# Patient Record
Sex: Male | Born: 1935 | Race: Black or African American | Hispanic: No | Smoking: Former smoker
Health system: Southern US, Community
[De-identification: ages and names within clinical notes are randomized; demographics above are authoritative.]

## PROBLEM LIST (undated history)

## (undated) DIAGNOSIS — N183 Chronic kidney disease, stage 3 unspecified: Secondary | ICD-10-CM

## (undated) DIAGNOSIS — I10 Essential (primary) hypertension: Secondary | ICD-10-CM

## (undated) DIAGNOSIS — E785 Hyperlipidemia, unspecified: Secondary | ICD-10-CM

## (undated) DIAGNOSIS — I340 Nonrheumatic mitral (valve) insufficiency: Secondary | ICD-10-CM

## (undated) DIAGNOSIS — F0390 Unspecified dementia without behavioral disturbance: Secondary | ICD-10-CM

## (undated) DIAGNOSIS — K219 Gastro-esophageal reflux disease without esophagitis: Secondary | ICD-10-CM

## (undated) DIAGNOSIS — D649 Anemia, unspecified: Secondary | ICD-10-CM

## (undated) DIAGNOSIS — E119 Type 2 diabetes mellitus without complications: Secondary | ICD-10-CM

## (undated) HISTORY — DX: Essential (primary) hypertension: I10

## (undated) HISTORY — PX: ENDARTERECTOMY: SHX5162

## (undated) HISTORY — DX: Gastro-esophageal reflux disease without esophagitis: K21.9

## (undated) HISTORY — PX: KNEE SURGERY: SHX244

## (undated) HISTORY — DX: Hyperlipidemia, unspecified: E78.5

## (undated) HISTORY — PX: PARTIAL COLECTOMY: SHX5273

## (undated) HISTORY — DX: Unspecified dementia, unspecified severity, without behavioral disturbance, psychotic disturbance, mood disturbance, and anxiety: F03.90

## (undated) HISTORY — DX: Chronic kidney disease, stage 3 (moderate): N18.3

## (undated) HISTORY — DX: Type 2 diabetes mellitus without complications: E11.9

## (undated) HISTORY — PX: SHOULDER SURGERY: SHX246

## (undated) HISTORY — DX: Chronic kidney disease, stage 3 unspecified: N18.30

## (undated) HISTORY — DX: Anemia, unspecified: D64.9

## (undated) HISTORY — DX: Nonrheumatic mitral (valve) insufficiency: I34.0

---

## 2012-05-27 DIAGNOSIS — C189 Malignant neoplasm of colon, unspecified: Secondary | ICD-10-CM | POA: Diagnosis not present

## 2012-07-26 DIAGNOSIS — E119 Type 2 diabetes mellitus without complications: Secondary | ICD-10-CM | POA: Diagnosis not present

## 2012-07-26 DIAGNOSIS — N189 Chronic kidney disease, unspecified: Secondary | ICD-10-CM | POA: Diagnosis not present

## 2012-07-26 DIAGNOSIS — I10 Essential (primary) hypertension: Secondary | ICD-10-CM | POA: Diagnosis not present

## 2012-07-26 DIAGNOSIS — I6350 Cerebral infarction due to unspecified occlusion or stenosis of unspecified cerebral artery: Secondary | ICD-10-CM | POA: Diagnosis not present

## 2012-09-11 DIAGNOSIS — N189 Chronic kidney disease, unspecified: Secondary | ICD-10-CM | POA: Diagnosis not present

## 2012-09-11 DIAGNOSIS — I10 Essential (primary) hypertension: Secondary | ICD-10-CM | POA: Diagnosis not present

## 2012-09-11 DIAGNOSIS — E119 Type 2 diabetes mellitus without complications: Secondary | ICD-10-CM | POA: Diagnosis not present

## 2012-09-11 DIAGNOSIS — E785 Hyperlipidemia, unspecified: Secondary | ICD-10-CM | POA: Diagnosis not present

## 2012-09-14 DIAGNOSIS — R4182 Altered mental status, unspecified: Secondary | ICD-10-CM | POA: Diagnosis not present

## 2012-09-14 DIAGNOSIS — R0989 Other specified symptoms and signs involving the circulatory and respiratory systems: Secondary | ICD-10-CM | POA: Diagnosis not present

## 2012-09-14 DIAGNOSIS — R7989 Other specified abnormal findings of blood chemistry: Secondary | ICD-10-CM | POA: Diagnosis not present

## 2012-09-15 DIAGNOSIS — I509 Heart failure, unspecified: Secondary | ICD-10-CM | POA: Diagnosis present

## 2012-09-15 DIAGNOSIS — Z794 Long term (current) use of insulin: Secondary | ICD-10-CM | POA: Diagnosis not present

## 2012-09-15 DIAGNOSIS — I517 Cardiomegaly: Secondary | ICD-10-CM | POA: Diagnosis not present

## 2012-09-15 DIAGNOSIS — I10 Essential (primary) hypertension: Secondary | ICD-10-CM | POA: Diagnosis not present

## 2012-09-15 DIAGNOSIS — Z87891 Personal history of nicotine dependence: Secondary | ICD-10-CM | POA: Diagnosis not present

## 2012-09-15 DIAGNOSIS — G9341 Metabolic encephalopathy: Secondary | ICD-10-CM | POA: Diagnosis not present

## 2012-09-15 DIAGNOSIS — Z85038 Personal history of other malignant neoplasm of large intestine: Secondary | ICD-10-CM | POA: Diagnosis not present

## 2012-09-15 DIAGNOSIS — I471 Supraventricular tachycardia: Secondary | ICD-10-CM | POA: Diagnosis not present

## 2012-09-15 DIAGNOSIS — I2510 Atherosclerotic heart disease of native coronary artery without angina pectoris: Secondary | ICD-10-CM | POA: Diagnosis present

## 2012-09-15 DIAGNOSIS — R4701 Aphasia: Secondary | ICD-10-CM | POA: Diagnosis not present

## 2012-09-15 DIAGNOSIS — I248 Other forms of acute ischemic heart disease: Secondary | ICD-10-CM | POA: Diagnosis not present

## 2012-09-15 DIAGNOSIS — I428 Other cardiomyopathies: Secondary | ICD-10-CM | POA: Diagnosis not present

## 2012-09-15 DIAGNOSIS — Z923 Personal history of irradiation: Secondary | ICD-10-CM | POA: Diagnosis not present

## 2012-09-15 DIAGNOSIS — R4182 Altered mental status, unspecified: Secondary | ICD-10-CM | POA: Diagnosis not present

## 2012-09-15 DIAGNOSIS — Z8546 Personal history of malignant neoplasm of prostate: Secondary | ICD-10-CM | POA: Diagnosis not present

## 2012-09-15 DIAGNOSIS — E161 Other hypoglycemia: Secondary | ICD-10-CM | POA: Diagnosis not present

## 2012-09-15 DIAGNOSIS — E785 Hyperlipidemia, unspecified: Secondary | ICD-10-CM | POA: Diagnosis not present

## 2012-09-15 DIAGNOSIS — F0280 Dementia in other diseases classified elsewhere without behavioral disturbance: Secondary | ICD-10-CM | POA: Diagnosis present

## 2012-09-15 DIAGNOSIS — R7309 Other abnormal glucose: Secondary | ICD-10-CM | POA: Diagnosis not present

## 2012-09-15 DIAGNOSIS — I5032 Chronic diastolic (congestive) heart failure: Secondary | ICD-10-CM | POA: Diagnosis present

## 2012-09-15 DIAGNOSIS — I214 Non-ST elevation (NSTEMI) myocardial infarction: Secondary | ICD-10-CM | POA: Diagnosis not present

## 2012-09-15 DIAGNOSIS — E876 Hypokalemia: Secondary | ICD-10-CM | POA: Diagnosis not present

## 2012-09-15 DIAGNOSIS — R0989 Other specified symptoms and signs involving the circulatory and respiratory systems: Secondary | ICD-10-CM | POA: Diagnosis not present

## 2012-09-15 DIAGNOSIS — Z9049 Acquired absence of other specified parts of digestive tract: Secondary | ICD-10-CM | POA: Diagnosis not present

## 2012-09-15 DIAGNOSIS — F0390 Unspecified dementia without behavioral disturbance: Secondary | ICD-10-CM | POA: Diagnosis not present

## 2012-09-15 DIAGNOSIS — R079 Chest pain, unspecified: Secondary | ICD-10-CM | POA: Diagnosis not present

## 2012-09-15 DIAGNOSIS — Z7982 Long term (current) use of aspirin: Secondary | ICD-10-CM | POA: Diagnosis not present

## 2012-09-15 DIAGNOSIS — F29 Unspecified psychosis not due to a substance or known physiological condition: Secondary | ICD-10-CM | POA: Diagnosis not present

## 2012-09-15 DIAGNOSIS — G309 Alzheimer's disease, unspecified: Secondary | ICD-10-CM | POA: Diagnosis not present

## 2012-09-19 DIAGNOSIS — F0390 Unspecified dementia without behavioral disturbance: Secondary | ICD-10-CM | POA: Diagnosis not present

## 2012-09-19 DIAGNOSIS — E119 Type 2 diabetes mellitus without complications: Secondary | ICD-10-CM | POA: Diagnosis not present

## 2012-09-19 DIAGNOSIS — R4182 Altered mental status, unspecified: Secondary | ICD-10-CM | POA: Diagnosis not present

## 2012-09-20 DIAGNOSIS — E119 Type 2 diabetes mellitus without complications: Secondary | ICD-10-CM | POA: Diagnosis not present

## 2012-09-20 DIAGNOSIS — F0390 Unspecified dementia without behavioral disturbance: Secondary | ICD-10-CM | POA: Diagnosis not present

## 2012-09-20 DIAGNOSIS — R4182 Altered mental status, unspecified: Secondary | ICD-10-CM | POA: Diagnosis not present

## 2012-09-21 DIAGNOSIS — F0390 Unspecified dementia without behavioral disturbance: Secondary | ICD-10-CM | POA: Diagnosis not present

## 2012-09-21 DIAGNOSIS — R4182 Altered mental status, unspecified: Secondary | ICD-10-CM | POA: Diagnosis not present

## 2012-09-21 DIAGNOSIS — E119 Type 2 diabetes mellitus without complications: Secondary | ICD-10-CM | POA: Diagnosis not present

## 2012-09-23 DIAGNOSIS — R4182 Altered mental status, unspecified: Secondary | ICD-10-CM | POA: Diagnosis not present

## 2012-09-23 DIAGNOSIS — F0390 Unspecified dementia without behavioral disturbance: Secondary | ICD-10-CM | POA: Diagnosis not present

## 2012-09-23 DIAGNOSIS — E119 Type 2 diabetes mellitus without complications: Secondary | ICD-10-CM | POA: Diagnosis not present

## 2012-09-24 DIAGNOSIS — R4182 Altered mental status, unspecified: Secondary | ICD-10-CM | POA: Diagnosis not present

## 2012-09-24 DIAGNOSIS — E119 Type 2 diabetes mellitus without complications: Secondary | ICD-10-CM | POA: Diagnosis not present

## 2012-09-24 DIAGNOSIS — F0390 Unspecified dementia without behavioral disturbance: Secondary | ICD-10-CM | POA: Diagnosis not present

## 2012-09-26 DIAGNOSIS — E876 Hypokalemia: Secondary | ICD-10-CM | POA: Diagnosis not present

## 2012-09-26 DIAGNOSIS — R4182 Altered mental status, unspecified: Secondary | ICD-10-CM | POA: Diagnosis not present

## 2012-09-26 DIAGNOSIS — R413 Other amnesia: Secondary | ICD-10-CM | POA: Diagnosis not present

## 2012-09-26 DIAGNOSIS — F0390 Unspecified dementia without behavioral disturbance: Secondary | ICD-10-CM | POA: Diagnosis not present

## 2012-09-26 DIAGNOSIS — E559 Vitamin D deficiency, unspecified: Secondary | ICD-10-CM | POA: Diagnosis not present

## 2012-09-26 DIAGNOSIS — E119 Type 2 diabetes mellitus without complications: Secondary | ICD-10-CM | POA: Diagnosis not present

## 2012-09-27 DIAGNOSIS — E119 Type 2 diabetes mellitus without complications: Secondary | ICD-10-CM | POA: Diagnosis not present

## 2012-09-27 DIAGNOSIS — F0390 Unspecified dementia without behavioral disturbance: Secondary | ICD-10-CM | POA: Diagnosis not present

## 2012-09-27 DIAGNOSIS — R4182 Altered mental status, unspecified: Secondary | ICD-10-CM | POA: Diagnosis not present

## 2012-09-30 DIAGNOSIS — R4182 Altered mental status, unspecified: Secondary | ICD-10-CM | POA: Diagnosis not present

## 2012-09-30 DIAGNOSIS — F0390 Unspecified dementia without behavioral disturbance: Secondary | ICD-10-CM | POA: Diagnosis not present

## 2012-09-30 DIAGNOSIS — E119 Type 2 diabetes mellitus without complications: Secondary | ICD-10-CM | POA: Diagnosis not present

## 2012-10-01 DIAGNOSIS — R413 Other amnesia: Secondary | ICD-10-CM | POA: Diagnosis not present

## 2012-10-01 DIAGNOSIS — E876 Hypokalemia: Secondary | ICD-10-CM | POA: Diagnosis not present

## 2012-10-01 DIAGNOSIS — E559 Vitamin D deficiency, unspecified: Secondary | ICD-10-CM | POA: Diagnosis not present

## 2012-10-01 DIAGNOSIS — E119 Type 2 diabetes mellitus without complications: Secondary | ICD-10-CM | POA: Diagnosis not present

## 2012-10-02 DIAGNOSIS — F0390 Unspecified dementia without behavioral disturbance: Secondary | ICD-10-CM | POA: Diagnosis not present

## 2012-10-02 DIAGNOSIS — E119 Type 2 diabetes mellitus without complications: Secondary | ICD-10-CM | POA: Diagnosis not present

## 2012-10-02 DIAGNOSIS — R4182 Altered mental status, unspecified: Secondary | ICD-10-CM | POA: Diagnosis not present

## 2012-10-03 DIAGNOSIS — R4182 Altered mental status, unspecified: Secondary | ICD-10-CM | POA: Diagnosis not present

## 2012-10-03 DIAGNOSIS — F0390 Unspecified dementia without behavioral disturbance: Secondary | ICD-10-CM | POA: Diagnosis not present

## 2012-10-03 DIAGNOSIS — E119 Type 2 diabetes mellitus without complications: Secondary | ICD-10-CM | POA: Diagnosis not present

## 2012-10-05 DIAGNOSIS — Z85038 Personal history of other malignant neoplasm of large intestine: Secondary | ICD-10-CM | POA: Diagnosis not present

## 2012-10-05 DIAGNOSIS — Z87891 Personal history of nicotine dependence: Secondary | ICD-10-CM | POA: Diagnosis not present

## 2012-10-05 DIAGNOSIS — I214 Non-ST elevation (NSTEMI) myocardial infarction: Secondary | ICD-10-CM | POA: Diagnosis not present

## 2012-10-05 DIAGNOSIS — I129 Hypertensive chronic kidney disease with stage 1 through stage 4 chronic kidney disease, or unspecified chronic kidney disease: Secondary | ICD-10-CM | POA: Diagnosis present

## 2012-10-05 DIAGNOSIS — E1165 Type 2 diabetes mellitus with hyperglycemia: Secondary | ICD-10-CM | POA: Diagnosis present

## 2012-10-05 DIAGNOSIS — Z8546 Personal history of malignant neoplasm of prostate: Secondary | ICD-10-CM | POA: Diagnosis not present

## 2012-10-05 DIAGNOSIS — D638 Anemia in other chronic diseases classified elsewhere: Secondary | ICD-10-CM | POA: Diagnosis not present

## 2012-10-05 DIAGNOSIS — R112 Nausea with vomiting, unspecified: Secondary | ICD-10-CM | POA: Diagnosis not present

## 2012-10-05 DIAGNOSIS — Z8249 Family history of ischemic heart disease and other diseases of the circulatory system: Secondary | ICD-10-CM | POA: Diagnosis not present

## 2012-10-05 DIAGNOSIS — E785 Hyperlipidemia, unspecified: Secondary | ICD-10-CM | POA: Diagnosis not present

## 2012-10-05 DIAGNOSIS — Z9049 Acquired absence of other specified parts of digestive tract: Secondary | ICD-10-CM | POA: Diagnosis not present

## 2012-10-05 DIAGNOSIS — Z8614 Personal history of Methicillin resistant Staphylococcus aureus infection: Secondary | ICD-10-CM | POA: Diagnosis not present

## 2012-10-05 DIAGNOSIS — I4891 Unspecified atrial fibrillation: Secondary | ICD-10-CM | POA: Diagnosis not present

## 2012-10-05 DIAGNOSIS — I428 Other cardiomyopathies: Secondary | ICD-10-CM | POA: Diagnosis not present

## 2012-10-05 DIAGNOSIS — E876 Hypokalemia: Secondary | ICD-10-CM | POA: Diagnosis present

## 2012-10-05 DIAGNOSIS — I498 Other specified cardiac arrhythmias: Secondary | ICD-10-CM | POA: Diagnosis not present

## 2012-10-05 DIAGNOSIS — I200 Unstable angina: Secondary | ICD-10-CM | POA: Diagnosis not present

## 2012-10-05 DIAGNOSIS — I495 Sick sinus syndrome: Secondary | ICD-10-CM | POA: Diagnosis not present

## 2012-10-05 DIAGNOSIS — I248 Other forms of acute ischemic heart disease: Secondary | ICD-10-CM | POA: Diagnosis not present

## 2012-10-05 DIAGNOSIS — I509 Heart failure, unspecified: Secondary | ICD-10-CM | POA: Diagnosis not present

## 2012-10-05 DIAGNOSIS — N179 Acute kidney failure, unspecified: Secondary | ICD-10-CM | POA: Diagnosis not present

## 2012-10-05 DIAGNOSIS — N189 Chronic kidney disease, unspecified: Secondary | ICD-10-CM | POA: Diagnosis present

## 2012-10-05 DIAGNOSIS — I219 Acute myocardial infarction, unspecified: Secondary | ICD-10-CM | POA: Diagnosis not present

## 2012-10-05 DIAGNOSIS — I2489 Other forms of acute ischemic heart disease: Secondary | ICD-10-CM | POA: Diagnosis not present

## 2012-10-05 DIAGNOSIS — R262 Difficulty in walking, not elsewhere classified: Secondary | ICD-10-CM | POA: Diagnosis not present

## 2012-10-05 DIAGNOSIS — D72829 Elevated white blood cell count, unspecified: Secondary | ICD-10-CM | POA: Diagnosis present

## 2012-10-05 DIAGNOSIS — R42 Dizziness and giddiness: Secondary | ICD-10-CM | POA: Diagnosis not present

## 2012-10-05 DIAGNOSIS — R4182 Altered mental status, unspecified: Secondary | ICD-10-CM | POA: Diagnosis not present

## 2012-10-05 DIAGNOSIS — E860 Dehydration: Secondary | ICD-10-CM | POA: Diagnosis not present

## 2012-10-11 DIAGNOSIS — M6281 Muscle weakness (generalized): Secondary | ICD-10-CM | POA: Diagnosis not present

## 2012-10-11 DIAGNOSIS — R262 Difficulty in walking, not elsewhere classified: Secondary | ICD-10-CM | POA: Diagnosis not present

## 2012-10-11 DIAGNOSIS — E119 Type 2 diabetes mellitus without complications: Secondary | ICD-10-CM | POA: Diagnosis not present

## 2012-10-11 DIAGNOSIS — I2489 Other forms of acute ischemic heart disease: Secondary | ICD-10-CM | POA: Diagnosis not present

## 2012-10-11 DIAGNOSIS — R413 Other amnesia: Secondary | ICD-10-CM | POA: Diagnosis not present

## 2012-10-11 DIAGNOSIS — I4891 Unspecified atrial fibrillation: Secondary | ICD-10-CM | POA: Diagnosis not present

## 2012-10-11 DIAGNOSIS — E785 Hyperlipidemia, unspecified: Secondary | ICD-10-CM | POA: Diagnosis not present

## 2012-10-11 DIAGNOSIS — I214 Non-ST elevation (NSTEMI) myocardial infarction: Secondary | ICD-10-CM | POA: Diagnosis not present

## 2012-10-11 DIAGNOSIS — I248 Other forms of acute ischemic heart disease: Secondary | ICD-10-CM | POA: Diagnosis not present

## 2012-10-11 DIAGNOSIS — I10 Essential (primary) hypertension: Secondary | ICD-10-CM | POA: Diagnosis not present

## 2012-10-11 DIAGNOSIS — I2510 Atherosclerotic heart disease of native coronary artery without angina pectoris: Secondary | ICD-10-CM | POA: Diagnosis not present

## 2012-10-11 DIAGNOSIS — R112 Nausea with vomiting, unspecified: Secondary | ICD-10-CM | POA: Diagnosis not present

## 2012-10-11 DIAGNOSIS — R4182 Altered mental status, unspecified: Secondary | ICD-10-CM | POA: Diagnosis not present

## 2012-10-11 DIAGNOSIS — I428 Other cardiomyopathies: Secondary | ICD-10-CM | POA: Diagnosis not present

## 2012-10-11 DIAGNOSIS — E162 Hypoglycemia, unspecified: Secondary | ICD-10-CM | POA: Diagnosis not present

## 2012-10-15 DIAGNOSIS — I4891 Unspecified atrial fibrillation: Secondary | ICD-10-CM | POA: Diagnosis not present

## 2012-10-15 DIAGNOSIS — I10 Essential (primary) hypertension: Secondary | ICD-10-CM | POA: Diagnosis not present

## 2012-10-15 DIAGNOSIS — I428 Other cardiomyopathies: Secondary | ICD-10-CM | POA: Diagnosis not present

## 2012-10-15 DIAGNOSIS — E119 Type 2 diabetes mellitus without complications: Secondary | ICD-10-CM | POA: Diagnosis not present

## 2012-10-22 DIAGNOSIS — I2510 Atherosclerotic heart disease of native coronary artery without angina pectoris: Secondary | ICD-10-CM | POA: Diagnosis not present

## 2012-10-22 DIAGNOSIS — I10 Essential (primary) hypertension: Secondary | ICD-10-CM | POA: Diagnosis not present

## 2012-10-22 DIAGNOSIS — I4891 Unspecified atrial fibrillation: Secondary | ICD-10-CM | POA: Diagnosis not present

## 2012-10-24 DIAGNOSIS — E162 Hypoglycemia, unspecified: Secondary | ICD-10-CM | POA: Diagnosis not present

## 2012-10-24 DIAGNOSIS — I4891 Unspecified atrial fibrillation: Secondary | ICD-10-CM | POA: Diagnosis not present

## 2012-10-31 DIAGNOSIS — I10 Essential (primary) hypertension: Secondary | ICD-10-CM | POA: Diagnosis not present

## 2012-10-31 DIAGNOSIS — R413 Other amnesia: Secondary | ICD-10-CM | POA: Diagnosis not present

## 2012-11-07 DIAGNOSIS — D649 Anemia, unspecified: Secondary | ICD-10-CM | POA: Diagnosis not present

## 2012-11-07 DIAGNOSIS — E119 Type 2 diabetes mellitus without complications: Secondary | ICD-10-CM | POA: Diagnosis not present

## 2012-11-07 DIAGNOSIS — I129 Hypertensive chronic kidney disease with stage 1 through stage 4 chronic kidney disease, or unspecified chronic kidney disease: Secondary | ICD-10-CM | POA: Diagnosis not present

## 2012-11-07 DIAGNOSIS — I5022 Chronic systolic (congestive) heart failure: Secondary | ICD-10-CM | POA: Diagnosis not present

## 2012-11-07 DIAGNOSIS — Z23 Encounter for immunization: Secondary | ICD-10-CM | POA: Diagnosis not present

## 2012-11-07 DIAGNOSIS — E109 Type 1 diabetes mellitus without complications: Secondary | ICD-10-CM | POA: Diagnosis not present

## 2012-11-07 DIAGNOSIS — I10 Essential (primary) hypertension: Secondary | ICD-10-CM | POA: Diagnosis not present

## 2012-11-07 DIAGNOSIS — N189 Chronic kidney disease, unspecified: Secondary | ICD-10-CM | POA: Diagnosis not present

## 2012-11-07 DIAGNOSIS — I5020 Unspecified systolic (congestive) heart failure: Secondary | ICD-10-CM | POA: Diagnosis not present

## 2012-11-07 DIAGNOSIS — F0280 Dementia in other diseases classified elsewhere without behavioral disturbance: Secondary | ICD-10-CM | POA: Diagnosis not present

## 2012-11-07 DIAGNOSIS — I4891 Unspecified atrial fibrillation: Secondary | ICD-10-CM | POA: Diagnosis not present

## 2012-11-07 DIAGNOSIS — Z79899 Other long term (current) drug therapy: Secondary | ICD-10-CM | POA: Diagnosis not present

## 2012-11-07 DIAGNOSIS — Z0000 Encounter for general adult medical examination without abnormal findings: Secondary | ICD-10-CM | POA: Diagnosis not present

## 2012-11-22 DIAGNOSIS — Z1389 Encounter for screening for other disorder: Secondary | ICD-10-CM | POA: Diagnosis not present

## 2012-11-22 DIAGNOSIS — Z136 Encounter for screening for cardiovascular disorders: Secondary | ICD-10-CM | POA: Diagnosis not present

## 2012-11-22 DIAGNOSIS — I10 Essential (primary) hypertension: Secondary | ICD-10-CM | POA: Diagnosis not present

## 2012-11-22 DIAGNOSIS — E119 Type 2 diabetes mellitus without complications: Secondary | ICD-10-CM | POA: Diagnosis not present

## 2012-11-22 DIAGNOSIS — E785 Hyperlipidemia, unspecified: Secondary | ICD-10-CM | POA: Diagnosis not present

## 2012-12-20 ENCOUNTER — Emergency Department: Payer: Self-pay | Admitting: Emergency Medicine

## 2012-12-20 DIAGNOSIS — R040 Epistaxis: Secondary | ICD-10-CM | POA: Diagnosis not present

## 2012-12-20 DIAGNOSIS — I509 Heart failure, unspecified: Secondary | ICD-10-CM | POA: Diagnosis not present

## 2012-12-20 DIAGNOSIS — Z7982 Long term (current) use of aspirin: Secondary | ICD-10-CM | POA: Diagnosis not present

## 2012-12-20 DIAGNOSIS — I519 Heart disease, unspecified: Secondary | ICD-10-CM | POA: Diagnosis not present

## 2012-12-20 DIAGNOSIS — I517 Cardiomegaly: Secondary | ICD-10-CM | POA: Diagnosis not present

## 2012-12-20 DIAGNOSIS — I5020 Unspecified systolic (congestive) heart failure: Secondary | ICD-10-CM | POA: Diagnosis not present

## 2012-12-20 DIAGNOSIS — I10 Essential (primary) hypertension: Secondary | ICD-10-CM | POA: Diagnosis not present

## 2012-12-20 DIAGNOSIS — E119 Type 2 diabetes mellitus without complications: Secondary | ICD-10-CM | POA: Diagnosis not present

## 2013-01-08 DIAGNOSIS — I10 Essential (primary) hypertension: Secondary | ICD-10-CM | POA: Diagnosis not present

## 2013-01-08 DIAGNOSIS — E119 Type 2 diabetes mellitus without complications: Secondary | ICD-10-CM | POA: Diagnosis not present

## 2013-01-08 DIAGNOSIS — I4891 Unspecified atrial fibrillation: Secondary | ICD-10-CM | POA: Diagnosis not present

## 2013-01-08 DIAGNOSIS — R0609 Other forms of dyspnea: Secondary | ICD-10-CM | POA: Diagnosis not present

## 2013-01-09 ENCOUNTER — Inpatient Hospital Stay: Payer: Self-pay | Admitting: Internal Medicine

## 2013-01-09 DIAGNOSIS — R7881 Bacteremia: Secondary | ICD-10-CM | POA: Diagnosis not present

## 2013-01-09 DIAGNOSIS — D72829 Elevated white blood cell count, unspecified: Secondary | ICD-10-CM | POA: Diagnosis not present

## 2013-01-09 DIAGNOSIS — R197 Diarrhea, unspecified: Secondary | ICD-10-CM | POA: Diagnosis not present

## 2013-01-09 DIAGNOSIS — N183 Chronic kidney disease, stage 3 unspecified: Secondary | ICD-10-CM | POA: Diagnosis not present

## 2013-01-09 DIAGNOSIS — R748 Abnormal levels of other serum enzymes: Secondary | ICD-10-CM | POA: Diagnosis not present

## 2013-01-09 DIAGNOSIS — E876 Hypokalemia: Secondary | ICD-10-CM | POA: Diagnosis not present

## 2013-01-09 DIAGNOSIS — I4891 Unspecified atrial fibrillation: Secondary | ICD-10-CM | POA: Diagnosis not present

## 2013-01-09 DIAGNOSIS — Z9049 Acquired absence of other specified parts of digestive tract: Secondary | ICD-10-CM | POA: Diagnosis not present

## 2013-01-09 DIAGNOSIS — E119 Type 2 diabetes mellitus without complications: Secondary | ICD-10-CM | POA: Diagnosis present

## 2013-01-09 DIAGNOSIS — I472 Ventricular tachycardia: Secondary | ICD-10-CM | POA: Diagnosis not present

## 2013-01-09 DIAGNOSIS — I10 Essential (primary) hypertension: Secondary | ICD-10-CM | POA: Diagnosis not present

## 2013-01-09 DIAGNOSIS — R933 Abnormal findings on diagnostic imaging of other parts of digestive tract: Secondary | ICD-10-CM | POA: Diagnosis not present

## 2013-01-09 DIAGNOSIS — Z833 Family history of diabetes mellitus: Secondary | ICD-10-CM | POA: Diagnosis not present

## 2013-01-09 DIAGNOSIS — K769 Liver disease, unspecified: Secondary | ICD-10-CM | POA: Diagnosis not present

## 2013-01-09 DIAGNOSIS — R55 Syncope and collapse: Secondary | ICD-10-CM | POA: Diagnosis not present

## 2013-01-09 DIAGNOSIS — N400 Enlarged prostate without lower urinary tract symptoms: Secondary | ICD-10-CM | POA: Diagnosis present

## 2013-01-09 DIAGNOSIS — K763 Infarction of liver: Secondary | ICD-10-CM | POA: Diagnosis not present

## 2013-01-09 DIAGNOSIS — I129 Hypertensive chronic kidney disease with stage 1 through stage 4 chronic kidney disease, or unspecified chronic kidney disease: Secondary | ICD-10-CM | POA: Diagnosis present

## 2013-01-09 DIAGNOSIS — E785 Hyperlipidemia, unspecified: Secondary | ICD-10-CM | POA: Diagnosis not present

## 2013-01-09 DIAGNOSIS — K828 Other specified diseases of gallbladder: Secondary | ICD-10-CM | POA: Diagnosis not present

## 2013-01-09 DIAGNOSIS — Z7902 Long term (current) use of antithrombotics/antiplatelets: Secondary | ICD-10-CM | POA: Diagnosis not present

## 2013-01-09 DIAGNOSIS — IMO0002 Reserved for concepts with insufficient information to code with codable children: Secondary | ICD-10-CM | POA: Diagnosis present

## 2013-01-09 DIAGNOSIS — R809 Proteinuria, unspecified: Secondary | ICD-10-CM | POA: Diagnosis not present

## 2013-01-09 DIAGNOSIS — Z794 Long term (current) use of insulin: Secondary | ICD-10-CM | POA: Diagnosis not present

## 2013-01-09 DIAGNOSIS — E875 Hyperkalemia: Secondary | ICD-10-CM | POA: Diagnosis not present

## 2013-01-09 DIAGNOSIS — N179 Acute kidney failure, unspecified: Secondary | ICD-10-CM | POA: Diagnosis not present

## 2013-01-09 DIAGNOSIS — R7989 Other specified abnormal findings of blood chemistry: Secondary | ICD-10-CM | POA: Diagnosis not present

## 2013-01-09 DIAGNOSIS — N19 Unspecified kidney failure: Secondary | ICD-10-CM | POA: Diagnosis not present

## 2013-01-09 DIAGNOSIS — Z7982 Long term (current) use of aspirin: Secondary | ICD-10-CM | POA: Diagnosis not present

## 2013-01-09 DIAGNOSIS — R066 Hiccough: Secondary | ICD-10-CM | POA: Diagnosis not present

## 2013-01-09 LAB — CBC
HCT: 39 % — ABNORMAL LOW (ref 40.0–52.0)
HGB: 13.2 g/dL (ref 13.0–18.0)
MCV: 89 fL (ref 80–100)
Platelet: 206 10*3/uL (ref 150–440)

## 2013-01-09 LAB — CK TOTAL AND CKMB (NOT AT ARMC)
CK, Total: 231 U/L (ref 35–232)
CK-MB: 1 ng/mL (ref 0.5–3.6)
CK-MB: 1.5 ng/mL (ref 0.5–3.6)

## 2013-01-09 LAB — URINALYSIS, COMPLETE
Bilirubin,UR: NEGATIVE
Glucose,UR: 50 mg/dL (ref 0–75)
Leukocyte Esterase: NEGATIVE
Protein: >=500
RBC,UR: 2 /HPF (ref 0–5)
Specific Gravity: 1.014 (ref 1.003–1.030)

## 2013-01-09 LAB — DRUG SCREEN, URINE
Barbiturates, Ur Screen: NEGATIVE (ref ?–200)
Phencyclidine (PCP) Ur S: NEGATIVE (ref ?–25)
Tricyclic, Ur Screen: NEGATIVE (ref ?–1000)

## 2013-01-09 LAB — COMPREHENSIVE METABOLIC PANEL
Albumin: 2.9 g/dL — ABNORMAL LOW (ref 3.4–5.0)
BUN: 20 mg/dL — ABNORMAL HIGH (ref 7–18)
Calcium, Total: 8.7 mg/dL (ref 8.5–10.1)
Creatinine: 1.96 mg/dL — ABNORMAL HIGH (ref 0.60–1.30)
Glucose: 199 mg/dL — ABNORMAL HIGH (ref 65–99)
Osmolality: 284 (ref 275–301)
SGPT (ALT): 1386 U/L — ABNORMAL HIGH (ref 12–78)
Sodium: 138 mmol/L (ref 136–145)
Total Protein: 6.9 g/dL (ref 6.4–8.2)

## 2013-01-09 LAB — BASIC METABOLIC PANEL
Anion Gap: 7 (ref 7–16)
BUN: 19 mg/dL — ABNORMAL HIGH (ref 7–18)
Chloride: 103 mmol/L (ref 98–107)
Co2: 28 mmol/L (ref 21–32)
Creatinine: 2.04 mg/dL — ABNORMAL HIGH (ref 0.60–1.30)
EGFR (African American): 36 — ABNORMAL LOW
EGFR (Non-African Amer.): 31 — ABNORMAL LOW
Glucose: 182 mg/dL — ABNORMAL HIGH (ref 65–99)

## 2013-01-09 LAB — IRON AND TIBC
Iron Bind.Cap.(Total): 245 ug/dL — ABNORMAL LOW (ref 250–450)
Iron: 29 ug/dL — ABNORMAL LOW (ref 65–175)

## 2013-01-09 LAB — PROTIME-INR: INR: 1.4

## 2013-01-09 LAB — ACETAMINOPHEN LEVEL: Acetaminophen: <2 ug/mL

## 2013-01-09 LAB — MAGNESIUM: Magnesium: 1.8 mg/dL

## 2013-01-09 LAB — TROPONIN I: Troponin-I: 0.21 ng/mL — ABNORMAL HIGH

## 2013-01-09 LAB — APTT: Activated PTT: 39.7 secs — ABNORMAL HIGH (ref 23.6–35.9)

## 2013-01-10 LAB — HEPATIC FUNCTION PANEL A (ARMC)
Albumin: 2.5 g/dL — ABNORMAL LOW (ref 3.4–5.0)
Bilirubin, Direct: 2.5 mg/dL — ABNORMAL HIGH (ref 0.00–0.20)
Bilirubin,Total: 3.6 mg/dL — ABNORMAL HIGH (ref 0.2–1.0)
SGOT(AST): 971 U/L — ABNORMAL HIGH (ref 15–37)
SGPT (ALT): 809 U/L — ABNORMAL HIGH (ref 12–78)
Total Protein: 6.2 g/dL — ABNORMAL LOW (ref 6.4–8.2)

## 2013-01-10 LAB — CBC WITH DIFFERENTIAL/PLATELET
Basophil %: 0.1 %
HGB: 12.9 g/dL — ABNORMAL LOW (ref 13.0–18.0)
Lymphocyte %: 2.2 %
MCH: 30.3 pg (ref 26.0–34.0)
MCHC: 34 g/dL (ref 32.0–36.0)
MCV: 89 fL (ref 80–100)
Monocyte %: 5.4 %
Neutrophil %: 92.3 %
Platelet: 199 10*3/uL (ref 150–440)
RBC: 4.25 10*6/uL — ABNORMAL LOW (ref 4.40–5.90)
RDW: 14.3 % (ref 11.5–14.5)

## 2013-01-10 LAB — BASIC METABOLIC PANEL
BUN: 18 mg/dL (ref 7–18)
Calcium, Total: 8.2 mg/dL — ABNORMAL LOW (ref 8.5–10.1)
Creatinine: 1.94 mg/dL — ABNORMAL HIGH (ref 0.60–1.30)
EGFR (African American): 38 — ABNORMAL LOW
EGFR (Non-African Amer.): 33 — ABNORMAL LOW
Osmolality: 279 (ref 275–301)
Sodium: 138 mmol/L (ref 136–145)

## 2013-01-10 LAB — TROPONIN I: Troponin-I: 0.4 ng/mL — ABNORMAL HIGH

## 2013-01-10 LAB — LIPID PANEL
HDL Cholesterol: 34 mg/dL — ABNORMAL LOW (ref 40–60)
Triglycerides: 61 mg/dL (ref 0–200)
VLDL Cholesterol, Calc: 12 mg/dL (ref 5–40)

## 2013-01-10 LAB — CK TOTAL AND CKMB (NOT AT ARMC)
CK, Total: 283 U/L — ABNORMAL HIGH (ref 35–232)
CK-MB: 1.3 ng/mL (ref 0.5–3.6)

## 2013-01-10 LAB — MAGNESIUM: Magnesium: 2.2 mg/dL

## 2013-01-11 LAB — CBC WITH DIFFERENTIAL/PLATELET
Basophil %: 0.1 %
Eosinophil #: 0 10*3/uL (ref 0.0–0.7)
Eosinophil %: 0.3 %
HCT: 35 % — ABNORMAL LOW (ref 40.0–52.0)
Lymphocyte #: 0.3 10*3/uL — ABNORMAL LOW (ref 1.0–3.6)
Lymphocyte %: 3.4 %
MCH: 30.7 pg (ref 26.0–34.0)
Neutrophil #: 8.2 10*3/uL — ABNORMAL HIGH (ref 1.4–6.5)
Neutrophil %: 89 %
RBC: 3.92 10*6/uL — ABNORMAL LOW (ref 4.40–5.90)
RDW: 14.8 % — ABNORMAL HIGH (ref 11.5–14.5)

## 2013-01-11 LAB — COMPREHENSIVE METABOLIC PANEL
Albumin: 2.2 g/dL — ABNORMAL LOW (ref 3.4–5.0)
Anion Gap: 9 (ref 7–16)
Calcium, Total: 8.1 mg/dL — ABNORMAL LOW (ref 8.5–10.1)
Creatinine: 2.24 mg/dL — ABNORMAL HIGH (ref 0.60–1.30)
Glucose: 130 mg/dL — ABNORMAL HIGH (ref 65–99)
Osmolality: 279 (ref 275–301)
Potassium: 2.9 mmol/L — ABNORMAL LOW (ref 3.5–5.1)
Sodium: 138 mmol/L (ref 136–145)
Total Protein: 6 g/dL — ABNORMAL LOW (ref 6.4–8.2)

## 2013-01-11 LAB — PROTEIN / CREATININE RATIO, URINE
Creatinine, Urine: 62.3 mg/dL (ref 30.0–125.0)
Protein, Random Urine: 176 mg/dL — ABNORMAL HIGH (ref 0–12)
Protein/Creat. Ratio: 2825 mg/gCREAT — ABNORMAL HIGH (ref 0–200)

## 2013-01-11 LAB — PHOSPHORUS: Phosphorus: 2.2 mg/dL — ABNORMAL LOW (ref 2.5–4.9)

## 2013-01-11 LAB — PROTIME-INR: Prothrombin Time: 15.4 secs — ABNORMAL HIGH (ref 11.5–14.7)

## 2013-01-11 LAB — URINE CULTURE

## 2013-01-11 LAB — POTASSIUM: Potassium: 3.3 mmol/L — ABNORMAL LOW (ref 3.5–5.1)

## 2013-01-11 LAB — CK: CK, Total: 245 U/L — ABNORMAL HIGH (ref 35–232)

## 2013-01-11 LAB — MAGNESIUM: Magnesium: 2.3 mg/dL

## 2013-01-12 LAB — BASIC METABOLIC PANEL
Anion Gap: 11 (ref 7–16)
Co2: 19 mmol/L — ABNORMAL LOW (ref 21–32)
Glucose: 191 mg/dL — ABNORMAL HIGH (ref 65–99)
Sodium: 134 mmol/L — ABNORMAL LOW (ref 136–145)

## 2013-01-13 LAB — COMPREHENSIVE METABOLIC PANEL
Albumin: 2.2 g/dL — ABNORMAL LOW (ref 3.4–5.0)
Alkaline Phosphatase: 446 U/L — ABNORMAL HIGH (ref 50–136)
Anion Gap: 7 (ref 7–16)
Calcium, Total: 8.5 mg/dL (ref 8.5–10.1)
Chloride: 106 mmol/L (ref 98–107)
EGFR (Non-African Amer.): 33 — ABNORMAL LOW
Glucose: 200 mg/dL — ABNORMAL HIGH (ref 65–99)
Potassium: 4 mmol/L (ref 3.5–5.1)
SGOT(AST): 138 U/L — ABNORMAL HIGH (ref 15–37)
SGPT (ALT): 232 U/L — ABNORMAL HIGH (ref 12–78)
Sodium: 136 mmol/L (ref 136–145)

## 2013-01-13 LAB — BASIC METABOLIC PANEL
Anion Gap: 10 (ref 7–16)
BUN: 12 mg/dL (ref 7–18)
Co2: 20 mmol/L — ABNORMAL LOW (ref 21–32)
Creatinine: 1.74 mg/dL — ABNORMAL HIGH (ref 0.60–1.30)
EGFR (African American): 43 — ABNORMAL LOW
EGFR (Non-African Amer.): 38 — ABNORMAL LOW
Osmolality: 273 (ref 275–301)
Sodium: 134 mmol/L — ABNORMAL LOW (ref 136–145)

## 2013-01-13 LAB — MAGNESIUM: Magnesium: 2.2 mg/dL

## 2013-01-14 LAB — CULTURE, BLOOD (SINGLE)

## 2013-01-27 DIAGNOSIS — D631 Anemia in chronic kidney disease: Secondary | ICD-10-CM | POA: Diagnosis not present

## 2013-01-27 DIAGNOSIS — N183 Chronic kidney disease, stage 3 unspecified: Secondary | ICD-10-CM | POA: Diagnosis not present

## 2013-01-27 DIAGNOSIS — I10 Essential (primary) hypertension: Secondary | ICD-10-CM | POA: Diagnosis not present

## 2013-01-27 DIAGNOSIS — I4891 Unspecified atrial fibrillation: Secondary | ICD-10-CM | POA: Diagnosis not present

## 2013-01-27 DIAGNOSIS — E119 Type 2 diabetes mellitus without complications: Secondary | ICD-10-CM | POA: Diagnosis not present

## 2013-01-27 DIAGNOSIS — N2581 Secondary hyperparathyroidism of renal origin: Secondary | ICD-10-CM | POA: Diagnosis not present

## 2013-01-27 DIAGNOSIS — E782 Mixed hyperlipidemia: Secondary | ICD-10-CM | POA: Diagnosis not present

## 2013-01-27 DIAGNOSIS — N179 Acute kidney failure, unspecified: Secondary | ICD-10-CM | POA: Diagnosis not present

## 2013-01-28 DIAGNOSIS — I4891 Unspecified atrial fibrillation: Secondary | ICD-10-CM | POA: Diagnosis not present

## 2013-01-28 DIAGNOSIS — Z23 Encounter for immunization: Secondary | ICD-10-CM | POA: Diagnosis not present

## 2013-01-28 DIAGNOSIS — I10 Essential (primary) hypertension: Secondary | ICD-10-CM | POA: Diagnosis not present

## 2013-01-28 DIAGNOSIS — E119 Type 2 diabetes mellitus without complications: Secondary | ICD-10-CM | POA: Diagnosis not present

## 2013-02-13 DIAGNOSIS — E1149 Type 2 diabetes mellitus with other diabetic neurological complication: Secondary | ICD-10-CM | POA: Diagnosis not present

## 2013-02-13 DIAGNOSIS — E1142 Type 2 diabetes mellitus with diabetic polyneuropathy: Secondary | ICD-10-CM | POA: Diagnosis not present

## 2013-02-13 DIAGNOSIS — I672 Cerebral atherosclerosis: Secondary | ICD-10-CM | POA: Diagnosis not present

## 2013-02-13 DIAGNOSIS — F0150 Vascular dementia without behavioral disturbance: Secondary | ICD-10-CM | POA: Diagnosis not present

## 2013-02-20 DIAGNOSIS — N179 Acute kidney failure, unspecified: Secondary | ICD-10-CM | POA: Diagnosis not present

## 2013-02-20 DIAGNOSIS — I4891 Unspecified atrial fibrillation: Secondary | ICD-10-CM | POA: Diagnosis not present

## 2013-02-20 DIAGNOSIS — E876 Hypokalemia: Secondary | ICD-10-CM | POA: Diagnosis not present

## 2013-02-20 DIAGNOSIS — I10 Essential (primary) hypertension: Secondary | ICD-10-CM | POA: Diagnosis not present

## 2013-02-20 DIAGNOSIS — R809 Proteinuria, unspecified: Secondary | ICD-10-CM | POA: Diagnosis not present

## 2013-02-28 DIAGNOSIS — M898X9 Other specified disorders of bone, unspecified site: Secondary | ICD-10-CM | POA: Diagnosis not present

## 2013-02-28 DIAGNOSIS — M2040 Other hammer toe(s) (acquired), unspecified foot: Secondary | ICD-10-CM | POA: Diagnosis not present

## 2013-02-28 DIAGNOSIS — B351 Tinea unguium: Secondary | ICD-10-CM | POA: Diagnosis not present

## 2013-02-28 DIAGNOSIS — M79609 Pain in unspecified limb: Secondary | ICD-10-CM | POA: Diagnosis not present

## 2013-02-28 DIAGNOSIS — E119 Type 2 diabetes mellitus without complications: Secondary | ICD-10-CM | POA: Diagnosis not present

## 2013-03-13 DIAGNOSIS — E1329 Other specified diabetes mellitus with other diabetic kidney complication: Secondary | ICD-10-CM | POA: Diagnosis not present

## 2013-03-13 DIAGNOSIS — E1129 Type 2 diabetes mellitus with other diabetic kidney complication: Secondary | ICD-10-CM | POA: Diagnosis not present

## 2013-03-13 DIAGNOSIS — Z23 Encounter for immunization: Secondary | ICD-10-CM | POA: Diagnosis not present

## 2013-03-13 DIAGNOSIS — N058 Unspecified nephritic syndrome with other morphologic changes: Secondary | ICD-10-CM | POA: Diagnosis not present

## 2013-03-13 DIAGNOSIS — Z1331 Encounter for screening for depression: Secondary | ICD-10-CM | POA: Diagnosis not present

## 2013-03-13 DIAGNOSIS — I700 Atherosclerosis of aorta: Secondary | ICD-10-CM | POA: Diagnosis not present

## 2013-03-25 DIAGNOSIS — E876 Hypokalemia: Secondary | ICD-10-CM | POA: Diagnosis not present

## 2013-03-25 DIAGNOSIS — N179 Acute kidney failure, unspecified: Secondary | ICD-10-CM | POA: Diagnosis not present

## 2013-03-25 DIAGNOSIS — I10 Essential (primary) hypertension: Secondary | ICD-10-CM | POA: Diagnosis not present

## 2013-03-28 DIAGNOSIS — E119 Type 2 diabetes mellitus without complications: Secondary | ICD-10-CM | POA: Diagnosis not present

## 2013-05-14 DIAGNOSIS — E782 Mixed hyperlipidemia: Secondary | ICD-10-CM | POA: Diagnosis not present

## 2013-05-14 DIAGNOSIS — E119 Type 2 diabetes mellitus without complications: Secondary | ICD-10-CM | POA: Diagnosis not present

## 2013-05-14 DIAGNOSIS — I10 Essential (primary) hypertension: Secondary | ICD-10-CM | POA: Diagnosis not present

## 2013-05-19 DIAGNOSIS — I10 Essential (primary) hypertension: Secondary | ICD-10-CM | POA: Diagnosis not present

## 2013-05-19 DIAGNOSIS — E785 Hyperlipidemia, unspecified: Secondary | ICD-10-CM | POA: Diagnosis not present

## 2013-05-19 DIAGNOSIS — E1129 Type 2 diabetes mellitus with other diabetic kidney complication: Secondary | ICD-10-CM | POA: Diagnosis not present

## 2013-05-22 DIAGNOSIS — E785 Hyperlipidemia, unspecified: Secondary | ICD-10-CM | POA: Diagnosis not present

## 2013-05-22 DIAGNOSIS — E1129 Type 2 diabetes mellitus with other diabetic kidney complication: Secondary | ICD-10-CM | POA: Diagnosis not present

## 2013-05-22 DIAGNOSIS — N058 Unspecified nephritic syndrome with other morphologic changes: Secondary | ICD-10-CM | POA: Diagnosis not present

## 2013-05-22 DIAGNOSIS — I10 Essential (primary) hypertension: Secondary | ICD-10-CM | POA: Diagnosis not present

## 2013-06-27 DIAGNOSIS — N183 Chronic kidney disease, stage 3 unspecified: Secondary | ICD-10-CM | POA: Diagnosis not present

## 2013-06-27 DIAGNOSIS — E119 Type 2 diabetes mellitus without complications: Secondary | ICD-10-CM | POA: Diagnosis not present

## 2013-06-27 DIAGNOSIS — N2581 Secondary hyperparathyroidism of renal origin: Secondary | ICD-10-CM | POA: Diagnosis not present

## 2013-06-27 DIAGNOSIS — D631 Anemia in chronic kidney disease: Secondary | ICD-10-CM | POA: Diagnosis not present

## 2013-06-27 DIAGNOSIS — N179 Acute kidney failure, unspecified: Secondary | ICD-10-CM | POA: Diagnosis not present

## 2013-06-27 DIAGNOSIS — I10 Essential (primary) hypertension: Secondary | ICD-10-CM | POA: Diagnosis not present

## 2013-06-27 DIAGNOSIS — R809 Proteinuria, unspecified: Secondary | ICD-10-CM | POA: Diagnosis not present

## 2013-07-28 DIAGNOSIS — L851 Acquired keratosis [keratoderma] palmaris et plantaris: Secondary | ICD-10-CM | POA: Diagnosis not present

## 2013-07-28 DIAGNOSIS — E781 Pure hyperglyceridemia: Secondary | ICD-10-CM | POA: Diagnosis not present

## 2013-07-28 DIAGNOSIS — I4891 Unspecified atrial fibrillation: Secondary | ICD-10-CM | POA: Diagnosis not present

## 2013-07-28 DIAGNOSIS — E119 Type 2 diabetes mellitus without complications: Secondary | ICD-10-CM | POA: Diagnosis not present

## 2013-07-28 DIAGNOSIS — I10 Essential (primary) hypertension: Secondary | ICD-10-CM | POA: Diagnosis not present

## 2013-07-28 DIAGNOSIS — B351 Tinea unguium: Secondary | ICD-10-CM | POA: Diagnosis not present

## 2013-08-06 DIAGNOSIS — I4891 Unspecified atrial fibrillation: Secondary | ICD-10-CM | POA: Diagnosis not present

## 2013-08-28 DIAGNOSIS — E1129 Type 2 diabetes mellitus with other diabetic kidney complication: Secondary | ICD-10-CM | POA: Diagnosis not present

## 2013-08-28 DIAGNOSIS — N058 Unspecified nephritic syndrome with other morphologic changes: Secondary | ICD-10-CM | POA: Diagnosis not present

## 2013-08-28 DIAGNOSIS — E785 Hyperlipidemia, unspecified: Secondary | ICD-10-CM | POA: Diagnosis not present

## 2013-08-28 DIAGNOSIS — I10 Essential (primary) hypertension: Secondary | ICD-10-CM | POA: Diagnosis not present

## 2013-09-19 ENCOUNTER — Ambulatory Visit: Payer: Self-pay | Admitting: Family Medicine

## 2013-09-19 DIAGNOSIS — R911 Solitary pulmonary nodule: Secondary | ICD-10-CM | POA: Diagnosis not present

## 2013-09-19 DIAGNOSIS — J438 Other emphysema: Secondary | ICD-10-CM | POA: Diagnosis not present

## 2013-09-19 DIAGNOSIS — J479 Bronchiectasis, uncomplicated: Secondary | ICD-10-CM | POA: Diagnosis not present

## 2013-09-19 DIAGNOSIS — J342 Deviated nasal septum: Secondary | ICD-10-CM | POA: Diagnosis not present

## 2013-09-19 DIAGNOSIS — H6500 Acute serous otitis media, unspecified ear: Secondary | ICD-10-CM | POA: Diagnosis not present

## 2013-09-29 DIAGNOSIS — I4891 Unspecified atrial fibrillation: Secondary | ICD-10-CM | POA: Diagnosis not present

## 2013-09-29 DIAGNOSIS — I10 Essential (primary) hypertension: Secondary | ICD-10-CM | POA: Diagnosis not present

## 2013-09-29 DIAGNOSIS — N058 Unspecified nephritic syndrome with other morphologic changes: Secondary | ICD-10-CM | POA: Diagnosis not present

## 2013-09-29 DIAGNOSIS — E1129 Type 2 diabetes mellitus with other diabetic kidney complication: Secondary | ICD-10-CM | POA: Diagnosis not present

## 2013-10-24 DIAGNOSIS — H903 Sensorineural hearing loss, bilateral: Secondary | ICD-10-CM | POA: Diagnosis not present

## 2013-10-24 DIAGNOSIS — H6500 Acute serous otitis media, unspecified ear: Secondary | ICD-10-CM | POA: Diagnosis not present

## 2013-10-24 DIAGNOSIS — J342 Deviated nasal septum: Secondary | ICD-10-CM | POA: Diagnosis not present

## 2013-12-02 DIAGNOSIS — I5022 Chronic systolic (congestive) heart failure: Secondary | ICD-10-CM | POA: Diagnosis not present

## 2013-12-02 DIAGNOSIS — E119 Type 2 diabetes mellitus without complications: Secondary | ICD-10-CM | POA: Diagnosis not present

## 2013-12-02 DIAGNOSIS — I10 Essential (primary) hypertension: Secondary | ICD-10-CM | POA: Diagnosis not present

## 2013-12-02 DIAGNOSIS — I4891 Unspecified atrial fibrillation: Secondary | ICD-10-CM | POA: Diagnosis not present

## 2013-12-26 DIAGNOSIS — M79609 Pain in unspecified limb: Secondary | ICD-10-CM | POA: Diagnosis not present

## 2013-12-26 DIAGNOSIS — B351 Tinea unguium: Secondary | ICD-10-CM | POA: Diagnosis not present

## 2013-12-26 DIAGNOSIS — E119 Type 2 diabetes mellitus without complications: Secondary | ICD-10-CM | POA: Diagnosis not present

## 2014-01-16 DIAGNOSIS — I10 Essential (primary) hypertension: Secondary | ICD-10-CM | POA: Diagnosis not present

## 2014-01-16 DIAGNOSIS — N2581 Secondary hyperparathyroidism of renal origin: Secondary | ICD-10-CM | POA: Diagnosis not present

## 2014-01-16 DIAGNOSIS — D631 Anemia in chronic kidney disease: Secondary | ICD-10-CM | POA: Diagnosis not present

## 2014-01-16 DIAGNOSIS — E1165 Type 2 diabetes mellitus with hyperglycemia: Secondary | ICD-10-CM | POA: Diagnosis not present

## 2014-01-16 DIAGNOSIS — R809 Proteinuria, unspecified: Secondary | ICD-10-CM | POA: Diagnosis not present

## 2014-01-16 DIAGNOSIS — N183 Chronic kidney disease, stage 3 unspecified: Secondary | ICD-10-CM | POA: Diagnosis not present

## 2014-01-16 DIAGNOSIS — E1129 Type 2 diabetes mellitus with other diabetic kidney complication: Secondary | ICD-10-CM | POA: Diagnosis not present

## 2014-02-06 DIAGNOSIS — E785 Hyperlipidemia, unspecified: Secondary | ICD-10-CM | POA: Diagnosis not present

## 2014-02-06 DIAGNOSIS — N183 Chronic kidney disease, stage 3 unspecified: Secondary | ICD-10-CM | POA: Diagnosis not present

## 2014-02-06 DIAGNOSIS — N058 Unspecified nephritic syndrome with other morphologic changes: Secondary | ICD-10-CM | POA: Diagnosis not present

## 2014-02-06 DIAGNOSIS — E1129 Type 2 diabetes mellitus with other diabetic kidney complication: Secondary | ICD-10-CM | POA: Diagnosis not present

## 2014-02-06 DIAGNOSIS — I4891 Unspecified atrial fibrillation: Secondary | ICD-10-CM | POA: Diagnosis not present

## 2014-02-06 DIAGNOSIS — Z23 Encounter for immunization: Secondary | ICD-10-CM | POA: Diagnosis not present

## 2014-02-06 DIAGNOSIS — I10 Essential (primary) hypertension: Secondary | ICD-10-CM | POA: Diagnosis not present

## 2014-02-06 LAB — LIPID PANEL
CHOLESTEROL: 129 mg/dL (ref 0–200)
HDL: 61 mg/dL (ref 35–70)
LDL CALC: 57 mg/dL
Triglycerides: 54 mg/dL (ref 40–160)

## 2014-05-20 DIAGNOSIS — B351 Tinea unguium: Secondary | ICD-10-CM | POA: Diagnosis not present

## 2014-05-20 DIAGNOSIS — M79675 Pain in left toe(s): Secondary | ICD-10-CM | POA: Diagnosis not present

## 2014-05-20 DIAGNOSIS — M79674 Pain in right toe(s): Secondary | ICD-10-CM | POA: Diagnosis not present

## 2014-06-12 DIAGNOSIS — I5022 Chronic systolic (congestive) heart failure: Secondary | ICD-10-CM | POA: Diagnosis not present

## 2014-06-12 DIAGNOSIS — E119 Type 2 diabetes mellitus without complications: Secondary | ICD-10-CM | POA: Diagnosis not present

## 2014-06-12 DIAGNOSIS — K219 Gastro-esophageal reflux disease without esophagitis: Secondary | ICD-10-CM | POA: Diagnosis not present

## 2014-06-12 DIAGNOSIS — I4891 Unspecified atrial fibrillation: Secondary | ICD-10-CM | POA: Diagnosis not present

## 2014-06-12 DIAGNOSIS — Z9889 Other specified postprocedural states: Secondary | ICD-10-CM | POA: Diagnosis not present

## 2014-06-12 DIAGNOSIS — E1121 Type 2 diabetes mellitus with diabetic nephropathy: Secondary | ICD-10-CM | POA: Diagnosis not present

## 2014-06-12 DIAGNOSIS — N183 Chronic kidney disease, stage 3 (moderate): Secondary | ICD-10-CM | POA: Diagnosis not present

## 2014-06-12 DIAGNOSIS — I10 Essential (primary) hypertension: Secondary | ICD-10-CM | POA: Diagnosis not present

## 2014-06-12 DIAGNOSIS — E785 Hyperlipidemia, unspecified: Secondary | ICD-10-CM | POA: Diagnosis not present

## 2014-08-05 DIAGNOSIS — R809 Proteinuria, unspecified: Secondary | ICD-10-CM | POA: Diagnosis not present

## 2014-08-05 DIAGNOSIS — E1122 Type 2 diabetes mellitus with diabetic chronic kidney disease: Secondary | ICD-10-CM | POA: Diagnosis not present

## 2014-08-05 DIAGNOSIS — I129 Hypertensive chronic kidney disease with stage 1 through stage 4 chronic kidney disease, or unspecified chronic kidney disease: Secondary | ICD-10-CM | POA: Diagnosis not present

## 2014-08-05 DIAGNOSIS — N183 Chronic kidney disease, stage 3 (moderate): Secondary | ICD-10-CM | POA: Diagnosis not present

## 2014-08-05 DIAGNOSIS — N2581 Secondary hyperparathyroidism of renal origin: Secondary | ICD-10-CM | POA: Diagnosis not present

## 2014-09-04 NOTE — Discharge Summary (Signed)
PATIENT NAME:  Justin Manning, Justin Manning MR#:  [id number] DATE OF BIRTH:  10-23-1935  CONSULTATIONS: Cardiology, Dr. Ubaldo Glassing and Dr. Saralyn Pilar; GI, Dr. Rayann Heman and Dr. Candace Cruise; and nephrology, Dr. Juleen China.   DISCHARGE DIAGNOSES: Severe hypokalemia, syncope, acute renal failure with chronic kidney disease, bacteremia, hypertension, diabetes.   CONDITION: Stable.   CODE STATUS: FULL CODE.   HOME MEDICATIONS:  1.  Aspirin 81 mg p.o. daily.  2.  Diltiazem CD 240 mg p.o. once a day.  3.  Crestor 40 mg p.o. at bedtime.  4.  Lantus 50 units subcu at bedtime.  5.  Vitamin B12 1000 mcg p.o. daily.  6.  Augmentin 875 mg/125 mg p.o. every 12 hours for 10 days.  7.  Reglan 10 mg p.o. 4 times a day p.r.n. for hiccups.  8.  Protonix 40 mg p.o. b.i.d.  9.  Hydralazine 50 mg p.o. 4 times a day.  10.  Potassium 40 mEq once a day.   DIET: Low sodium, low fat, low cholesterol diet.   ACTIVITY: As tolerated.   FOLLOW-UP CARE:  1.  Follow up with PCP within 1 to 2 weeks.  2.  Follow up with Dr. Juleen China within 1 week. The patient needs to follow up his potassium. I just give prescirption of potassium 40 mEq for 10 days, but the patient needs to follow up his potassium level with Dr. Juleen China or PCP and may need to adjust potassium dose. If hyperkalemia happens, the patient needs to stop potassium.  3.  Follow up with Dr. Ubaldo Glassing. The patient's Pradaxa was discontinued due to acute renal failure according to Dr. Bethanne Ginger suggestion. The patient may need a follow-up with Dr. Ubaldo Glassing for further recommendations.     REASON FOR ADMISSION: Passed out.   HOSPITAL COURSE: The patient is a 79 year old African American male with a history of Afib on Pradaxa, hypertension, hyperlipidemia, who presented to the ED with a syncopal episode for 2 or 3 minutes.   EEG: The patient has a short burst of wide complex of QRS, suspected V-TACH.   The patient was noted to have a creatinine 1.9, potassium 2 with significant liver function abnormality.  Potassium has been repleted.   For detailed history and physical examination, please refer to the admission note dictated by Dr. Bridgette Habermann.   On admission date, laboratory data showed glucose 199. BNP was 4961. BUN 20, creatinine 1.96, potassium 2.1, chloride 103, anion gap 11, magnesium 1.8, ALT 13.86, AST 2000, alk phos 434. CK 184, troponin 0.21.   CAT scan of head did not show any intracranial process.   ASSESSMENT AND PLAN:  1.  Syncope, which is possibly due to arrhythmia caused by severe hypokalemia. Dr. Ubaldo Glassing examined the patient and suggested the patient has no V-TACH but has a U wave due to hypokalemia. The patient then needed aggressive potassium supplement. Since the patient has acute renal failure on chronic kidney disease, Pradaxa needed to be discontinued.  2.  Severe hypokalemia. The patient continues to have hypokalemia. The patient has been treated with potassium supplement on a daily basis with both p.o. and IV potassium. Dr. Juleen China evaluated the patient and suggested potassium loss may be due to gastrointestinal loss or from kidney. He suggested to continue IV fluids and hold lisinopril. The patient's potassium is 4.0 today. The patient's weakness has much improved. He has no complaints except for hiccups which are possibly due to hypokalemia.  3.  Abnormal liver function tests with marked elevated liver transaminases, possibly due to  ischemic hepatopathy related to syncopal event. The patient's hepatitis work-up is norma, other serology workupis in normal range. The patient's liver function has been improving. The last liver function test showed SGOT decreased to 138, SGPT 232, alk phos 446.  4.  Bacteriemia with leukocytosis. The patient has one bottole of  positive  blood culture for Escherichia coli, sensitive to Rocephin. The patient was treated with Zosyn initially and then changed to Rocephin. The patient will get p.o. Augmentin for 10 days. The patient's white count decreased  from 18.4 to 9.2.  5.  Diabetes. The patient has been treated with sliding scale and Lantus.  6.  Hypertension. The patient has been treated with Cardizem since the lisinopril was discontinued. Hydralazine was added adjusted to 50 mg 4 times daily.  7.  Hiccups. The patient has been treated with Reglan p.r.n. and a proton pump inhibitor.  The patient has no symptoms. His vital signs are stable. Potassium is normal. The patient is clinically stable and will be discharged to home today. I discussed the patient's discharge plan with the patient, the patient's daughter, Dr. Juleen China, the case manager and the nurse.   TIME SPENT: About 45 minutes.   The patient's daughter has many questions. All questions were answered.    ____________________________ Demetrios Loll, MD qc:np D: 01/13/2013 14:49:00 ET T: 01/13/2013 15:16:59 ET JOB#: [id number]  cc: Demetrios Loll, MD, <Dictator> Demetrios Loll MD ELECTRONICALLY SIGNED 01/13/2013 16:15

## 2014-09-04 NOTE — Consult Note (Signed)
PATIENT NAME:  Justin Manning, Justin Manning MR#:  [id number] DATE OF BIRTH:  [birth year]  DATE OF CONSULTATION:  01/09/2013  REFERRING PHYSICIAN:   CONSULTING PHYSICIAN:  Theodore Demark, NP  REASON FOR CONSULTATION:  I appreciate the consult ordered by Dr. Bridgette Habermann for a very pleasant 78 year old African American man with a history of a-fib on Pradaxa, hypertension, hyperlipidemia, who was admitted for a syncopal episode for evaluation of elevated LFTs. The patient states he has never had any liver illnesses, states that he was feeling well until last night when he started feeling badly all over, denies any localized symptoms. He states he fell but cannot remember syncopal episode. I do note AST greater than 2000 and ALT in the 1300s, total bilirubin was 1.9. PT of 17, normal platelets. Ultrasound, acetaminophen level and urine drug screen and hepatitis serology, ANA, ASMA, ceruloplasmin, iron studies pending. I do note elevated troponin with a cardiac consult pending. He states no family history of liver disease, stop drinking alcohol many years ago. Denies illicits, IVDU, risky sexual behavior, tattoos, a history of dialysis, blood transfusion, incarceration, foreign travel, [employer], health care work, a history of jaundice or ascites. Does report that he ate at a seafood restaurant about two weeks ago, states he has been taking 2 tablets of acetaminophen twice daily for various pains. I do note the H and P, he was found to be having some wide-complex tachycardia in runs while on EMS trip. He denies all GI-related complaints.   PAST MEDICAL HISTORY:  Atrial fibrillation, diabetes, hypertension, enlarged prostate, a history of esophageal dilation, hypercholesteremia, colon resection due to polyp, left total knee replacement, right shoulder surgery.   FAMILY HISTORY:  Significant for diabetes. No known GI issues presently.   SOCIAL HISTORY:  No tobacco, quit alcohol as noted. No illicits. Recently moved  from [place] to be near his family, has been establishing primary care in [place].   ALLERGIES:  No known allergies.   HOME MEDICATIONS: 1  ASA 81 mg p.o. daily.  2.  Crestor 40 mg p.o. daily.  3.  Diltiazem 240 mg extended release daily.  4.  Lantus 15 units at bedtime.  5.  Lisinopril 40 mg p.o. daily.  6.  Pradaxa 150 mg p.o. b.i.d.  7.  Vitamin B12 1000 mg p.o. daily.  8.  Acetaminophen 2 tablets of unknown strength b.i.d.   REVIEW OF SYSTEMS:  Ten systems reviewed. Significant for abrasion to right forehead after fall with intermittent headaches and general malaise. Denies all further complaints.   MOST RECENT LABS:  BNP 4961, glucose 199, iron 29, BUN 20, creatinine 1.96, sodium 138, potassium 2.1. He did receive replacement for this. Chloride 103, GFR 38. Iron binding capacity 245, iron saturation 12, osmolality 284, magnesium 1.8, calcium 8.7, total protein 6.9, albumin 2.9, total bilirubin 1.9, ALP 434, AST greater than 2000, ALT 1386, CK 184. CPK-MB 1.0. Troponin 0.21, free thyroxine 1.37, TSH 0.41. WBC 14.8, hemoglobin 13.2, hematocrit 39, platelet count 26. Red cells are normocytic with normal RDW. PT 17, INR 1.4. Stool test was negative for C. diff. His acetaminophen level is back now and is less than 2.   CT head was without any acute intracranial process. CT of the abdomen and pelvis demonstrates some thickening of the distal esophagus. There was no issue in the liver. There is a ventral wall abdominal wall hernia and some diverticulosis without evidence of diverticulitis and no other GI issues. Chest x-ray was without evidence of acute cardiopulmonary  abnormalities.   PHYSICAL EXAMINATION:  VITAL SIGNS:  Most recent vital signs:  Temperature 98, pulse 73, respiratory rate 20, blood pressure 161/73, SAO2 95% on room air.  GENERAL:  A well developed elderly man lying in bed in no acute distress.  HEENT:  Normocephalic. Does have slight abrasion on the right forehead and  smaller on the nasal bridge. There is a slight ecchymotic area underneath the right eye. Sclerae anicteric and clear. There is no other redness, drainage, or inflammation noted.  NECK:  Supple. No JVD, thyromegaly, lymphadenopathy.  CARDIAC:  S1, S2, regular rate and rhythm. No MRG. No appreciable edema.  RESPIRATORY:  Respirations eupneic.  LUNGS:  CTAB.  ABDOMEN:  Somewhat protuberant abdomen. Bowel sounds x 4, very soft, nontender. No guarding, rigidity, peritoneal signs, hepatosplenomegaly, masses or other abnormalities noted.  EXTREMITIES:  MAEW. No clubbing or cyanosis. Sensation appears to be intact.  NEUROLOGIC:  Alert, oriented x 3. Cranial nerves II through XII intact. He does have some mild confusion but is able to converse appropriately. Speech clear. No facial droop.  PSYCHIATRIC:  Pleasant, mood stable, calm.   IMPRESSION AND PLAN:  Elevated LFTs. The differential includes toxins, ischemia and infections. We will await his labs, ultrasound results. Do recommend avoiding liver-harming substances and daily LFTs with PT-INR. We will follow with you. Additionally once his condition improves, it may be worthwhile to discuss possible EGD to evaluate his abnormal CT findings. We will defer at present due to his clinical condition.   Thank you very much for this consult. These services were provided by Stephens November, MSN, [facility], in collaboration with Dr. Arther Dames with whom I have discussed this patient.  ____________________________ Theodore Demark, NP chl:jm D: 01/09/2013 16:46:59 ET T: 01/09/2013 18:11:26 ET JOB#: [id number]  cc: Theodore Demark, NP, <Dictator> [place] SIGNED 01/10/2013 8:52

## 2014-09-04 NOTE — Consult Note (Signed)
Brief Consult Note: Diagnosis: Syncope.   Patient was seen by consultant.   Consult note dictated.   Comments: Appreciate consult for 79 y/o [place] American man with history of DM, alzheimer's, HL, BPH, atrial fibrillation with pradaxa therapy, colon resection due to polyp, TKR, shoulder pinning, for evaluation of elevated LFTs. Patient states he has never had any liver illness. States he was feeling well until last night, when he started feeling bad all over- denies any localized symptoms. States he fell but cannot remember syncopal episode.  Do note AST >2000, ALT 1300s, total bilirubin of 1.9, PT of 17, normal platelets.  [place], acetaminophen level, UDS,  and hepatitis serologies pending.  Troponin elevated- cardiac consult pending States no family hx liver disease, and stopped etoh years ago.  Denies illicits,IVDU, risky sex behaviors, tattoos, hx dialysis/blood transfusions, incarceration, foreign travel, [employer], health care work, history of any jaundice or ascites. Does report he ate at a seafood restaraunt about 2w ago. States he takes acetaminophen 2 tabs bid for various pains.  Denies all GI related complaints. Impression; Elevated LFTs: Diff includes toxins, ischemia, infection. Await labs/US results. Recommend avoiding liver harming substances, daily lfts/pt-inr. Will follow with you.  Electronic Signatures: Stephens November H (NP)  (Signed 28-Aug-14 16:36)  Authored: Brief Consult Note   Last Updated: 28-Aug-14 16:36 by Theodore Demark (NP)

## 2014-09-04 NOTE — Consult Note (Signed)
I have seen and examined Justin Manning and agree with Darrick Meigs [place]'s a/p.   is most likely ischemic hepatopathy related the syncopal event.  Suspect he became hypotensive in the setting of the syncope.   INR nml and no AMS. Will watch these closely.  will pursue other etiolgies.  Tylenol level was < 2 so NAC not indicated.  obtain liver u/s with dopplers, ama,asma, ana, ceruloplasmin, iron studies, hepatitis serologies. monitor dialy liver enzymes and INR. Watch for AMS.  We will contiue to follow.  Electronic Signatures: Arther Dames (MD)  (Signed on 28-Aug-14 17:08)  Authored  Last Updated: 28-Aug-14 17:08 by Arther Dames (MD)

## 2014-09-04 NOTE — Consult Note (Signed)
   Present Illness patient is a 79 year old male with history of reported chronic atrial fibrillation currently treated at [facility] who presented to [facility] after an apparent syncopal episode causing trauma to the right frontal portion of his head. electrocardiogram on arrival in the emergency department was normal sinus rhythm with U waves. Patient apparently had a wide-complex tachycardia in route. Records ar available. Patient was noted to be incontinent of stool. He also was noted tly elevated liver transaminases as well as profound hypokalemia with a serum potassiumof 2.1. His was 1.8. He had a mildly elevated at cardiac troponin level but denied chest pain. EKG revealed no Patient denies diuretic use or excessive diarrhea. He had been taking Tylenol frequently for back pain.e denied illicit drug use.   Physical Exam:  GEN well developed, well nourished   HEENT PERRL, hearing intact to voice   NECK supple   RESP normal resp effort   CARD Regular rate and rhythm  No murmur   ABD denies tenderness  normal BS  no Adominal Mass   LYMPH negative neck   SKIN normal to palpation   NEURO cranial nerves intact, motor/sensory function intact   PSYCH A+O to time, place, person   Review of Systems:  Subjective/Chief Complaint no chest pain, right forehead pain   General: Weakness   Skin: No Complaints   ENT: No Complaints   Eyes: No Complaints   Neck: No Complaints   Respiratory: No Complaints   Cardiovascular: No Complaints   Gastrointestinal: Diarrhea   Genitourinary: No Complaints   Vascular: No Complaints   Musculoskeletal: No Complaints   Neurologic: Fainting   Hematologic: No Complaints   Endocrine: No Complaints   Psychiatric: No Complaints   Review of Systems: All other systems were reviewed and found to be negative   Medications/Allergies Reviewed Medications/Allergies reviewed   EKG:  EKG NSR   Interpretation U waves  present    No Known Allergies:    Impression 79 year old male with history of apparent atrial fibrillation who was admitted after suffering a syncopal episode. In route he was noted to have a wide-complex tachycardia per EMS. Records are currently not available. Patient was noted to have profound hypokalemia with a serum potassium of 2.1. His liver transaminases, bilirubin as well as echo and phosphatase were markedly elevated. Patient also had renal insufficiency with a serum creatinine of 2.01. His magnesium was normal. He had mild serum troponin elevations. Etiology of his transaminitis or hypokalemia is unclear. Certainly excessive diarrhea causing GI potassium loss could be present. Volume depletion could also explain his elevated creatinine. His transaminitis may be secondary to relative hypotension during his event this consistent with shock liver. His syncope is likelserum magnesi   Plan 1. Replete potassium 2. Agree with nephrology evaluation to evaluate the etiology of his hypokalemia 3. Agree with GI evaluation to assess etiology of his transaminitis 4. Continue to rule out for myocardial infarction 5. Would not proceed with functional study or invasive cardiac evaluation and  serum potassium was repleted 6. Further recommendations based on results of beforementioned workup   Electronic Signatures: Teodoro Spray (MD)  (Signed 29-Aug-14 14:37)  Authored: General Aspect/Present Illness, History and Physical Exam, Review of System, EKG , Allergies, Impression/Plan   Last Updated: 29-Aug-14 14:37 by Teodoro Spray (MD)

## 2014-09-04 NOTE — H&P (Signed)
PATIENT NAME:  Justin Manning, Justin Manning MR#:  [id number] DATE OF BIRTH:  [birth year]  DATE OF ADMISSION:  01/09/2013  PRIMARY CARE PHYSICIAN:  At [facility]  REFERRING PHYSICIAN:  Dr. Jasmine December   CHIEF COMPLAINT:  Passed out.  HISTORY OF PRESENT ILLNESS:  The patient is a pleasant 79 year old [place] American male with history of afib on Pradaxa, hypertension, hyperlipidemia, who is recently retired here from [place] and moved to be close to his family. The patient has been establishing care at [facility] and was seen by them yesterday. Of note, the patient also had a recent echocardiogram in August at [facility]. He comes in after experiencing syncope. He felt unwell yesterday, but had nonspecific symptoms. He just told his family that he did not feel good, but did not have any other symptoms or complaints. This morning while walking to the bathroom, he passed out, syncopized. He possibly passed out for 2 or 3 minutes. EMS was called, who told the staff in the ER that ran short bursts of wide complex QRSs, possibly V. tach, but they did not have any rhythm strips. These lasted for a few seconds. There were 2 runs of this. He sustained some abrasions to the face, and he came into the hospital where he was found with a creatinine of 1.9, potassium of 2 and significant liver abnormalities. The case was discussed with Dr. Ubaldo Glassing previously before the troponin had come back.  The potassium is being repleted and hospitalist services were contacted for further evaluation and management.   PAST MEDICAL HISTORY: 1.  Afib, but now appears to be sinus rhythm. 2. History of diabetes.  3. Hypertension. 4.  Enlarged prostate.  5.  History of esophageal dilation.  6.  History of hypercholesterolemia.  7.  History of colon resection. 8.  History of knee surgery.  9.  Right shoulder surgery.   ALLERGIES:  No known drug allergies.   FAMILY HISTORY:  Diabetes.   SOCIAL HISTORY: No tobacco. Used to be a drinker, but quit  about 15 to 20 years ago. No other drug use. He is retired, recently moved from [place].   OUTPATIENT MEDICATIONS: Aspirin 81 mg daily, Crestor 40 mg daily, diltiazem extended-release 240 mg daily one tab, Lantus 15 units once a day, lisinopril 40 mg daily, Pradaxa 150 mg one cap 2 times a day, vitamin B 12 1000 mcg daily.  REVIEW OF SYSTEMS:  CONSTITUTIONAL: No fever, but had some chills this morning. He feels like he possibly lost some weight in the last few months.  EYES:  No blurry vision or double vision.  ENT: No tinnitus or hearing loss. No postnasal drip.  RESPIRATORY: No cough, wheezing, shortness of breath, hemoptysis, COPD or painful respirations.  CARDIOVASCULAR: Denies chest pains. Has some positional dizziness. No orthopnea or swelling in the legs. Has history of afib. No history of MI or CHF per patient. No palpitations.  GASTROINTESTINAL: Denies nausea, vomiting, diarrhea, abdominal pain, hematemesis, bloody stools, dark stools or ulcers.  GENITOURINARY: Denies dysuria, hematuria or frequency. Has enlarged prostate.  ENDOCRINE: Denies polyuria, nocturia. HEMATOLOGIC AND LYMPHATIC:  Denies anemia or easy bruising.  SKIN: Denies any new rashes.  MUSCULOSKELETAL:  Has arthritis in the right knee. NEUROLOGIC: Denies focal weakness, numbness, stroke or seizures, but he was found with urine and stool incontinence when he passed out.  PSYCHIATRIC:  Denies anxiety or insomnia.   PHYSICAL EXAMINATION: VITAL SIGNS: Temperature on arrival 99.4, pulse rate 78, respiratory rate 18. Blood pressure on arrival 160/70,  O2 sat 94% on room air.  GENERAL: The patient is a well-developed [place] American male lying in bed, no obvious distress.  HEENT:  The patient has a slight abrasion on the right forehead and a smaller one on nasal bridge, but otherwise appears to be normocephalic, atraumatic. Pupils are equal and reactive. Anicteric sclerae. Extraocular muscles intact. Moist mucous membranes.   NECK:  Supple. No thyroid tenderness. No cervical lymphadenopathy. No JVD.  CARDIOVASCULAR:  S1, S2. Regular rate and rhythm. No murmurs, rubs or gallops.  LUNGS:  Clear to auscultation without wheezing, rhonchi or rales.  ABDOMEN:  Soft, nontender, nondistended, but hyperactive bowel sounds in all quadrants.  EXTREMITIES:  No significant lower extremity edema.  NEUROLOGIC: Cranial nerves appear to be intact, II to XII. Strength is 5 out of 5 all extremities.  PSYCHIATRIC:  Awake, alert, oriented x 2. He is confused about the year.  LABORATORY DATA:  Glucose was 199. BNP is 4961, BUN 20, creatinine 1.96, sodium 138. Potassium is 2.1, chloride 103, GFR of 38, anion gap 11, magnesium 1.8, total albumin is 2.9, bilirubin 1.9, alk phos 434. AST greater than 2000. ALT is 1386. CK total 184. Troponin 0.21. CK-MB is 1. TSH is 0.41. WBC is 14.8. Hemoglobin is 13.2. Platelets are 206. INR is 1.4. PT is 17.  EKG shows normal sinus rhythm, nonspecific T-wave changes. There are some ST depressions on V6, and this very extended and the QTc is severely prolonged at 633 milliseconds corrected.   CT of the head without contrast shows no acute intracranial process.   ASSESSMENT AND PLAN: We have a 79 year old African American male who recently moved from [place] with history of atrial fibrillation on Pradaxa, currently appears to be sinus rhythm, history of possible early dementia, hypertension, hyperlipidemia, feeling unwell for the past day or so who had a syncopal episode with loss of consciousness, as well as urinary and fecal incontinence who has severe electrolyte derangements, renal failure, hepatic failure and severe prolonged QTc of about 633 milliseconds. He will be admitted to the hospital. The syncope possibly could be multifactorial, as well as possibly cardiogenic with short bursts of ventricular tachycardia, as well as renal failure and hyperkalemia as causal agents as well. QTc is prolonged, which  could have caused the patient to pass out as well. This is being corrected. I have ordered stats oral and discussed the case with cardiologist, as well as the Emergency Room physician. We will monitor him on a remote telemetry bed for any further arrhythmias, but he is a high-risk candidate for developing further syncope and cardiogenic problems. His magnesium is not low. I will go ahead and recheck another potassium this afternoon. He has had a recent echocardiogram in [facility] and I will go ahead and get those results. Would try to correct the potassium today, if possible. Other possible infections as possible underlying infection versus seizure activity. He has a white blood cell count and a low-grade fever. We will go ahead and get blood cultures, urine cultures and follow with x-ray of the chest. He has no upper respiratory infection, urinary tract infection or gastrointestinal symptoms. Would obtain an EEG to look for seizure activity but, per family, there was no shaking activity except that there was a urinary and fecal incontinence.  He also has renal failure, unclear if this is acute or chronic. We would hold ACE inhibitor, start him on some gentle fluids and consider nephrology consult if this does not improve. He has severe elevation of  liver enzymes, which appears to be more of mixed function with elevated alkaline phosphatase and bilirubin, but his AST, ALT is severely elevated. The patient did say that he was taking about a gram of Tylenol for the last couple of months, and also he is on a statin. I do not know if this is acute shock liver or this is as a result of medications. Underlying infection is not included. Would go ahead and get a CAT scan of abdomen and pelvis for better evaluation and I will go ahead and obtain a GI consult. We would hold the Tylenol, of course, and also hold the statin at this point and follow another hepatic panel in the morning.  He does have positive troponin. He denies  having any chest pains. I would go ahead and start the patient on aspirin. He is not on a beta blocker and with the severe QTc prolongation, I would be cautious with starting one at this point, but I would monitor the troponins and follow him clinically. Of note, he has no pains in the chest at this point. We would obtain recent labs and  echocardiogram from [facility], if possible. He is starting to establish care at [facility] cardiology.  He has a low TSH, and I will go ahead and check a free thyroxine.  I do not know if the acute renal failure is his baseline or this is all new, and I would follow the creatinine as well. Currently, he appears to be in stage III renal failure. He has elevated BNP of close to 5000, but he does not appear to be volume overloaded at all. We will see what the echocardiogram shows. He appears to have systemic inflammatory response syndrome criteria with leukocytosis and low-grade fevers. Would follow with the cultures.  Overall, the patient is critically ill with renal failure, severe electrolyte abnormalities, syncope and is at a high risk for having further cardiac events such as ventricular fibrillation. Would monitor him carefully and replete the potassium aggressively.  THE PATIENT IS FULL CODE.  TOTAL TIME SPENT:  65 minutes    ____________________________ Vivien Presto, MD sa:ce D: 01/09/2013 12:46:01 ET T: 01/09/2013 13:28:48 ET JOB#: [id number]  cc: Vivien Presto, MD, <Dictator> Vivien Presto MD ELECTRONICALLY SIGNED 02/04/2013 13:50

## 2014-09-04 NOTE — Consult Note (Signed)
Brief Consult Note: Diagnosis: syncope with reported wide complex tachycardia enroute. Has history of afib on pradaxa. Now in nsr. Severe hypokalemia with ekg showing U waves. QTc reading on ekg tracing is including the u wave in the qtc.   Patient was seen by consultant.   Recommend further assessment or treatment.   Orders entered.   Discussed with Attending MD.   Comments: 79 yo male with hisotry of afib by report currently followed in [place] after moving form [place] several months ago. Was on Pradaxa. Presetned after suffering a fall with syncope. WIde complex tachycardia reported by ems enroute. In er Markedly elevated liver transaminases and severe hypokalemia at 2.1 u waves noted on ekg. Arrythmia likley secondary to profound hypokalemia. Will aggressively replete K and keep magnesium greater than 2.0. Hepatology and nephrology conslut to determine etiology of transaminitis and acte renal insuffiency and hypokalemia. Elevated tropoinin of unclear etiology . No chest pain or ischemic changes on ekg. May also be due to arrtyhmia with increased demand vs hypokalemia. Ischemia may also be present but clinically not apparent. FUll note to follow..  Electronic Signatures: Teodoro Spray (MD)  (Signed 28-Aug-14 16:59)  Authored: Brief Consult Note   Last Updated: 28-Aug-14 16:59 by Teodoro Spray (MD)

## 2014-09-04 NOTE — Consult Note (Signed)
Chief Complaint:  Subjective/Chief Complaint Covering for Dr. Rayann Heman. No GI complaints. LFT continues to improve. Cr worsening though.   VITAL SIGNS/ANCILLARY NOTES: **Vital Signs.:   30-Aug-14 08:11  Vital Signs Type Routine  Temperature Temperature (F) 100.9  Celsius 38.2  Pulse Pulse 66  Respirations Respirations 18  Systolic BP Systolic BP 622  Diastolic BP (mmHg) Diastolic BP (mmHg) 66  Mean BP 104  Pulse Ox % Pulse Ox % 97  Pulse Ox Activity Level  At rest  Oxygen Delivery Room Air/ 21 %   Brief Assessment:  GEN no acute distress   Cardiac Regular   Respiratory clear BS   Gastrointestinal Normal   Lab Results: Hepatic:  30-Aug-14 04:20   Bilirubin, Total  3.6  Alkaline Phosphatase  244  SGPT (ALT)  409  SGOT (AST)  254  Total Protein, Serum  6.0  Albumin, Serum  2.2  Routine Chem:  30-Aug-14 04:20   Phosphorus, Serum  2.2 (Result(s) reported on 11 Jan 2013 at 05:56AM.)  Glucose, Serum  130  BUN 17  Creatinine (comp)  2.24  Sodium, Serum 138  Potassium, Serum  2.9  Chloride, Serum 107  CO2, Serum 22  Calcium (Total), Serum  8.1  Osmolality (calc) 279  eGFR (African American)  32  eGFR (Non-African American)  28 (eGFR values <76m/min/1.73 m2 may be an indication of chronic kidney disease (CKD). Calculated eGFR is useful in patients with stable renal function. The eGFR calculation will not be reliable in acutely ill patients when serum creatinine is changing rapidly. It is not useful in  patients on dialysis. The eGFR calculation may not be applicable to patients at the low and high extremes of body sizes, pregnant women, and vegetarians.)  Anion Gap 9  Cardiac:  30-Aug-14 04:20   CK, Total  245 (Result(s) reported on 11 Jan 2013 at 05:56AM.)  Routine Coag:  30-Aug-14 04:20   Prothrombin  15.4  INR 1.2 (INR reference interval applies to patients on anticoagulant therapy. A single INR therapeutic range for coumarins is not optimal for  all indications; however, the suggested range for most indications is 2.0 - 3.0. Exceptions to the INR Reference Range may include: Prosthetic heart valves, acute myocardial infarction, prevention of myocardial infarction, and combinations of aspirin and anticoagulant. The need for a higher or lower target INR must be assessed individually. Reference: The Pharmacology and Management of the Vitamin K  antagonists: the seventh ACCP Conference on Antithrombotic and Thrombolytic Therapy. CWLNLG.9211Sept:126 (3suppl): [id number] A HCT value >55% may artifactually increase the PT.  In one study,  the increase was an average of 25%. Reference:  "Effect on Routine and Special Coagulation Testing Values of Citrate Anticoagulant Adjustment in Patients with High HCT Values." American Journal of Clinical Pathology 2006;126:400-405.)  Routine Hem:  30-Aug-14 04:20   WBC (CBC) 9.2  RBC (CBC)  3.92  Hemoglobin (CBC)  12.1  Hematocrit (CBC)  35.0  Platelet Count (CBC) 176  MCV 89  MCH 30.7  MCHC 34.5  RDW  14.8  Neutrophil % 89.0  Lymphocyte % 3.4  Monocyte % 7.2  Eosinophil % 0.3  Basophil % 0.1  Neutrophil #  8.2  Lymphocyte #  0.3  Monocyte # 0.7  Eosinophil # 0.0  Basophil # 0.0 (Result(s) reported on 11 Jan 2013 at 05:34AM.)   Assessment/Plan:  Assessment/Plan:  Assessment LFT improving. Prob due to ischemia.   Plan Expect LFT to continue to improve. WIll sign off. Call [facility] if LFT  worsens again. Otherwise, patient can f/u with Dr. Rayann Heman after discharge. Thanks.   Electronic Signatures: Verdie Shire (MD)  (Signed 30-Aug-14 10:15)  Authored: Chief Complaint, VITAL SIGNS/ANCILLARY NOTES, Brief Assessment, Lab Results, Assessment/Plan   Last Updated: 30-Aug-14 10:15 by Verdie Shire (MD)

## 2014-10-13 DIAGNOSIS — E785 Hyperlipidemia, unspecified: Secondary | ICD-10-CM | POA: Diagnosis not present

## 2014-10-13 DIAGNOSIS — I10 Essential (primary) hypertension: Secondary | ICD-10-CM | POA: Diagnosis not present

## 2014-10-13 DIAGNOSIS — I519 Heart disease, unspecified: Secondary | ICD-10-CM | POA: Diagnosis not present

## 2014-10-13 DIAGNOSIS — I700 Atherosclerosis of aorta: Secondary | ICD-10-CM | POA: Diagnosis not present

## 2014-10-13 DIAGNOSIS — N183 Chronic kidney disease, stage 3 (moderate): Secondary | ICD-10-CM | POA: Diagnosis not present

## 2014-10-13 DIAGNOSIS — E1121 Type 2 diabetes mellitus with diabetic nephropathy: Secondary | ICD-10-CM | POA: Diagnosis not present

## 2014-10-13 LAB — HEMOGLOBIN A1C: HEMOGLOBIN A1C: 9.6 % — AB (ref 4.0–6.0)

## 2014-11-09 ENCOUNTER — Encounter: Payer: Self-pay | Admitting: Family Medicine

## 2014-11-09 DIAGNOSIS — Z85038 Personal history of other malignant neoplasm of large intestine: Secondary | ICD-10-CM | POA: Insufficient documentation

## 2014-11-09 DIAGNOSIS — I4891 Unspecified atrial fibrillation: Secondary | ICD-10-CM | POA: Insufficient documentation

## 2014-11-09 DIAGNOSIS — Z9889 Other specified postprocedural states: Secondary | ICD-10-CM | POA: Insufficient documentation

## 2014-11-09 DIAGNOSIS — E1169 Type 2 diabetes mellitus with other specified complication: Secondary | ICD-10-CM | POA: Insufficient documentation

## 2014-11-09 DIAGNOSIS — R911 Solitary pulmonary nodule: Secondary | ICD-10-CM | POA: Insufficient documentation

## 2014-11-09 DIAGNOSIS — E785 Hyperlipidemia, unspecified: Secondary | ICD-10-CM

## 2014-11-09 DIAGNOSIS — D638 Anemia in other chronic diseases classified elsewhere: Secondary | ICD-10-CM | POA: Insufficient documentation

## 2014-11-09 DIAGNOSIS — Z8546 Personal history of malignant neoplasm of prostate: Secondary | ICD-10-CM | POA: Insufficient documentation

## 2014-11-09 DIAGNOSIS — N183 Chronic kidney disease, stage 3 unspecified: Secondary | ICD-10-CM | POA: Insufficient documentation

## 2014-11-09 DIAGNOSIS — I071 Rheumatic tricuspid insufficiency: Secondary | ICD-10-CM | POA: Insufficient documentation

## 2014-11-09 DIAGNOSIS — H9192 Unspecified hearing loss, left ear: Secondary | ICD-10-CM | POA: Insufficient documentation

## 2014-11-09 DIAGNOSIS — I779 Disorder of arteries and arterioles, unspecified: Secondary | ICD-10-CM | POA: Insufficient documentation

## 2014-11-09 DIAGNOSIS — I739 Peripheral vascular disease, unspecified: Secondary | ICD-10-CM

## 2014-11-09 DIAGNOSIS — F09 Unspecified mental disorder due to known physiological condition: Secondary | ICD-10-CM | POA: Insufficient documentation

## 2014-11-09 DIAGNOSIS — H6990 Unspecified Eustachian tube disorder, unspecified ear: Secondary | ICD-10-CM | POA: Insufficient documentation

## 2014-11-09 DIAGNOSIS — I5030 Unspecified diastolic (congestive) heart failure: Secondary | ICD-10-CM | POA: Insufficient documentation

## 2014-11-09 DIAGNOSIS — K219 Gastro-esophageal reflux disease without esophagitis: Secondary | ICD-10-CM | POA: Insufficient documentation

## 2014-11-09 DIAGNOSIS — E1122 Type 2 diabetes mellitus with diabetic chronic kidney disease: Secondary | ICD-10-CM | POA: Insufficient documentation

## 2014-11-09 DIAGNOSIS — I340 Nonrheumatic mitral (valve) insufficiency: Secondary | ICD-10-CM | POA: Insufficient documentation

## 2014-11-09 DIAGNOSIS — IMO0002 Reserved for concepts with insufficient information to code with codable children: Secondary | ICD-10-CM | POA: Insufficient documentation

## 2014-11-09 DIAGNOSIS — I10 Essential (primary) hypertension: Secondary | ICD-10-CM | POA: Insufficient documentation

## 2014-11-09 DIAGNOSIS — Z8619 Personal history of other infectious and parasitic diseases: Secondary | ICD-10-CM | POA: Insufficient documentation

## 2014-11-09 DIAGNOSIS — E1165 Type 2 diabetes mellitus with hyperglycemia: Secondary | ICD-10-CM

## 2014-11-09 DIAGNOSIS — E1129 Type 2 diabetes mellitus with other diabetic kidney complication: Secondary | ICD-10-CM | POA: Insufficient documentation

## 2014-11-09 DIAGNOSIS — I517 Cardiomegaly: Secondary | ICD-10-CM | POA: Insufficient documentation

## 2014-11-09 DIAGNOSIS — H6980 Other specified disorders of Eustachian tube, unspecified ear: Secondary | ICD-10-CM | POA: Insufficient documentation

## 2014-11-09 DIAGNOSIS — I700 Atherosclerosis of aorta: Secondary | ICD-10-CM | POA: Insufficient documentation

## 2014-11-12 ENCOUNTER — Ambulatory Visit (INDEPENDENT_AMBULATORY_CARE_PROVIDER_SITE_OTHER): Payer: Medicare Other | Admitting: Family Medicine

## 2014-11-12 ENCOUNTER — Encounter: Payer: Self-pay | Admitting: Family Medicine

## 2014-11-12 ENCOUNTER — Encounter (INDEPENDENT_AMBULATORY_CARE_PROVIDER_SITE_OTHER): Payer: Self-pay

## 2014-11-12 VITALS — BP 124/72 | HR 87 | Temp 98.5°F | Resp 16 | Ht 65.0 in | Wt 204.6 lb

## 2014-11-12 DIAGNOSIS — IMO0002 Reserved for concepts with insufficient information to code with codable children: Secondary | ICD-10-CM

## 2014-11-12 DIAGNOSIS — E1129 Type 2 diabetes mellitus with other diabetic kidney complication: Secondary | ICD-10-CM

## 2014-11-12 DIAGNOSIS — E1165 Type 2 diabetes mellitus with hyperglycemia: Secondary | ICD-10-CM

## 2014-11-12 NOTE — Progress Notes (Signed)
Name: Justin Manning   MRN: [id number]    DOB: Jun 21, 1935   Date:11/12/2014       Progress Note  Subjective  Chief Complaint  Chief Complaint  Patient presents with  . Follow-up    1 month  . Diabetes    Patient is currently in the donut hole. Patient states that he cannot afford his medication.     HPI  DMII: he did not start Novolog because he is in the donut hole and can't afford to pay for medications, however the patient stopped eating bread and taking Ensure and glucose has been at goal around 120, He denies polyphagia, polydipsia or polyuria. Last hgbA1C 9.6 one month ago.  Patient Active Problem List   Diagnosis Date Noted  . Anemia in chronic illness 11/09/2014  . Abdominal aortic atherosclerosis 11/09/2014  . A-fib 11/09/2014  . Atrial hypertrophy 11/09/2014  . Carotid arterial disease 11/09/2014  . Chronic kidney disease (CKD), stage III (moderate) 11/09/2014  . Diabetes mellitus with renal manifestations, uncontrolled 11/09/2014  . Diastolic dysfunction with heart failure 11/09/2014  . Dysfunction of eustachian tube 11/09/2014  . Dyslipidemia 11/09/2014  . Essential (primary) hypertension 11/09/2014  . Gastro-esophageal reflux disease without esophagitis 11/09/2014  . Hearing loss of left ear 11/09/2014  . H/O carotid endarterectomy 11/09/2014  . H/O malignant neoplasm of colon 11/09/2014  . H/O infectious disease 11/09/2014  . H/O malignant neoplasm of prostate 11/09/2014  . Lung nodule, solitary 11/09/2014  . Diastolic dysfunction, left ventricle 11/09/2014  . Mild cognitive disorder 11/09/2014  . MI (mitral incompetence) 11/09/2014  . TI (tricuspid incompetence) 11/09/2014    Past Surgical History  Procedure Laterality Date  . Endarterectomy Left     Carotid  . Shoulder surgery Right   . Knee surgery Right   . Partial colectomy      with Anastomosis    Family History  Problem Relation Age of Onset  . Alcohol abuse Brother   . Diabetes Daughter      History   Social History  . Marital Status: Single    Spouse Name: N/A  . Number of Children: 3  . Years of Education: 12th    Occupational History  . Not on file.   Social History Main Topics  . Smoking status: Former Smoker    Quit date: 05/15/1988  . Smokeless tobacco: Never Used  . Alcohol Use: No  . Drug Use: No  . Sexual Activity: Not Currently   Other Topics Concern  . Not on file   Social History Narrative     Current outpatient prescriptions:  .  Cyanocobalamin (VITAMIN B 12) 100 MCG LOZG, Take by mouth., Disp: , Rfl:  .  dabigatran (PRADAXA) 75 MG CAPS capsule, Take 2 tablets by mouth 2 (two) times daily., Disp: , Rfl:  .  diltiazem (DILACOR XR) 240 MG 24 hr capsule, Take 1 capsule by mouth daily., Disp: , Rfl:  .  glucose blood (FREESTYLE LITE) test strip, FREESTYLE LITE TEST (In Vitro Strip)  1 (one) Strip Strip check up to three times daily for 30 days  Quantity: 100;  Refills: 2   Ordered :12-Jun-2014  Steele Sizer MD;  Buddy Duty 12-Jun-2014 Active Comments: DM  on insulin, Disp: , Rfl:  .  hydrALAZINE (APRESOLINE) 50 MG tablet, Take 1 tablet by mouth 4 (four) times daily., Disp: , Rfl:  .  hydrochlorothiazide (HYDRODIURIL) 25 MG tablet, Take 1 tablet by mouth daily., Disp: , Rfl:  .  Insulin Glargine (LANTUS  SOLOSTAR) 100 UNIT/ML Solostar Pen, Inject 14 Units into the skin daily., Disp: , Rfl:  .  lisinopril (PRINIVIL,ZESTRIL) 20 MG tablet, Take 1 tablet by mouth 2 (two) times daily., Disp: , Rfl:  .  pantoprazole (PROTONIX) 40 MG tablet, Take 1 tablet by mouth 2 (two) times daily., Disp: , Rfl:  .  rosuvastatin (CRESTOR) 40 MG tablet, Take 1 tablet by mouth daily., Disp: , Rfl:   Allergies  Allergen Reactions  . Aspirin     tachycardia     ROS  Constitutional: Negative for fever or weight change.  Respiratory: Negative for cough and shortness of breath.   Cardiovascular: Negative for chest pain or palpitations.  Gastrointestinal: Negative for  abdominal pain, no bowel changes.  Musculoskeletal: Positive for gait problem - uses a cane but negative for  joint swelling.  Skin: Negative for rash.  Neurological: Negative for dizziness or headache.  No other specific complaints in a complete review of systems (except as listed in HPI above).   Objective  Filed Vitals:   11/12/14 1532  BP: 124/72  Pulse: 87  Temp: 98.5 F (36.9 C)  TempSrc: Oral  Resp: 16  Height: 5\' 5"  (1.651 m)  Weight: 204 lb 9.6 oz (92.806 kg)  SpO2: 97%    Body mass index is 34.05 kg/(m^2).  Physical Exam  Constitutional: Patient appears well-developed and well-nourished. No distress.  Eyes:  No scleral icterus.  Neck: Normal range of motion. Neck supple. Cardiovascular:Irregular rate and rhythm,  murmur heard 2/6 musical . No BLE edema. Pulmonary/Chest: Effort normal and breath sounds normal. No respiratory distress. Abdominal: Soft.  There is no tenderness. Psychiatric: Patient has a normal mood and affect. behavior is normal. Judgment and thought content normal. Muscular Skeletal: uses cane, walks slowly   Recent Results (from the past 2160 hour(s))  Hemoglobin A1c     Status: Abnormal   Collection Time: 10/13/14 12:00 AM  Result Value Ref Range   Hgb A1c MFr Bld 9.6 (A) 4.0 - 6.0 %    Dia PHQ2/9: Depression screen PHQ 2/9 11/12/2014  Decreased Interest 0  Down, Depressed, Hopeless 0  PHQ - 2 Score 0     Fall Risk: Fall Risk  11/12/2014  Falls in the past year? No   Assessment & Plan  1. Diabetes mellitus with renal manifestations, uncontrolled Seems like glucose is back to within normal limits, he has a follow up with podiatrist, advised yearly eye exam, urine micro is up to date. Recheck level in 3 months.

## 2014-11-23 ENCOUNTER — Telehealth: Payer: Self-pay | Admitting: Family Medicine

## 2014-11-23 MED ORDER — INSULIN GLARGINE 300 UNIT/ML ~~LOC~~ SOPN: 14.0000 [IU] | PEN_INJECTOR | Freq: Every day | SUBCUTANEOUS | Status: DC

## 2014-11-23 NOTE — Telephone Encounter (Signed)
Pt is checking status on the samples for insulin. He was told that because his insulin was expensive that you would get him samples

## 2014-11-23 NOTE — Telephone Encounter (Signed)
We don't have samples of Lantus, is this the other patient you wanted to switch to Toujeo due to the $15 monthly cost with the savings card?

## 2014-11-23 NOTE — Telephone Encounter (Signed)
Done, patient can get sample and prescription/voucher

## 2014-11-26 ENCOUNTER — Encounter: Payer: Self-pay | Admitting: Family Medicine

## 2014-11-26 NOTE — Telephone Encounter (Signed)
Spoke with Daughter and informed her prescription, vouchers, and samples are ready for pick up.

## 2014-11-27 ENCOUNTER — Other Ambulatory Visit: Payer: Self-pay | Admitting: Family Medicine

## 2014-11-27 ENCOUNTER — Telehealth: Payer: Self-pay

## 2014-11-27 MED ORDER — "NEEDLE (DISP) 20G X 1"" MISC": 1.0000 [IU] | Freq: Every day | Status: DC

## 2014-11-27 NOTE — Telephone Encounter (Signed)
Daughter called and stated no needles came with the free samples of Toujeo and need them call into their pharmacy. Thanks.

## 2014-12-02 DIAGNOSIS — M79674 Pain in right toe(s): Secondary | ICD-10-CM | POA: Diagnosis not present

## 2014-12-02 DIAGNOSIS — B351 Tinea unguium: Secondary | ICD-10-CM | POA: Diagnosis not present

## 2014-12-02 DIAGNOSIS — M79675 Pain in left toe(s): Secondary | ICD-10-CM | POA: Diagnosis not present

## 2014-12-05 ENCOUNTER — Other Ambulatory Visit: Payer: Self-pay | Admitting: Family Medicine

## 2014-12-30 ENCOUNTER — Other Ambulatory Visit: Payer: Self-pay | Admitting: Family Medicine

## 2014-12-30 NOTE — Telephone Encounter (Signed)
Patient requesting refill. 

## 2015-01-12 DIAGNOSIS — E1122 Type 2 diabetes mellitus with diabetic chronic kidney disease: Secondary | ICD-10-CM | POA: Diagnosis not present

## 2015-01-12 DIAGNOSIS — N183 Chronic kidney disease, stage 3 (moderate): Secondary | ICD-10-CM | POA: Diagnosis not present

## 2015-01-12 DIAGNOSIS — R809 Proteinuria, unspecified: Secondary | ICD-10-CM | POA: Diagnosis not present

## 2015-01-12 DIAGNOSIS — E876 Hypokalemia: Secondary | ICD-10-CM | POA: Diagnosis not present

## 2015-01-12 DIAGNOSIS — I10 Essential (primary) hypertension: Secondary | ICD-10-CM | POA: Diagnosis not present

## 2015-01-19 ENCOUNTER — Encounter: Payer: Medicare Other | Admitting: Family Medicine

## 2015-03-03 DIAGNOSIS — H2513 Age-related nuclear cataract, bilateral: Secondary | ICD-10-CM | POA: Diagnosis not present

## 2015-03-03 DIAGNOSIS — M79675 Pain in left toe(s): Secondary | ICD-10-CM | POA: Diagnosis not present

## 2015-03-03 DIAGNOSIS — B351 Tinea unguium: Secondary | ICD-10-CM | POA: Diagnosis not present

## 2015-03-03 DIAGNOSIS — M79674 Pain in right toe(s): Secondary | ICD-10-CM | POA: Diagnosis not present

## 2015-03-18 ENCOUNTER — Other Ambulatory Visit: Payer: Self-pay | Admitting: Family Medicine

## 2015-03-18 ENCOUNTER — Encounter: Payer: Self-pay | Admitting: Family Medicine

## 2015-03-18 ENCOUNTER — Ambulatory Visit (INDEPENDENT_AMBULATORY_CARE_PROVIDER_SITE_OTHER): Payer: Medicare Other | Admitting: Family Medicine

## 2015-03-18 VITALS — BP 134/68 | HR 82 | Temp 97.8°F | Resp 16 | Ht 65.0 in | Wt 191.2 lb

## 2015-03-18 DIAGNOSIS — I5032 Chronic diastolic (congestive) heart failure: Secondary | ICD-10-CM | POA: Diagnosis not present

## 2015-03-18 DIAGNOSIS — E1165 Type 2 diabetes mellitus with hyperglycemia: Secondary | ICD-10-CM

## 2015-03-18 DIAGNOSIS — Z23 Encounter for immunization: Secondary | ICD-10-CM | POA: Diagnosis not present

## 2015-03-18 DIAGNOSIS — I480 Paroxysmal atrial fibrillation: Secondary | ICD-10-CM | POA: Diagnosis not present

## 2015-03-18 DIAGNOSIS — E46 Unspecified protein-calorie malnutrition: Secondary | ICD-10-CM

## 2015-03-18 DIAGNOSIS — I10 Essential (primary) hypertension: Secondary | ICD-10-CM

## 2015-03-18 DIAGNOSIS — D638 Anemia in other chronic diseases classified elsewhere: Secondary | ICD-10-CM

## 2015-03-18 DIAGNOSIS — E1129 Type 2 diabetes mellitus with other diabetic kidney complication: Secondary | ICD-10-CM

## 2015-03-18 DIAGNOSIS — I700 Atherosclerosis of aorta: Secondary | ICD-10-CM

## 2015-03-18 DIAGNOSIS — E785 Hyperlipidemia, unspecified: Secondary | ICD-10-CM | POA: Diagnosis not present

## 2015-03-18 DIAGNOSIS — R911 Solitary pulmonary nodule: Secondary | ICD-10-CM

## 2015-03-18 DIAGNOSIS — Z0000 Encounter for general adult medical examination without abnormal findings: Secondary | ICD-10-CM

## 2015-03-18 DIAGNOSIS — IMO0002 Reserved for concepts with insufficient information to code with codable children: Secondary | ICD-10-CM

## 2015-03-18 DIAGNOSIS — Z794 Long term (current) use of insulin: Secondary | ICD-10-CM

## 2015-03-18 MED ORDER — GLUCOSE BLOOD VI STRP: ORAL_STRIP | Status: DC

## 2015-03-18 MED ORDER — GLUCOSE BLOOD VI STRP: 1.0000 | ORAL_STRIP | Freq: Three times a day (TID) | Status: DC

## 2015-03-18 NOTE — Telephone Encounter (Signed)
Sent to dr. Ancil Boozer

## 2015-03-18 NOTE — Progress Notes (Signed)
Name: Justin Manning   MRN: [id number]    DOB: 22-Jun-1935   Date:03/18/2015       Progress Note  Subjective  Chief Complaint  Chief Complaint  Patient presents with  . Annual Exam    HPI  Functional ability/safety issues: No Issues Hearing issues: Addressed  Activities of daily living: Discussed Home safety issues: No Issues  End Of Life Planning: Offered verbal information regarding advanced directives, healthcare power of attorney. Daughter takes care of him  Preventative care, Health maintenance, Preventative health measures discussed.  Preventative screenings discussed today: lab work, colonoscopy, PSA.  Men age 57 to 36 years if ever smoked recommended to get a one time AAA ultrasound screening exam. Not applicable  Low Dose CT Chest recommended if Age 72-80 years, 30 pack-year currently smoking OR have quit w/in 15years.   Lifestyle risk factor issued reviewed: Diet, exercise, weight management, advised patient smoking is not healthy, nutrition/diet.  Preventative health measures discussed (5-10 year plan).  Reviewed and recommended vaccinations: - Pneumovax  - Prevnar  - Annual Influenza - Zostavax - Tdap   Depression screening: Done Fall risk screening: Done Discuss ADLs/IADLs: Done  Current medical providers: See HPI  Other health risk factors identified this visit: No other issues Cognitive impairment issues: None identified  All above discussed with patient. Appropriate education, counseling and referral will be made based upon the above.   DMII: he states glucose has been better controlled, he states fsbs at home has been around 120's, highest recently has 125.  No polyphagia, polydipsia or polyuria - occasionally has nocturia.  He has been compliant with medication, but he forgot to renew his Medicare part D and is struggling financially, we gave him samples of Tudjeo today  Weight loss/protein malnutrition: lost 14lbs since last visit, 5 months ago.  He states he has been eating, he is not sure why he lost so much weight.   Dyslipidemia: taking Crestor 40 mg daily and denies side effects  HTN: taking bp medication, denies side effects of medications. No chest pain or palpitation  Afib: denies any symptoms, no palpitation.  Taking Pradaxa, denies bleeding  Lund nodule: seen on CT chest last year, incidental finding, during CT abdomen, he denies cough, no increase in SOB  Chronic CHF - Diastolic : uses 2 pills to sleep, no edema, denies PND, he has mild SOB with activity ,- walking a further distance, not when around the house.     Patient Active Problem List   Diagnosis Date Noted  . Protein malnutrition ([place]) 03/18/2015  . Anemia in chronic illness 11/09/2014  . Abdominal aortic atherosclerosis ([place]) 11/09/2014  . A-fib ([place]) 11/09/2014  . Atrial hypertrophy 11/09/2014  . Carotid arterial disease ([place]) 11/09/2014  . Chronic kidney disease (CKD), stage III (moderate) 11/09/2014  . Diabetes mellitus with renal manifestations, uncontrolled ([place]) 11/09/2014  . Diastolic dysfunction with heart failure ([place]) 11/09/2014  . Dysfunction of eustachian tube 11/09/2014  . Dyslipidemia 11/09/2014  . Essential (primary) hypertension 11/09/2014  . Gastro-esophageal reflux disease without esophagitis 11/09/2014  . Hearing loss of left ear 11/09/2014  . H/O carotid endarterectomy 11/09/2014  . H/O malignant neoplasm of colon 11/09/2014  . H/O infectious disease 11/09/2014  . H/O malignant neoplasm of prostate 11/09/2014  . Lung nodule, solitary 11/09/2014  . Diastolic dysfunction, left ventricle 11/09/2014  . Mild cognitive disorder 11/09/2014  . MI (mitral incompetence) 11/09/2014  . TI (tricuspid incompetence) 11/09/2014  . Dementia 11/07/2012    Past Surgical History  Procedure Laterality Date  . Endarterectomy Left     Carotid  . Shoulder surgery Right   . Knee surgery Right   . Partial colectomy      with Anastomosis     Family History  Problem Relation Age of Onset  . Alcohol abuse Brother   . Diabetes Daughter     Social History   Social History  . Marital Status: Single    Spouse Name: N/A  . Number of Children: 3  . Years of Education: 12th    Occupational History  . Not on file.   Social History Main Topics  . Smoking status: Former Smoker    Quit date: 05/15/1988  . Smokeless tobacco: Never Used  . Alcohol Use: No  . Drug Use: No  . Sexual Activity: Not Currently   Other Topics Concern  . Not on file   Social History Narrative     Current outpatient prescriptions:  .  CRESTOR 40 MG tablet, TAKE 1 TABLET BY MOUTH DAILY, Disp: 30 tablet, Rfl: 5 .  Cyanocobalamin (VITAMIN B 12) 100 MCG LOZG, Take by mouth., Disp: , Rfl:  .  dabigatran (PRADAXA) 75 MG CAPS capsule, Take 2 tablets by mouth 2 (two) times daily., Disp: , Rfl:  .  diltiazem (CARTIA XT) 240 MG 24 hr capsule, TK 1 C PO QD, Disp: , Rfl:  .  diltiazem (DILACOR XR) 240 MG 24 hr capsule, Take 1 capsule by mouth daily., Disp: , Rfl:  .  glucose blood (FREESTYLE LITE) test strip, CHECK BLOOD SUGAR THREE TIMES DAILY AS DIRECTED, Disp: 100 each, Rfl: 2 .  hydrALAZINE (APRESOLINE) 50 MG tablet, Take 1 tablet by mouth 4 (four) times daily., Disp: , Rfl:  .  hydrochlorothiazide (HYDRODIURIL) 25 MG tablet, Take 1 tablet by mouth daily., Disp: , Rfl:  .  Insulin Glargine (TOUJEO SOLOSTAR) 300 UNIT/ML SOPN, Inject 14 Units into the skin daily., Disp: 5 pen, Rfl: 2 .  Insulin Pen Needle (B-D ULTRAFINE III SHORT PEN) 31G X 8 MM MISC, U UTD, Disp: , Rfl:  .  lisinopril (PRINIVIL,ZESTRIL) 20 MG tablet, Take 1 tablet by mouth 2 (two) times daily., Disp: , Rfl:  .  NEEDLE, DISP, 20 G (BD DISP NEEDLES) 20G X 1" MISC, 1 Units by Does not apply route daily., Disp: 50 each, Rfl: 5 .  pantoprazole (PROTONIX) 40 MG tablet, Take 1 tablet by mouth 2 (two) times daily., Disp: , Rfl:   Allergies  Allergen Reactions  . Aspirin     tachycardia      ROS  Constitutional: Negative for fever, positive for  weight change - lost 14 lbs. Marland Kitchen  Respiratory: Negative for cough , mild stable shortness of breath.   Cardiovascular: Negative for chest pain or palpitations.  Gastrointestinal: Negative for abdominal pain, no bowel changes.  Musculoskeletal: Positive for gait problem - uses a cane or joint swelling.  Skin: Negative for rash.  Neurological: Negative for dizziness or headache.  No other specific complaints in a complete review of systems (except as listed in HPI above).  Objective  Filed Vitals:   03/18/15 1207  BP: 134/68  Pulse: 82  Temp: 97.8 F (36.6 C)  TempSrc: Oral  Resp: 16  Height: 5\' 5"  (1.651 m)  Weight: 191 lb 3.2 oz (86.728 kg)  SpO2: 98%    Body mass index is 31.82 kg/(m^2).  Physical Exam   Constitutional: Patient appears well-developed, still obese but lost 14 lbs since last visit. No distress.  HENT: Head: Normocephalic and atraumatic. Ears: B TMs ok, no erythema or effusion; Nose: Nose normal. Mouth/Throat: Oropharynx is clear and moist. No oropharyngeal exudate.  Eyes: Conjunctivae and EOM are normal. Pupils are equal, round, and reactive to light. No scleral icterus.  Neck: Normal range of motion. Neck supple. No JVD present. No thyromegaly present.  Cardiovascular: Normal rate, irregular rhythm and systolic ejection murmur heard mostly on 2nd left intercostal space. No BLE edema. Pulmonary/Chest: Effort normal and breath sounds normal. No respiratory distress. Abdominal: Soft. Bowel sounds are normal, no distension. There is no tenderness. no masses , scar from previous surgery MALE GENITALIA: Normal descended testes bilaterally, no masses palpated, no hernias, no lesions, no discharge RECTAL: Prostate normal size and consistency, no rectal masses or hemorrhoids Musculoskeletal: Normal range of motion, no joint effusions. No gross deformities Neurological: he is alert and oriented to person,  place, and time. No cranial nerve deficit. Coordination, balance, strength, speech and gait are normal.  Skin: Skin is warm and dry. No rash noted. No erythema.  Psychiatric: Patient has a normal mood and affect. behavior is normal. Judgment and thought content normal.  PHQ2/9: Depression screen [facility] 2/9 03/18/2015 11/12/2014  Decreased Interest 0 0  Down, Depressed, Hopeless 0 0  PHQ - 2 Score 0 0    Fall Risk: Fall Risk  03/18/2015 11/12/2014  Falls in the past year? No No    Functional Status Survey: Is the patient deaf or have difficulty hearing?: No Does the patient have difficulty seeing, even when wearing glasses/contacts?: Yes (glasses) Does the patient have difficulty concentrating, remembering, or making decisions?: No Does the patient have difficulty walking or climbing stairs?: Yes (uses cane) Does the patient have difficulty dressing or bathing?: No Does the patient have difficulty doing errands alone such as visiting a doctor's office or shopping?: Yes (doesn't drive)   Assessment & Plan  1. Medicare annual wellness visit, subsequent  Discussed importance of 150 minutes of physical activity weekly, eat two servings of fish weekly, eat one serving of tree nuts ( cashews, pistachios, pecans, almonds.Marland Kitchen) every other day, eat 6 servings of fruit/vegetables daily and drink plenty of water and avoid sweet beverages.  Discussed current USPTF guidelines during CPE  2. 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 Needs flu shot  - Flu vaccine HIGH DOSE PF (Fluzone High dose)  4. Protein malnutrition ([place])  - CBC with Differential/Platelet  5. Dyslipidemia  - Lipid panel  6. Anemia in chronic illness  - CBC with Differential/Platelet  7. Chronic diastolic heart failure (HCC)  - CBC with Differential/Platelet  8. Abdominal aortic atherosclerosis (HCC)  On pradaxa,  statin therapy  9. Paroxysmal atrial fibrillation (HCC)  Rate controlled  10. Essential (primary) hypertension  - Comprehensive metabolic panel - TSH  11. Need for pneumococcal vaccination  - Pneumococcal conjugate vaccine 13-valent IM

## 2015-03-26 ENCOUNTER — Ambulatory Visit: Payer: Medicare Other

## 2015-03-26 DIAGNOSIS — I5022 Chronic systolic (congestive) heart failure: Secondary | ICD-10-CM | POA: Diagnosis not present

## 2015-03-26 DIAGNOSIS — E46 Unspecified protein-calorie malnutrition: Secondary | ICD-10-CM | POA: Diagnosis not present

## 2015-03-26 DIAGNOSIS — E1129 Type 2 diabetes mellitus with other diabetic kidney complication: Secondary | ICD-10-CM | POA: Diagnosis not present

## 2015-03-26 DIAGNOSIS — I10 Essential (primary) hypertension: Secondary | ICD-10-CM | POA: Diagnosis not present

## 2015-03-26 DIAGNOSIS — E785 Hyperlipidemia, unspecified: Secondary | ICD-10-CM | POA: Diagnosis not present

## 2015-03-26 DIAGNOSIS — Z794 Long term (current) use of insulin: Secondary | ICD-10-CM | POA: Diagnosis not present

## 2015-03-26 DIAGNOSIS — I4891 Unspecified atrial fibrillation: Secondary | ICD-10-CM | POA: Diagnosis not present

## 2015-03-26 DIAGNOSIS — I517 Cardiomegaly: Secondary | ICD-10-CM | POA: Diagnosis not present

## 2015-03-26 DIAGNOSIS — I480 Paroxysmal atrial fibrillation: Secondary | ICD-10-CM | POA: Diagnosis not present

## 2015-03-26 DIAGNOSIS — I700 Atherosclerosis of aorta: Secondary | ICD-10-CM | POA: Diagnosis not present

## 2015-03-26 DIAGNOSIS — D638 Anemia in other chronic diseases classified elsewhere: Secondary | ICD-10-CM | POA: Diagnosis not present

## 2015-03-26 DIAGNOSIS — I5032 Chronic diastolic (congestive) heart failure: Secondary | ICD-10-CM | POA: Diagnosis not present

## 2015-03-26 DIAGNOSIS — E1165 Type 2 diabetes mellitus with hyperglycemia: Secondary | ICD-10-CM | POA: Diagnosis not present

## 2015-03-26 DIAGNOSIS — I519 Heart disease, unspecified: Secondary | ICD-10-CM | POA: Diagnosis not present

## 2015-03-27 LAB — CBC WITH DIFFERENTIAL/PLATELET
BASOS: 1 %
Basophils Absolute: 0 10*3/uL (ref 0.0–0.2)
EOS (ABSOLUTE): 0.2 10*3/uL (ref 0.0–0.4)
EOS: 2 %
Hematocrit: 41.4 % (ref 37.5–51.0)
Hemoglobin: 13.7 g/dL (ref 12.6–17.7)
IMMATURE GRANULOCYTES: 0 %
Immature Grans (Abs): 0 10*3/uL (ref 0.0–0.1)
LYMPHS: 21 %
Lymphocytes Absolute: 1.8 10*3/uL (ref 0.7–3.1)
MCH: 29.7 pg (ref 26.6–33.0)
MCHC: 33.1 g/dL (ref 31.5–35.7)
MCV: 90 fL (ref 79–97)
Monocytes Absolute: 0.7 10*3/uL (ref 0.1–0.9)
Monocytes: 8 %
NEUTROS ABS: 6.1 10*3/uL (ref 1.4–7.0)
NEUTROS PCT: 68 %
PLATELETS: 251 10*3/uL (ref 150–379)
RBC: 4.61 x10E6/uL (ref 4.14–5.80)
RDW: 15 % (ref 12.3–15.4)
WBC: 8.8 10*3/uL (ref 3.4–10.8)

## 2015-03-27 LAB — LIPID PANEL
CHOL/HDL RATIO: 2.8 ratio (ref 0.0–5.0)
CHOLESTEROL TOTAL: 115 mg/dL (ref 100–199)
HDL: 41 mg/dL (ref >39)
LDL CALC: 49 mg/dL (ref 0–99)
Triglycerides: 126 mg/dL (ref 0–149)
VLDL Cholesterol Cal: 25 mg/dL (ref 5–40)

## 2015-03-27 LAB — COMPREHENSIVE METABOLIC PANEL
ALT: 9 IU/L (ref 0–44)
AST: 15 IU/L (ref 0–40)
Albumin/Globulin Ratio: 1.3 (ref 1.1–2.5)
Albumin: 3.7 g/dL (ref 3.5–4.8)
Alkaline Phosphatase: 73 IU/L (ref 39–117)
BUN/Creatinine Ratio: 11 (ref 10–22)
BUN: 25 mg/dL (ref 8–27)
Bilirubin Total: 0.4 mg/dL (ref 0.0–1.2)
CALCIUM: 8.8 mg/dL (ref 8.6–10.2)
CO2: 29 mmol/L (ref 18–29)
CREATININE: 2.24 mg/dL — AB (ref 0.76–1.27)
Chloride: 97 mmol/L (ref 97–106)
GFR, EST AFRICAN AMERICAN: 32 mL/min/{1.73_m2} — AB (ref >59)
GFR, EST NON AFRICAN AMERICAN: 27 mL/min/{1.73_m2} — AB (ref >59)
Globulin, Total: 2.8 g/dL (ref 1.5–4.5)
Glucose: 200 mg/dL — ABNORMAL HIGH (ref 65–99)
Potassium: 3 mmol/L — ABNORMAL LOW (ref 3.5–5.2)
Sodium: 140 mmol/L (ref 136–144)
TOTAL PROTEIN: 6.5 g/dL (ref 6.0–8.5)

## 2015-03-27 LAB — TSH: TSH: 1.18 u[IU]/mL (ref 0.450–4.500)

## 2015-03-27 LAB — HEMOGLOBIN A1C
Est. average glucose Bld gHb Est-mCnc: 252 mg/dL
Hgb A1c MFr Bld: 10.4 % — ABNORMAL HIGH (ref 4.8–5.6)

## 2015-04-09 ENCOUNTER — Ambulatory Visit
Admission: RE | Admit: 2015-04-09 | Discharge: 2015-04-09 | Disposition: A | Discharge status: Home / Self Care | Payer: Medicare Other | Source: Ambulatory Visit | Attending: Family Medicine | Admitting: Family Medicine

## 2015-04-09 DIAGNOSIS — J479 Bronchiectasis, uncomplicated: Secondary | ICD-10-CM | POA: Insufficient documentation

## 2015-04-09 DIAGNOSIS — J439 Emphysema, unspecified: Secondary | ICD-10-CM | POA: Diagnosis not present

## 2015-04-09 DIAGNOSIS — R911 Solitary pulmonary nodule: Secondary | ICD-10-CM | POA: Diagnosis present

## 2015-04-09 DIAGNOSIS — K449 Diaphragmatic hernia without obstruction or gangrene: Secondary | ICD-10-CM | POA: Diagnosis not present

## 2015-04-09 DIAGNOSIS — R918 Other nonspecific abnormal finding of lung field: Secondary | ICD-10-CM | POA: Insufficient documentation

## 2015-04-09 DIAGNOSIS — I2510 Atherosclerotic heart disease of native coronary artery without angina pectoris: Secondary | ICD-10-CM | POA: Insufficient documentation

## 2015-04-11 DIAGNOSIS — J439 Emphysema, unspecified: Secondary | ICD-10-CM | POA: Insufficient documentation

## 2015-04-11 DIAGNOSIS — K449 Diaphragmatic hernia without obstruction or gangrene: Secondary | ICD-10-CM | POA: Insufficient documentation

## 2015-04-13 ENCOUNTER — Other Ambulatory Visit: Payer: Self-pay | Admitting: Family Medicine

## 2015-05-04 ENCOUNTER — Other Ambulatory Visit: Payer: Self-pay | Admitting: Family Medicine

## 2015-05-05 NOTE — Telephone Encounter (Signed)
Patient requesting refill. 

## 2015-05-06 NOTE — Telephone Encounter (Signed)
Left voice message to sch appointment

## 2015-05-18 ENCOUNTER — Ambulatory Visit (INDEPENDENT_AMBULATORY_CARE_PROVIDER_SITE_OTHER): Payer: Medicare Other | Admitting: Family Medicine

## 2015-05-18 ENCOUNTER — Encounter: Payer: Self-pay | Admitting: Family Medicine

## 2015-05-18 VITALS — BP 132/64 | HR 90 | Temp 97.6°F | Resp 16 | Wt 191.8 lb

## 2015-05-18 DIAGNOSIS — I5032 Chronic diastolic (congestive) heart failure: Secondary | ICD-10-CM | POA: Diagnosis not present

## 2015-05-18 DIAGNOSIS — E1129 Type 2 diabetes mellitus with other diabetic kidney complication: Secondary | ICD-10-CM

## 2015-05-18 DIAGNOSIS — E1165 Type 2 diabetes mellitus with hyperglycemia: Secondary | ICD-10-CM

## 2015-05-18 DIAGNOSIS — I700 Atherosclerosis of aorta: Secondary | ICD-10-CM | POA: Diagnosis not present

## 2015-05-18 DIAGNOSIS — I480 Paroxysmal atrial fibrillation: Secondary | ICD-10-CM | POA: Diagnosis not present

## 2015-05-18 DIAGNOSIS — Z794 Long term (current) use of insulin: Secondary | ICD-10-CM

## 2015-05-18 DIAGNOSIS — I10 Essential (primary) hypertension: Secondary | ICD-10-CM | POA: Diagnosis not present

## 2015-05-18 DIAGNOSIS — E785 Hyperlipidemia, unspecified: Secondary | ICD-10-CM

## 2015-05-18 DIAGNOSIS — E876 Hypokalemia: Secondary | ICD-10-CM | POA: Diagnosis not present

## 2015-05-18 DIAGNOSIS — IMO0002 Reserved for concepts with insufficient information to code with codable children: Secondary | ICD-10-CM

## 2015-05-18 DIAGNOSIS — K219 Gastro-esophageal reflux disease without esophagitis: Secondary | ICD-10-CM | POA: Diagnosis not present

## 2015-05-18 MED ORDER — LISINOPRIL 20 MG PO TABS: 20.0000 mg | ORAL_TABLET | Freq: Two times a day (BID) | ORAL | Status: DC

## 2015-05-18 MED ORDER — ROSUVASTATIN CALCIUM 40 MG PO TABS: 40.0000 mg | ORAL_TABLET | Freq: Every day | ORAL | Status: DC

## 2015-05-18 MED ORDER — DILTIAZEM HCL ER 240 MG PO CP24: 240.0000 mg | ORAL_CAPSULE | Freq: Every day | ORAL | Status: DC

## 2015-05-18 MED ORDER — PANTOPRAZOLE SODIUM 40 MG PO TBEC: 40.0000 mg | DELAYED_RELEASE_TABLET | Freq: Every day | ORAL | Status: DC

## 2015-05-18 MED ORDER — HYDROCHLOROTHIAZIDE 25 MG PO TABS: 25.0000 mg | ORAL_TABLET | Freq: Every day | ORAL | Status: DC

## 2015-05-18 MED ORDER — INSULIN ASPART 100 UNIT/ML FLEXPEN: 4.0000 [IU] | PEN_INJECTOR | Freq: Three times a day (TID) | SUBCUTANEOUS | Status: DC

## 2015-05-18 MED ORDER — INSULIN GLARGINE 100 UNIT/ML SOLOSTAR PEN: 20.0000 [IU] | PEN_INJECTOR | Freq: Every day | SUBCUTANEOUS | Status: DC

## 2015-05-18 NOTE — Progress Notes (Signed)
Name: Justin Manning   MRN: [id number]    DOB: 1935/11/01   Date:05/18/2015       Progress Note  Subjective  Chief Complaint  Chief Complaint  Patient presents with  . Medication Refill    HPI  DMII: he states glucose has been better controlled, he states fsbs at home has been around 116's-119. However daughter is worried that his meter is not right since hgbA1C in our office has been high. No polyphagia, polydipsia or polyuria - occasionally has nocturia. He has been compliant with medication. I gave him a prescription for pre-meal novolog but he never started, last hgbA1C 10.4%.   Dyslipidemia: taking Crestor 40 mg daily and denies side effects, no myalgia, he was having leg cramps but doing better now since he increase potassium in his diet  HTN: taking bp medication, denies side effects of medications. No chest pain or palpitation  Afib: denies any symptoms, no palpitation. Taking Pradaxa, denies bleeding  Lund nodule: seen on CT chest last year, incidental finding, during CT abdomen, he denies cough, no increase in SOB, smaller in size when rechecked in 03/2015. He has quit smoking over 25 years ago and we will not recheck it any longer  Chronic CHF - Diastolic : uses 2 pills to sleep, no edema, denies PND, he denies SOB with activity at this time. He sees Dr. Ubaldo Glassing every 6 months.    Atherosclerosis abdominal, carotid: he is on stating therapy, and bp is at goal. Needs to get diabetes under control  Patient Active Problem List   Diagnosis Date Noted  . Hiatal hernia 04/11/2015  . Emphysema lung ([place]) 04/11/2015  . Protein malnutrition ([place]) 03/18/2015  . Anemia in chronic illness 11/09/2014  . Abdominal aortic atherosclerosis ([place]) 11/09/2014  . A-fib ([place]) 11/09/2014  . Atrial hypertrophy 11/09/2014  . Carotid arterial disease ([place]) 11/09/2014  . Chronic kidney disease (CKD), stage III (moderate) 11/09/2014  . Diabetes mellitus with renal manifestations, uncontrolled  ([place]) 11/09/2014  . Diastolic dysfunction with heart failure ([place]) 11/09/2014  . Dysfunction of eustachian tube 11/09/2014  . Dyslipidemia 11/09/2014  . Essential (primary) hypertension 11/09/2014  . Gastro-esophageal reflux disease without esophagitis 11/09/2014  . Hearing loss of left ear 11/09/2014  . H/O carotid endarterectomy 11/09/2014  . H/O malignant neoplasm of colon 11/09/2014  . H/O infectious disease 11/09/2014  . H/O malignant neoplasm of prostate 11/09/2014  . Lung nodule, solitary 11/09/2014  . Mild cognitive disorder 11/09/2014  . TI (tricuspid incompetence) 11/09/2014    Past Surgical History  Procedure Laterality Date  . Endarterectomy Left     Carotid  . Shoulder surgery Right   . Knee surgery Right   . Partial colectomy      with Anastomosis    Family History  Problem Relation Age of Onset  . Alcohol abuse Brother   . Diabetes Daughter     Social History   Social History  . Marital Status: Single    Spouse Name: N/A  . Number of Children: 3  . Years of Education: 12th    Occupational History  . Not on file.   Social History Main Topics  . Smoking status: Former Smoker    Quit date: 05/15/1988  . Smokeless tobacco: Never Used  . Alcohol Use: No  . Drug Use: No  . Sexual Activity: Not Currently   Other Topics Concern  . Not on file   Social History Narrative     Current outpatient prescriptions:  .  Cyanocobalamin (VITAMIN B 12) 100 MCG LOZG, Take by mouth., Disp: , Rfl:  .  dabigatran (PRADAXA) 75 MG CAPS capsule, Take 2 tablets by mouth 2 (two) times daily., Disp: , Rfl:  .  diltiazem (DILACOR XR) 240 MG 24 hr capsule, Take 1 capsule (240 mg total) by mouth daily., Disp: 90 capsule, Rfl: 1 .  glucose blood (FREESTYLE LITE) test strip, 1 each by Other route 3 (three) times daily. Use as instructed, Disp: 300 each, Rfl: 2 .  hydrALAZINE (APRESOLINE) 50 MG tablet, Take 1 tablet by mouth 4 (four) times daily., Disp: , Rfl:  .   hydrochlorothiazide (HYDRODIURIL) 25 MG tablet, Take 1 tablet (25 mg total) by mouth daily., Disp: 90 tablet, Rfl: 1 .  Insulin Pen Needle (B-D ULTRAFINE III SHORT PEN) 31G X 8 MM MISC, U UTD, Disp: , Rfl:  .  lisinopril (PRINIVIL,ZESTRIL) 20 MG tablet, Take 1 tablet (20 mg total) by mouth 2 (two) times daily., Disp: 180 tablet, Rfl: 1 .  NEEDLE, DISP, 20 G (BD DISP NEEDLES) 20G X 1" MISC, 1 Units by Does not apply route daily., Disp: 50 each, Rfl: 5 .  pantoprazole (PROTONIX) 40 MG tablet, Take 1 tablet (40 mg total) by mouth daily., Disp: 90 tablet, Rfl: 1 .  rosuvastatin (CRESTOR) 40 MG tablet, Take 1 tablet (40 mg total) by mouth daily., Disp: 30 tablet, Rfl: 5 .  Insulin Glargine (LANTUS) 100 UNIT/ML Solostar Pen, Inject 20 Units into the skin daily., Disp: 15 mL, Rfl: 2  Allergies  Allergen Reactions  . Aspirin     tachycardia     ROS  Constitutional: Negative for fever or weight change.  Respiratory: Negative for cough and shortness of breath.   Cardiovascular: Negative for chest pain or palpitations.  Gastrointestinal: Negative for abdominal pain, no bowel changes.  Musculoskeletal: Positive for gait problem - uses a walker  No  joint swelling.  Skin: Negative for rash.  Neurological: Negative for dizziness or headache.  No other specific complaints in a complete review of systems (except as listed in HPI above).  Objective  Filed Vitals:   05/18/15 1100  BP: 132/64  Pulse: 90  Temp: 97.6 F (36.4 C)  TempSrc: Oral  Resp: 16  Weight: 191 lb 12.8 oz (87 kg)  SpO2: 98%    Body mass index is 31.92 kg/(m^2).  Physical Exam  Constitutional: Patient appears well-developed and well-nourished. Obese No distress.  HEENT: head atraumatic, normocephalic, pupils equal and reactive to lightneck supple, throat within normal limits Cardiovascular: irregular rate and  Rhythm. He has a systolic heart murmur No BLE edema. Pulmonary/Chest: Effort normal and breath sounds normal.  No respiratory distress. Abdominal: Soft.  There is no tenderness. Psychiatric: Patient has a normal mood and affect. behavior is normal. Judgment and thought content normal.  Recent Results (from the past 2160 hour(s))  Lipid panel     Status: None   Collection Time: 03/26/15  8:50 AM  Result Value Ref Range   Cholesterol, Total 115 100 - 199 mg/dL   Triglycerides 126 0 - 149 mg/dL   HDL 41 >39 mg/dL    Comment: According to ATP-III Guidelines, HDL-C >59 mg/dL is considered a negative risk factor for CHD.    VLDL Cholesterol Cal 25 5 - 40 mg/dL   LDL Calculated 49 0 - 99 mg/dL   Chol/HDL Ratio 2.8 0.0 - 5.0 ratio units    Comment:  T. Chol/HDL Ratio                                             Men  Women                               1/2 Avg.Risk  3.4    3.3                                   Avg.Risk  5.0    4.4                                2X Avg.Risk  9.6    7.1                                3X Avg.Risk 23.4   11.0   Comprehensive metabolic panel     Status: Abnormal   Collection Time: 03/26/15  8:50 AM  Result Value Ref Range   Glucose 200 (H) 65 - 99 mg/dL   BUN 25 8 - 27 mg/dL   Creatinine, Ser 2.24 (H) 0.76 - 1.27 mg/dL   GFR calc non Af Amer 27 (L) >59 mL/min/1.73   GFR calc Af Amer 32 (L) >59 mL/min/1.73   BUN/Creatinine Ratio 11 10 - 22   Sodium 140 136 - 144 mmol/L   Potassium 3.0 (L) 3.5 - 5.2 mmol/L   Chloride 97 97 - 106 mmol/L   CO2 29 18 - 29 mmol/L   Calcium 8.8 8.6 - 10.2 mg/dL   Total Protein 6.5 6.0 - 8.5 g/dL   Albumin 3.7 3.5 - 4.8 g/dL   Globulin, Total 2.8 1.5 - 4.5 g/dL   Albumin/Globulin Ratio 1.3 1.1 - 2.5   Bilirubin Total 0.4 0.0 - 1.2 mg/dL   Alkaline Phosphatase 73 39 - 117 IU/L   AST 15 0 - 40 IU/L   ALT 9 0 - 44 IU/L  CBC with Differential/Platelet     Status: None   Collection Time: 03/26/15  8:50 AM  Result Value Ref Range   WBC 8.8 3.4 - 10.8 x10E3/uL   RBC 4.61 4.14 - 5.80 x10E6/uL   Hemoglobin  13.7 12.6 - 17.7 g/dL   Hematocrit 41.4 37.5 - 51.0 %   MCV 90 79 - 97 fL   MCH 29.7 26.6 - 33.0 pg   MCHC 33.1 31.5 - 35.7 g/dL   RDW 15.0 12.3 - 15.4 %   Platelets 251 150 - 379 x10E3/uL   Neutrophils 68 %   Lymphs 21 %   Monocytes 8 %   Eos 2 %   Basos 1 %   Neutrophils Absolute 6.1 1.4 - 7.0 x10E3/uL   Lymphocytes Absolute 1.8 0.7 - 3.1 x10E3/uL   Monocytes Absolute 0.7 0.1 - 0.9 x10E3/uL   EOS (ABSOLUTE) 0.2 0.0 - 0.4 x10E3/uL   Basophils Absolute 0.0 0.0 - 0.2 x10E3/uL   Immature Granulocytes 0 %   Immature Grans (Abs) 0.0 0.0 - 0.1 x10E3/uL  Hemoglobin A1c     Status: Abnormal   Collection Time: 03/26/15  8:50 AM  Result Value Ref Range  Hgb A1c MFr Bld 10.4 (H) 4.8 - 5.6 %    Comment:          Pre-diabetes: 5.7 - 6.4          Diabetes: >6.4          Glycemic control for adults with diabetes: <7.0    Est. average glucose Bld gHb Est-mCnc 252 mg/dL  TSH     Status: None   Collection Time: 03/26/15  8:50 AM  Result Value Ref Range   TSH 1.180 0.450 - 4.500 uIU/mL     PHQ2/9: Depression screen [facility] 2/9 05/18/2015 03/18/2015 11/12/2014  Decreased Interest 0 0 0  Down, Depressed, Hopeless 0 0 0  PHQ - 2 Score 0 0 0     Fall Risk: Fall Risk  05/18/2015 03/18/2015 11/12/2014  Falls in the past year? No No No     Functional Status Survey: Is the patient deaf or have difficulty hearing?: No Does the patient have difficulty seeing, even when wearing glasses/contacts?: No Does the patient have difficulty concentrating, remembering, or making decisions?: No Does the patient have difficulty walking or climbing stairs?: No Does the patient have difficulty dressing or bathing?: No Does the patient have difficulty doing errands alone such as visiting a doctor's office or shopping?: No    Assessment & Plan    1. 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  - Insulin Glargine (LANTUS) 100 UNIT/ML Solostar Pen; Inject 20 Units into the skin daily.  Dispense: 15 mL; Refill: 2 - insulin aspart (NOVOLOG FLEXPEN) 100 UNIT/ML FlexPen; Inject 4-6 Units into the skin 3 (three) times daily with meals.  Dispense: 15 mL; Refill: 2  2. Dyslipidemia  - rosuvastatin (CRESTOR) 40 MG tablet; Take 1 tablet (40 mg total) by mouth daily.  Dispense: 30 tablet; Refill: 5  3. Essential (primary) hypertension  At goal  - lisinopril (PRINIVIL,ZESTRIL) 20 MG tablet; Take 1 tablet (20 mg total) by mouth 2 (two) times daily.  Dispense: 180 tablet; Refill: 1 - hydrochlorothiazide (HYDRODIURIL) 25 MG tablet; Take 1 tablet (25 mg total) by mouth daily.  Dispense: 90 tablet; Refill: 1 - diltiazem (DILACOR XR) 240 MG 24 hr capsule; Take 1 capsule (240 mg total) by mouth daily.  Dispense: 90 capsule; Refill: 1  4. Paroxysmal atrial fibrillation (HCC)  - diltiazem (DILACOR XR) 240 MG 24 hr capsule; Take 1 capsule (240 mg total) by mouth daily.  Dispense: 90 capsule; Refill: 1  5. Abdominal aortic atherosclerosis (HCC)  Continue statin therapy and bp under control   6. Chronic diastolic heart failure (HCC)  - lisinopril (PRINIVIL,ZESTRIL) 20 MG tablet; Take 1 tablet (20 mg total) by mouth 2 (two) times daily.  Dispense: 180 tablet; Refill: 1 - diltiazem (DILACOR XR) 240 MG 24 hr capsule; Take 1 capsule (240 mg total) by mouth daily.  Dispense: 90 capsule; Refill: 1  7. Gastro-esophageal reflux disease without esophagitis  Weaning self off, down to one pill daily and will try to alternate with zantac - pantoprazole (PROTONIX) 40 MG tablet; Take 1 tablet (40 mg total) by mouth daily.  Dispense: 90 tablet; Refill: 1  8. Hypokalemia  Discussed importance of potassium rich diet

## 2015-06-23 ENCOUNTER — Ambulatory Visit: Payer: Medicare Other | Admitting: Family Medicine

## 2015-06-24 ENCOUNTER — Other Ambulatory Visit: Payer: Self-pay | Admitting: Family Medicine

## 2015-06-25 ENCOUNTER — Ambulatory Visit (INDEPENDENT_AMBULATORY_CARE_PROVIDER_SITE_OTHER): Payer: Medicare Other | Admitting: Family Medicine

## 2015-06-25 ENCOUNTER — Encounter: Payer: Self-pay | Admitting: Family Medicine

## 2015-06-25 VITALS — BP 122/70 | HR 87 | Temp 97.8°F | Resp 16 | Wt 193.6 lb

## 2015-06-25 DIAGNOSIS — E1129 Type 2 diabetes mellitus with other diabetic kidney complication: Secondary | ICD-10-CM

## 2015-06-25 DIAGNOSIS — Z794 Long term (current) use of insulin: Secondary | ICD-10-CM | POA: Diagnosis not present

## 2015-06-25 DIAGNOSIS — IMO0002 Reserved for concepts with insufficient information to code with codable children: Secondary | ICD-10-CM

## 2015-06-25 DIAGNOSIS — J069 Acute upper respiratory infection, unspecified: Secondary | ICD-10-CM | POA: Diagnosis not present

## 2015-06-25 DIAGNOSIS — E1165 Type 2 diabetes mellitus with hyperglycemia: Secondary | ICD-10-CM | POA: Diagnosis not present

## 2015-06-25 MED ORDER — UMECLIDINIUM BROMIDE 62.5 MCG/INH IN AEPB: 1.0000 | INHALATION_SPRAY | Freq: Every day | RESPIRATORY_TRACT | Status: DC

## 2015-06-25 NOTE — Progress Notes (Signed)
Name: Justin Manning   MRN: [id number]    DOB: [birth year]   Date:06/25/2015       Progress Note  Subjective  Chief Complaint  Chief Complaint  Patient presents with  . URI    patient presents with chest congestion. patient has taken some otc sugar-free cough syrup and lozengers.  . Medication Refill    patient stated that he had to pick up some printed rx's.    HPI  DMII with renal manifestation: he was seen in November and hgbA1C was very high at 10.4 %, he has been taking a long activint insulin ( samples of Toujeo ) before bed, but out of  only using Novolog for months secondary to cost. He denies blurred vision, no polyphagia, polydipsia or polyuria. He sees nephrologist. He had a mild drop on GFR on his last labs. On ACE  URI: He states symptoms started about one week ago. Scratchy throat, dry cough, rhinorrhea and nasal congestion. No fever, no chills, no change of appetite.. He has been taking Robitussin sugar free, but cough lozanges that are not sugar free.    Patient Active Problem List   Diagnosis Date Noted  . Hiatal hernia 04/11/2015  . Emphysema lung ([place]) 04/11/2015  . Protein malnutrition ([place]) 03/18/2015  . Anemia in chronic illness 11/09/2014  . Abdominal aortic atherosclerosis ([place]) 11/09/2014  . A-fib ([place]) 11/09/2014  . Atrial hypertrophy 11/09/2014  . Carotid arterial disease ([place]) 11/09/2014  . Chronic kidney disease (CKD), stage III (moderate) 11/09/2014  . Diabetes mellitus with renal manifestations, uncontrolled ([place]) 11/09/2014  . Diastolic dysfunction with heart failure ([place]) 11/09/2014  . Dysfunction of eustachian tube 11/09/2014  . Dyslipidemia 11/09/2014  . Essential (primary) hypertension 11/09/2014  . Gastro-esophageal reflux disease without esophagitis 11/09/2014  . Hearing loss of left ear 11/09/2014  . H/O carotid endarterectomy 11/09/2014  . H/O malignant neoplasm of colon 11/09/2014  . H/O infectious disease 11/09/2014  . H/O malignant  neoplasm of prostate 11/09/2014  . Lung nodule, solitary 11/09/2014  . Mild cognitive disorder 11/09/2014  . TI (tricuspid incompetence) 11/09/2014    Past Surgical History  Procedure Laterality Date  . Endarterectomy Left     Carotid  . Shoulder surgery Right   . Knee surgery Right   . Partial colectomy      with Anastomosis    Family History  Problem Relation Age of Onset  . Alcohol abuse Brother   . Diabetes Daughter     Social History   Social History  . Marital Status: Single    Spouse Name: N/A  . Number of Children: 3  . Years of Education: 12th    Occupational History  . Not on file.   Social History Main Topics  . Smoking status: Former Smoker    Quit date: 05/15/1988  . Smokeless tobacco: Never Used  . Alcohol Use: No  . Drug Use: No  . Sexual Activity: Not Currently   Other Topics Concern  . Not on file   Social History Narrative     Current outpatient prescriptions:  .  Cyanocobalamin (VITAMIN B 12) 100 MCG LOZG, Take by mouth., Disp: , Rfl:  .  dabigatran (PRADAXA) 75 MG CAPS capsule, Take 2 tablets by mouth 2 (two) times daily., Disp: , Rfl:  .  diltiazem (DILACOR XR) 240 MG 24 hr capsule, Take 1 capsule (240 mg total) by mouth daily., Disp: 90 capsule, Rfl: 1 .  glucose blood (FREESTYLE LITE) test strip, 1 each by  Other route 3 (three) times daily. Use as instructed, Disp: 300 each, Rfl: 2 .  hydrALAZINE (APRESOLINE) 50 MG tablet, Take 1 tablet by mouth 4 (four) times daily., Disp: , Rfl:  .  hydrochlorothiazide (HYDRODIURIL) 25 MG tablet, Take 1 tablet (25 mg total) by mouth daily., Disp: 90 tablet, Rfl: 1 .  insulin aspart (NOVOLOG FLEXPEN) 100 UNIT/ML FlexPen, Inject 4-6 Units into the skin 3 (three) times daily with meals., Disp: 15 mL, Rfl: 2 .  Insulin Glargine (LANTUS) 100 UNIT/ML Solostar Pen, Inject 20 Units into the skin daily., Disp: 15 mL, Rfl: 2 .  Insulin Pen Needle (B-D ULTRAFINE III SHORT PEN) 31G X 8 MM MISC, U UTD, Disp: , Rfl:   .  lisinopril (PRINIVIL,ZESTRIL) 20 MG tablet, Take 1 tablet (20 mg total) by mouth 2 (two) times daily., Disp: 180 tablet, Rfl: 1 .  NEEDLE, DISP, 20 G (BD DISP NEEDLES) 20G X 1" MISC, 1 Units by Does not apply route daily., Disp: 50 each, Rfl: 5 .  pantoprazole (PROTONIX) 40 MG tablet, TAKE 1 TABLET BY MOUTH TWICE DAILY, Disp: 60 tablet, Rfl: 5 .  rosuvastatin (CRESTOR) 40 MG tablet, Take 1 tablet (40 mg total) by mouth daily., Disp: 30 tablet, Rfl: 5  Allergies  Allergen Reactions  . Aspirin     tachycardia     ROS  Constitutional: Negative for fever or weight change.  Respiratory: Positive  for cough , no increase in  shortness of breath.   Cardiovascular: Negative for chest pain or palpitations.  Gastrointestinal: Negative for abdominal pain, no bowel changes.  Musculoskeletal: Positive for gait problem no  joint swelling.  Skin: Negative for rash.  Neurological: Negative for dizziness or headache.  No other specific complaints in a complete review of systems (except as listed in HPI above).  Objective  Filed Vitals:   06/25/15 1453  BP: 122/70  Pulse: 87  Temp: 97.8 F (36.6 C)  TempSrc: Oral  Resp: 16  Weight: 193 lb 9.6 oz (87.816 kg)  SpO2: 98%    Body mass index is 32.22 kg/(m^2).  Physical Exam  Constitutional: Patient appears well-developed and well-nourished. Obese  No distress.  HEENT: head atraumatic, normocephalic, pupils equal and reactive to light, ears normal bilaterally, neck supple, throat within normal limits, no post-nasal drainage Cardiovascular:irregular rate and  rhythm.  No murmur heard. No BLE edema. Pulmonary/Chest: Effort normal and breath sounds normal. No respiratory distress.  Abdominal: Soft.  There is no tenderness. Psychiatric: Patient has a normal mood and affect. behavior is normal. Judgment and thought content normal.  PHQ2/9: Depression screen [facility] 2/9 06/25/2015 05/18/2015 03/18/2015 11/12/2014  Decreased Interest 0 0 0 0  Down,  Depressed, Hopeless 0 0 0 0  PHQ - 2 Score 0 0 0 0    Fall Risk: Fall Risk  06/25/2015 05/18/2015 03/18/2015 11/12/2014  Falls in the past year? No No No No     Functional Status Survey: Is the patient deaf or have difficulty hearing?: No Does the patient have difficulty seeing, even when wearing glasses/contacts?: No Does the patient have difficulty concentrating, remembering, or making decisions?: No Does the patient have difficulty walking or climbing stairs?: No Does the patient have difficulty dressing or bathing?: No Does the patient have difficulty doing errands alone such as visiting a doctor's office or shopping?: No    Assessment & Plan  1. Uncontrolled type 2 diabetes mellitus with other diabetic kidney complication, with long-term current use of insulin ([place])  He needs  to resume insulin and diet, recheck labs on his follow up next monthj  2. Upper respiratory infection  Change to sugar free lozenges, can continue otc medication, we will try Incruse for cough symptoms and chest congestion  - Umeclidinium Bromide (INCRUSE ELLIPTA) 62.5 MCG/INH AEPB; Inhale 1 puff into the lungs daily.  Dispense: 30 each; Refill: 0

## 2015-06-25 NOTE — Telephone Encounter (Signed)
Patient requesting refill. 

## 2015-07-30 DIAGNOSIS — R809 Proteinuria, unspecified: Secondary | ICD-10-CM | POA: Diagnosis not present

## 2015-07-30 DIAGNOSIS — E876 Hypokalemia: Secondary | ICD-10-CM | POA: Diagnosis not present

## 2015-07-30 DIAGNOSIS — M79674 Pain in right toe(s): Secondary | ICD-10-CM | POA: Diagnosis not present

## 2015-07-30 DIAGNOSIS — M79675 Pain in left toe(s): Secondary | ICD-10-CM | POA: Diagnosis not present

## 2015-07-30 DIAGNOSIS — E1122 Type 2 diabetes mellitus with diabetic chronic kidney disease: Secondary | ICD-10-CM | POA: Diagnosis not present

## 2015-07-30 DIAGNOSIS — I129 Hypertensive chronic kidney disease with stage 1 through stage 4 chronic kidney disease, or unspecified chronic kidney disease: Secondary | ICD-10-CM | POA: Diagnosis not present

## 2015-07-30 DIAGNOSIS — N183 Chronic kidney disease, stage 3 (moderate): Secondary | ICD-10-CM | POA: Diagnosis not present

## 2015-07-30 DIAGNOSIS — B351 Tinea unguium: Secondary | ICD-10-CM | POA: Diagnosis not present

## 2015-08-02 ENCOUNTER — Telehealth: Payer: Self-pay

## 2015-08-02 NOTE — Telephone Encounter (Signed)
Patient left a message on the nurse line stating he needed a refill of his insulin. I called to find out which one he needed, but there was no answer. A message was left for her to give [place] a call when she got the chance.

## 2015-08-16 ENCOUNTER — Encounter: Payer: Self-pay | Admitting: Family Medicine

## 2015-08-16 ENCOUNTER — Ambulatory Visit (INDEPENDENT_AMBULATORY_CARE_PROVIDER_SITE_OTHER): Payer: Medicare Other | Admitting: Family Medicine

## 2015-08-16 VITALS — BP 118/72 | HR 95 | Temp 97.6°F | Resp 16 | Wt 195.2 lb

## 2015-08-16 DIAGNOSIS — N183 Chronic kidney disease, stage 3 unspecified: Secondary | ICD-10-CM

## 2015-08-16 DIAGNOSIS — E1122 Type 2 diabetes mellitus with diabetic chronic kidney disease: Secondary | ICD-10-CM

## 2015-08-16 DIAGNOSIS — E1165 Type 2 diabetes mellitus with hyperglycemia: Secondary | ICD-10-CM | POA: Diagnosis not present

## 2015-08-16 DIAGNOSIS — E785 Hyperlipidemia, unspecified: Secondary | ICD-10-CM | POA: Diagnosis not present

## 2015-08-16 DIAGNOSIS — I5032 Chronic diastolic (congestive) heart failure: Secondary | ICD-10-CM

## 2015-08-16 DIAGNOSIS — I10 Essential (primary) hypertension: Secondary | ICD-10-CM

## 2015-08-16 DIAGNOSIS — I700 Atherosclerosis of aorta: Secondary | ICD-10-CM | POA: Diagnosis not present

## 2015-08-16 DIAGNOSIS — IMO0002 Reserved for concepts with insufficient information to code with codable children: Secondary | ICD-10-CM

## 2015-08-16 DIAGNOSIS — Z794 Long term (current) use of insulin: Secondary | ICD-10-CM

## 2015-08-16 LAB — POCT GLYCOSYLATED HEMOGLOBIN (HGB A1C): Hemoglobin A1C: 12.2

## 2015-08-16 MED ORDER — METFORMIN HCL ER 750 MG PO TB24: 750.0000 mg | ORAL_TABLET | Freq: Every day | ORAL | Status: DC

## 2015-08-16 MED ORDER — INSULIN GLARGINE 100 UNIT/ML SOLOSTAR PEN: 20.0000 [IU] | PEN_INJECTOR | Freq: Every day | SUBCUTANEOUS | Status: DC

## 2015-08-16 MED ORDER — INSULIN ASPART 100 UNIT/ML FLEXPEN: 4.0000 [IU] | PEN_INJECTOR | Freq: Three times a day (TID) | SUBCUTANEOUS | Status: DC

## 2015-08-16 NOTE — Progress Notes (Signed)
Name: Justin Manning   MRN: [id number]    DOB: 11/13/1935   Date:08/16/2015       Progress Note  Subjective  Chief Complaint  Chief Complaint  Patient presents with  . Diabetes    highest 115 & lowest 76  . Hypertension  . Gastroesophageal Reflux  . Hypokalemia  . Dyslipidemia  . Atrial Fibrillation    paroxysmal  . Chronic Diastolic Heart Failure  . Abdominal Aortic Atherosclerosis  . Medication Refill    HPI  DMII: he brought his sugar log from home and is consistent at goal in the 115's range. His hgbA1C went up from 10.4 to 12.2 %. Daughter states he is still using Toujeo samples, never switched back to Lantus and is not using Novolog as prescribed. He is willing to add Metformin. He denies polydipsia, polyuria or polyphagia. No blurred vision. Daughter is wondering if he is not writing the values correctly at home. He has CKI and sees Dr. Abigail Butts he is on Ace.   Dyslipidemia: taking Crestor 40 mg daily and denies side effects, no myalgia, he was having leg cramps but doing better now since he increase potassium in his diet  HTN: taking bp medication, denies side effects of medications. No chest pain or palpitation. Seeing Dr. Abigail Butts  Afib: denies any symptoms, no palpitation. Taking Pradaxa, denies bleeding   Lund nodule: seen on CT chest last year, incidental finding, during CT abdomen, he denies cough, no increase in SOB, smaller in size when rechecked in 03/2015. He has quit smoking over 25 years ago and we will not recheck it any longer  Chronic CHF - Diastolic : uses 2 pills to sleep, no edema, denies PND, he denies SOB with activity at this time, no wheezing. He sees Dr. Ubaldo Glassing every 6 months.   Atherosclerosis abdominal, carotid: he is on stating therapy, and bp is at goal. Needs to get diabetes under control   Patient Active Problem List   Diagnosis Date Noted  . Hiatal hernia 04/11/2015  . Emphysema lung ([place]) 04/11/2015  . Protein malnutrition ([place])  03/18/2015  . Anemia in chronic illness 11/09/2014  . Abdominal aortic atherosclerosis ([place]) 11/09/2014  . A-fib ([place]) 11/09/2014  . Atrial hypertrophy 11/09/2014  . Carotid arterial disease ([place]) 11/09/2014  . Chronic kidney disease (CKD), stage III (moderate) 11/09/2014  . Diabetes mellitus with renal manifestations, uncontrolled ([place]) 11/09/2014  . Diastolic dysfunction with heart failure ([place]) 11/09/2014  . Dysfunction of eustachian tube 11/09/2014  . Dyslipidemia 11/09/2014  . Essential (primary) hypertension 11/09/2014  . Gastro-esophageal reflux disease without esophagitis 11/09/2014  . Hearing loss of left ear 11/09/2014  . H/O carotid endarterectomy 11/09/2014  . H/O malignant neoplasm of colon 11/09/2014  . H/O infectious disease 11/09/2014  . H/O malignant neoplasm of prostate 11/09/2014  . Lung nodule, solitary 11/09/2014  . Mild cognitive disorder 11/09/2014  . TI (tricuspid incompetence) 11/09/2014    Past Surgical History  Procedure Laterality Date  . Endarterectomy Left     Carotid  . Shoulder surgery Right   . Knee surgery Right   . Partial colectomy      with Anastomosis    Family History  Problem Relation Age of Onset  . Alcohol abuse Brother   . Diabetes Daughter     Social History   Social History  . Marital Status: Single    Spouse Name: N/A  . Number of Children: 3  . Years of Education: 12th    Occupational History  .  Not on file.   Social History Main Topics  . Smoking status: Former Smoker    Quit date: 05/15/1988  . Smokeless tobacco: Never Used  . Alcohol Use: No  . Drug Use: No  . Sexual Activity: Not Currently   Other Topics Concern  . Not on file   Social History Narrative     Current outpatient prescriptions:  .  Cyanocobalamin (VITAMIN B 12) 100 MCG LOZG, Take by mouth., Disp: , Rfl:  .  dabigatran (PRADAXA) 75 MG CAPS capsule, Take 2 tablets by mouth 2 (two) times daily., Disp: , Rfl:  .  diltiazem (DILACOR XR) 240  MG 24 hr capsule, Take 1 capsule (240 mg total) by mouth daily., Disp: 90 capsule, Rfl: 1 .  glucose blood (FREESTYLE LITE) test strip, 1 each by Other route 3 (three) times daily. Use as instructed, Disp: 300 each, Rfl: 2 .  hydrALAZINE (APRESOLINE) 50 MG tablet, Take 1 tablet by mouth 4 (four) times daily., Disp: , Rfl:  .  hydrochlorothiazide (HYDRODIURIL) 25 MG tablet, Take 1 tablet (25 mg total) by mouth daily., Disp: 90 tablet, Rfl: 1 .  insulin aspart (NOVOLOG FLEXPEN) 100 UNIT/ML FlexPen, Inject 4-6 Units into the skin 3 (three) times daily with meals., Disp: 15 mL, Rfl: 2 .  Insulin Glargine (LANTUS) 100 UNIT/ML Solostar Pen, Inject 20 Units into the skin daily., Disp: 15 mL, Rfl: 2 .  Insulin Pen Needle (B-D ULTRAFINE III SHORT PEN) 31G X 8 MM MISC, U UTD, Disp: , Rfl:  .  lisinopril (PRINIVIL,ZESTRIL) 20 MG tablet, Take 1 tablet (20 mg total) by mouth 2 (two) times daily., Disp: 180 tablet, Rfl: 1 .  NEEDLE, DISP, 20 G (BD DISP NEEDLES) 20G X 1" MISC, 1 Units by Does not apply route daily., Disp: 50 each, Rfl: 5 .  pantoprazole (PROTONIX) 40 MG tablet, TAKE 1 TABLET BY MOUTH TWICE DAILY, Disp: 60 tablet, Rfl: 5 .  rosuvastatin (CRESTOR) 40 MG tablet, Take 1 tablet (40 mg total) by mouth daily., Disp: 30 tablet, Rfl: 5  Allergies  Allergen Reactions  . Aspirin     tachycardia     ROS  Constitutional: Negative for fever or weight change.  Respiratory: Negative for cough and shortness of breath.   Cardiovascular: Negative for chest pain or palpitations.  Gastrointestinal: Negative for abdominal pain, no bowel changes.  Musculoskeletal: Negative for gait problem or joint swelling.  Skin: Negative for rash.  Neurological: Negative for dizziness or headache.  No other specific complaints in a complete review of systems (except as listed in HPI above).  Objective  Filed Vitals:   08/16/15 0927  BP: 118/72  Pulse: 95  Temp: 97.6 F (36.4 C)  TempSrc: Oral  Resp: 16  Weight:  195 lb 3.2 oz (88.542 kg)  SpO2: 97%    Body mass index is 32.48 kg/(m^2).  Physical Exam  Constitutional: Patient appears well-developed and well-nourished.No distress.  HEENT: head atraumatic, normocephalic, pupils equal and reactive to light, neck supple, throat within normal limits Cardiovascular: irregular heart rate, and systolic ejection murmur most audible on 2nd intercostal space . No BLE edema. Pulmonary/Chest: Effort normal and breath sounds normal. No respiratory distress. Abdominal: Soft.  There is no tenderness. Psychiatric: Patient has a normal mood and affect. behavior is normal. Judgment and thought content normal. Muscular Skeletal: crepitus with extension of both knees, uses cane to help with ambulation   Recent Results (from the past 2160 hour(s))  POCT glycosylated hemoglobin (Hb A1C)  Status: Abnormal   Collection Time: 08/16/15  9:38 AM  Result Value Ref Range   Hemoglobin A1C 12.2     PHQ2/9: Depression screen [facility] 2/9 08/16/2015 06/25/2015 05/18/2015 03/18/2015 11/12/2014  Decreased Interest 0 0 0 0 0  Down, Depressed, Hopeless 0 0 0 0 0  PHQ - 2 Score 0 0 0 0 0     Fall Risk: Fall Risk  08/16/2015 06/25/2015 05/18/2015 03/18/2015 11/12/2014  Falls in the past year? No No No No No     Functional Status Survey: Is the patient deaf or have difficulty hearing?: No Does the patient have difficulty seeing, even when wearing glasses/contacts?: No Does the patient have difficulty concentrating, remembering, or making decisions?: No Does the patient have difficulty walking or climbing stairs?: No Does the patient have difficulty dressing or bathing?: No Does the patient have difficulty doing errands alone such as visiting a doctor's office or shopping?: No    Assessment & Plan  1. Uncontrolled type 2 diabetes mellitus with stage 3 chronic kidney disease, with long-term current use of insulin ([place])  I explained to him that his glucose at home does not have  enough variation - and it was low at home this am, but is up to 198 here in the office. His daughter will start checking it at home. We will refer him to Endocrinologist  - POCT glycosylated hemoglobin (Hb A1C) - Insulin Glargine (LANTUS) 100 UNIT/ML Solostar Pen; Inject 20 Units into the skin daily.  Dispense: 15 mL; Refill: 2 - insulin aspart (NOVOLOG FLEXPEN) 100 UNIT/ML FlexPen; Inject 4-6 Units into the skin 3 (three) times daily with meals.  Dispense: 15 mL; Refill: 2 - metFORMIN (GLUCOPHAGE-XR) 750 MG 24 hr tablet; Take 1 tablet (750 mg total) by mouth daily with breakfast.  Dispense: 90 tablet; Refill: 1 -Endocrinologist referral  2. Dyslipidemia  Continue Crestor  3. Chronic kidney disease (CKD), stage III (moderate)  Continue follow up with Dr. Abigail Butts  4. Abdominal aortic atherosclerosis (HCC)  Continue statin and blood thinners  5. Chronic diastolic heart failure (HCC)  stable  6. Essential (primary) hypertension  Well controlled at this time

## 2015-08-18 ENCOUNTER — Telehealth: Payer: Self-pay

## 2015-08-18 ENCOUNTER — Other Ambulatory Visit: Payer: Self-pay | Admitting: Family Medicine

## 2015-08-18 MED ORDER — INSULIN LISPRO 100 UNIT/ML CARTRIDGE: 4.0000 [IU] | Freq: Three times a day (TID) | SUBCUTANEOUS | Status: DC

## 2015-08-18 NOTE — Telephone Encounter (Signed)
Patient requesting refill. 

## 2015-08-18 NOTE — Telephone Encounter (Signed)
Daughter stated that his BS has dropped to 210 but she wanted to make sure that it did not go too high. Clarene Critchley was informed that if his Blood sugar went over 400 then she should take him to the ER. She said ok.

## 2015-08-18 NOTE — Telephone Encounter (Signed)
I will do the PA but, I can't guarantee that it will get approved, Dr.  Ancil Boozer might have to switch it to Humulog instead if they don't cover the Novolog. I'll notify you once complete

## 2015-08-18 NOTE — Telephone Encounter (Signed)
Patient's daughter called stating he needed a prior auth for the novolog. He checked it and his blood sugar was in the 300's.  She really wants this done today so as she stated "didn't want to go to his house and find him dead or something."

## 2015-08-18 NOTE — Telephone Encounter (Signed)
I will change to Humalog, in place of Novolog. His glucose has been in the 300's for the past 6 months, based on hgbA1C. It will improve once he takes medication as prescribed

## 2015-08-30 DIAGNOSIS — N183 Chronic kidney disease, stage 3 (moderate): Secondary | ICD-10-CM | POA: Diagnosis not present

## 2015-08-30 DIAGNOSIS — E1122 Type 2 diabetes mellitus with diabetic chronic kidney disease: Secondary | ICD-10-CM | POA: Diagnosis not present

## 2015-08-30 DIAGNOSIS — Z79899 Other long term (current) drug therapy: Secondary | ICD-10-CM | POA: Diagnosis not present

## 2015-08-30 DIAGNOSIS — Z794 Long term (current) use of insulin: Secondary | ICD-10-CM | POA: Diagnosis not present

## 2015-09-21 DIAGNOSIS — N183 Chronic kidney disease, stage 3 (moderate): Secondary | ICD-10-CM | POA: Diagnosis not present

## 2015-09-21 DIAGNOSIS — Z794 Long term (current) use of insulin: Secondary | ICD-10-CM | POA: Diagnosis not present

## 2015-09-21 DIAGNOSIS — Z79899 Other long term (current) drug therapy: Secondary | ICD-10-CM | POA: Diagnosis not present

## 2015-09-21 DIAGNOSIS — E1122 Type 2 diabetes mellitus with diabetic chronic kidney disease: Secondary | ICD-10-CM | POA: Diagnosis not present

## 2015-10-08 DIAGNOSIS — I5022 Chronic systolic (congestive) heart failure: Secondary | ICD-10-CM | POA: Diagnosis not present

## 2015-10-08 DIAGNOSIS — E119 Type 2 diabetes mellitus without complications: Secondary | ICD-10-CM | POA: Diagnosis not present

## 2015-10-08 DIAGNOSIS — I4891 Unspecified atrial fibrillation: Secondary | ICD-10-CM | POA: Diagnosis not present

## 2015-10-08 DIAGNOSIS — I700 Atherosclerosis of aorta: Secondary | ICD-10-CM | POA: Diagnosis not present

## 2015-10-08 DIAGNOSIS — E782 Mixed hyperlipidemia: Secondary | ICD-10-CM | POA: Diagnosis not present

## 2015-10-08 DIAGNOSIS — I340 Nonrheumatic mitral (valve) insufficiency: Secondary | ICD-10-CM | POA: Diagnosis not present

## 2015-10-08 DIAGNOSIS — I517 Cardiomegaly: Secondary | ICD-10-CM | POA: Diagnosis not present

## 2015-10-08 DIAGNOSIS — I5032 Chronic diastolic (congestive) heart failure: Secondary | ICD-10-CM | POA: Diagnosis not present

## 2015-10-08 DIAGNOSIS — N183 Chronic kidney disease, stage 3 (moderate): Secondary | ICD-10-CM | POA: Diagnosis not present

## 2015-10-08 DIAGNOSIS — I481 Persistent atrial fibrillation: Secondary | ICD-10-CM | POA: Diagnosis not present

## 2015-10-08 DIAGNOSIS — I10 Essential (primary) hypertension: Secondary | ICD-10-CM | POA: Diagnosis not present

## 2015-10-27 DIAGNOSIS — M79674 Pain in right toe(s): Secondary | ICD-10-CM | POA: Diagnosis not present

## 2015-10-27 DIAGNOSIS — M79675 Pain in left toe(s): Secondary | ICD-10-CM | POA: Diagnosis not present

## 2015-10-27 DIAGNOSIS — B351 Tinea unguium: Secondary | ICD-10-CM | POA: Diagnosis not present

## 2015-11-15 ENCOUNTER — Other Ambulatory Visit: Payer: Self-pay | Admitting: Family Medicine

## 2015-11-15 NOTE — Telephone Encounter (Signed)
Patient requesting refill. 

## 2015-12-09 ENCOUNTER — Other Ambulatory Visit: Payer: Self-pay | Admitting: Family Medicine

## 2015-12-09 DIAGNOSIS — I10 Essential (primary) hypertension: Secondary | ICD-10-CM

## 2015-12-09 DIAGNOSIS — I5032 Chronic diastolic (congestive) heart failure: Secondary | ICD-10-CM

## 2015-12-16 ENCOUNTER — Ambulatory Visit: Payer: Medicare Other | Admitting: Family Medicine

## 2016-01-07 ENCOUNTER — Encounter: Payer: Self-pay | Admitting: Family Medicine

## 2016-01-07 ENCOUNTER — Ambulatory Visit (INDEPENDENT_AMBULATORY_CARE_PROVIDER_SITE_OTHER): Payer: Medicare Other | Admitting: Family Medicine

## 2016-01-07 VITALS — BP 128/76 | HR 81 | Temp 97.9°F | Resp 18 | Ht 65.0 in | Wt 197.8 lb

## 2016-01-07 DIAGNOSIS — I5032 Chronic diastolic (congestive) heart failure: Secondary | ICD-10-CM | POA: Diagnosis not present

## 2016-01-07 DIAGNOSIS — I10 Essential (primary) hypertension: Secondary | ICD-10-CM | POA: Diagnosis not present

## 2016-01-07 DIAGNOSIS — Z794 Long term (current) use of insulin: Secondary | ICD-10-CM | POA: Diagnosis not present

## 2016-01-07 DIAGNOSIS — I700 Atherosclerosis of aorta: Secondary | ICD-10-CM

## 2016-01-07 DIAGNOSIS — I480 Paroxysmal atrial fibrillation: Secondary | ICD-10-CM | POA: Diagnosis not present

## 2016-01-07 DIAGNOSIS — E1122 Type 2 diabetes mellitus with diabetic chronic kidney disease: Secondary | ICD-10-CM

## 2016-01-07 DIAGNOSIS — K219 Gastro-esophageal reflux disease without esophagitis: Secondary | ICD-10-CM | POA: Diagnosis not present

## 2016-01-07 DIAGNOSIS — E1165 Type 2 diabetes mellitus with hyperglycemia: Secondary | ICD-10-CM | POA: Diagnosis not present

## 2016-01-07 DIAGNOSIS — N183 Chronic kidney disease, stage 3 (moderate): Secondary | ICD-10-CM

## 2016-01-07 DIAGNOSIS — E785 Hyperlipidemia, unspecified: Secondary | ICD-10-CM | POA: Diagnosis not present

## 2016-01-07 DIAGNOSIS — IMO0002 Reserved for concepts with insufficient information to code with codable children: Secondary | ICD-10-CM

## 2016-01-07 MED ORDER — PANTOPRAZOLE SODIUM 40 MG PO TBEC: 40.0000 mg | DELAYED_RELEASE_TABLET | Freq: Every day | ORAL | 5 refills | Status: DC

## 2016-01-07 MED ORDER — ROSUVASTATIN CALCIUM 40 MG PO TABS: 40.0000 mg | ORAL_TABLET | Freq: Every day | ORAL | 5 refills | Status: DC

## 2016-01-07 MED ORDER — METFORMIN HCL 500 MG PO TABS: 500.0000 mg | ORAL_TABLET | Freq: Two times a day (BID) | ORAL | 0 refills | Status: DC

## 2016-01-07 NOTE — Progress Notes (Signed)
Name: Justin Manning   MRN: [id number]    DOB: [birth year]   Date:01/07/2016       Progress Note  Subjective  Chief Complaint  Chief Complaint  Patient presents with  . Medication Refill    4 month F/U  . Diabetes    Going to [facility], aded one medication when his sugar rises for him to take   . Dyslipidemia    Doing well on Crestor  . Hypertension  . Gastroesophageal Reflux    Well controlled     HPI  DMII: he has been seeing Dr. Filbert Berthold, and could not find last hgbA1C on his [facility] Chart.  He is checking glucose at home at least twice daily, he is on Metformin but only once daily  ( Dr. Delton Coombes changed from ER to regular Metformin) and Lantus daily but not using pre-meal insulin. He did not bring his sugar log. He has CKI stage III, foot exam is up to date, and also eye exam.   Dyslipidemia: taking Crestor 40 mg daily and denies side effects, no myalgia, he is doing well now.   HTN: taking bp medication, denies side effects of medications. No chest pain or palpitation. Seeing Dr. Abigail Butts. He did not bring his medications, not sure if compliant with all of the medications but bp is at goal   Afib: denies any symptoms, no palpitation, no chest pain, he has SOB with mild to moderate  activity. Taking Pradaxa, denies bleeding   [place]und nodule: seen on CT chest last year, incidental finding, during CT abdomen, he denies cough, no increase in SOB, smaller in size when rechecked in 03/2015. He has quit smoking over 25 years ago and we will not recheck it any longer  Chronic CHF - Diastolic : uses 2 pills to sleep, no edema, denies PND, he has SOB with activity at this time, no wheezing. He sees Dr. Ubaldo Glassing every 6 months.   Atherosclerosis abdominal, carotid: he is on stating therapy, and Pradaxa,  and bp is at goal. Needs to get diabetes under control   Patient Active Problem List   Diagnosis Date Noted  . Hiatal hernia 04/11/2015  . Emphysema lung ([place]) 04/11/2015  .  Protein malnutrition ([place]) 03/18/2015  . Anemia in chronic illness 11/09/2014  . Abdominal aortic atherosclerosis ([place]) 11/09/2014  . A-fib ([place]) 11/09/2014  . Atrial hypertrophy 11/09/2014  . Carotid arterial disease ([place]) 11/09/2014  . Chronic kidney disease (CKD), stage III (moderate) 11/09/2014  . Diabetes mellitus with renal manifestations, uncontrolled ([place]) 11/09/2014  . Diastolic dysfunction with heart failure ([place]) 11/09/2014  . Dysfunction of eustachian tube 11/09/2014  . Dyslipidemia 11/09/2014  . Essential (primary) hypertension 11/09/2014  . Gastro-esophageal reflux disease without esophagitis 11/09/2014  . Hearing loss of left ear 11/09/2014  . H/O carotid endarterectomy 11/09/2014  . H/O malignant neoplasm of colon 11/09/2014  . H/O infectious disease 11/09/2014  . H/O malignant neoplasm of prostate 11/09/2014  . Lung nodule, solitary 11/09/2014  . Mild cognitive disorder 11/09/2014  . TI (tricuspid incompetence) 11/09/2014    Past Surgical History:  Procedure Laterality Date  . ENDARTERECTOMY Left    Carotid  . KNEE SURGERY Right   . PARTIAL COLECTOMY     with Anastomosis  . SHOULDER SURGERY Right     Family History  Problem Relation Age of Onset  . Alcohol abuse Brother   . Diabetes Daughter     Social History   Social History  . Marital status: Single  Spouse name: N/A  . Number of children: 3  . Years of education: 12th    Occupational History  . Not on file.   Social History Main Topics  . Smoking status: Former Smoker    Quit date: 05/15/1988  . Smokeless tobacco: Never Used  . Alcohol use No  . Drug use: No  . Sexual activity: Not Currently   Other Topics Concern  . Not on file   Social History Narrative  . No narrative on file     Current Outpatient Prescriptions:  .  Cyanocobalamin (VITAMIN B 12) 100 MCG LOZG, Take by mouth., Disp: , Rfl:  .  dabigatran (PRADAXA) 75 MG CAPS capsule, Take 2 tablets by mouth 2 (two) times  daily., Disp: , Rfl:  .  diltiazem (DILACOR XR) 240 MG 24 hr capsule, Take 1 capsule (240 mg total) by mouth daily., Disp: 90 capsule, Rfl: 1 .  glucose blood (FREESTYLE LITE) test strip, 1 each by Other route 3 (three) times daily. Use as instructed, Disp: 300 each, Rfl: 2 .  HUMALOG KWIKPEN 100 UNIT/ML KiwkPen, INJECT 4-6 UNITS UNDER THE SKIN THREE TIMES DAILY WITH MEALS, Disp: 45 mL, Rfl: 3 .  hydrALAZINE (APRESOLINE) 50 MG tablet, Take 1 tablet by mouth 4 (four) times daily., Disp: , Rfl:  .  hydrochlorothiazide (HYDRODIURIL) 25 MG tablet, TAKE 1 TABLET(25 MG) BY MOUTH DAILY, Disp: 90 tablet, Rfl: 0 .  INS SYRINGE/NEEDLE 1CC/28G 28G X 1/2" 1 ML MISC, Insulin injecting ICD-9 250.00 Inject daily 100 unit syringe (99ml), Disp: , Rfl:  .  Insulin Glargine (LANTUS) 100 UNIT/ML Solostar Pen, Inject 20 Units into the skin daily., Disp: 15 mL, Rfl: 2 .  Insulin Pen Needle (B-D ULTRAFINE III SHORT PEN) 31G X 8 MM MISC, U UTD, Disp: , Rfl:  .  lisinopril (PRINIVIL,ZESTRIL) 20 MG tablet, TAKE 1 TABLET BY MOUTH TWICE DAILY, Disp: 180 tablet, Rfl: 0 .  NEEDLE, DISP, 20 G (BD DISP NEEDLES) 20G X 1" MISC, 1 Units by Does not apply route daily., Disp: 50 each, Rfl: 5 .  pantoprazole (PROTONIX) 40 MG tablet, Take 1 tablet (40 mg total) by mouth daily., Disp: 30 tablet, Rfl: 5 .  rosuvastatin (CRESTOR) 40 MG tablet, Take 1 tablet (40 mg total) by mouth daily., Disp: 30 tablet, Rfl: 5 .  metFORMIN (GLUCOPHAGE) 500 MG tablet, Take 1 tablet (500 mg total) by mouth 2 (two) times daily with a meal., Disp: 60 tablet, Rfl: 0  Allergies  Allergen Reactions  . Aspirin     tachycardia     ROS  Ten systems reviewed and is negative except as mentioned in HPI   Objective  Vitals:   01/07/16 1442  BP: 128/76  Pulse: 81  Resp: 18  Temp: 97.9 F (36.6 C)  TempSrc: Oral  SpO2: 98%  Weight: 197 lb 12.8 oz (89.7 kg)  Height: 5\' 5"  (1.651 m)    Body mass index is 32.92 kg/m.  Physical  Exam  Constitutional: Patient appears well-developed  Obese  No distress.  HEENT: head atraumatic, normocephalic, pupils equal and reactive to light,  neck supple, throat within normal limits Cardiovascular: Irregular rate and rhythm, he has a holosystolic murmur  No BLE edema. Pulmonary/Chest: Effort normal and breath sounds normal. No respiratory distress. Abdominal: Soft.  There is no tenderness. Psychiatric: Patient has a normal mood and affect. behavior is normal. Judgment and thought content normal.  PHQ2/9: Depression screen [facility] 2/9 01/07/2016 08/16/2015 06/25/2015 05/18/2015 03/18/2015  Decreased Interest  0 0 0 0 0  Down, Depressed, Hopeless 0 0 0 0 0  PHQ - 2 Score 0 0 0 0 0     Fall Risk: Fall Risk  01/07/2016 08/16/2015 06/25/2015 05/18/2015 03/18/2015  Falls in the past year? No No No No No      Functional Status Survey: Is the patient deaf or have difficulty hearing?: No Does the patient have difficulty seeing, even when wearing glasses/contacts?: No Does the patient have difficulty concentrating, remembering, or making decisions?: No Does the patient have difficulty walking or climbing stairs?: No Does the patient have difficulty dressing or bathing?: No Does the patient have difficulty doing errands alone such as visiting a doctor's office or shopping?: No   Assessment & Plan  1. Uncontrolled type 2 diabetes mellitus with stage 3 chronic kidney disease, with long-term current use of insulin (HCC)  - metFORMIN (GLUCOPHAGE) 500 MG tablet; Take 1 tablet (500 mg total) by mouth 2 (two) times daily with a meal.  Dispense: 60 tablet; Refill: 0  2. Dyslipidemia  - rosuvastatin (CRESTOR) 40 MG tablet; Take 1 tablet (40 mg total) by mouth daily.  Dispense: 30 tablet; Refill: 5  3. Abdominal aortic atherosclerosis ([place])  Needs to get DM under control   4. Chronic diastolic heart failure (HCC)  Stable, continue medications and follow up with Dr. Ubaldo Glassing  5. Essential (primary)  hypertension  Well controlled now  56. Paroxysmal atrial fibrillation (HCC)  Asymptomatic and rate controlled  7. Gastroesophageal reflux disease without esophagitis  - pantoprazole (PROTONIX) 40 MG tablet; Take 1 tablet (40 mg total) by mouth daily.  Dispense: 30 tablet; Refill: 5

## 2016-01-14 ENCOUNTER — Other Ambulatory Visit: Payer: Self-pay | Admitting: Family Medicine

## 2016-01-14 DIAGNOSIS — E785 Hyperlipidemia, unspecified: Secondary | ICD-10-CM

## 2016-01-14 NOTE — Telephone Encounter (Signed)
Patient requesting refill of Crestor be sent [facility].

## 2016-02-04 DIAGNOSIS — M79675 Pain in left toe(s): Secondary | ICD-10-CM | POA: Diagnosis not present

## 2016-02-04 DIAGNOSIS — M79674 Pain in right toe(s): Secondary | ICD-10-CM | POA: Diagnosis not present

## 2016-02-04 DIAGNOSIS — B351 Tinea unguium: Secondary | ICD-10-CM | POA: Diagnosis not present

## 2016-02-07 DIAGNOSIS — N183 Chronic kidney disease, stage 3 (moderate): Secondary | ICD-10-CM | POA: Diagnosis not present

## 2016-02-07 DIAGNOSIS — R809 Proteinuria, unspecified: Secondary | ICD-10-CM | POA: Diagnosis not present

## 2016-02-07 DIAGNOSIS — E1122 Type 2 diabetes mellitus with diabetic chronic kidney disease: Secondary | ICD-10-CM | POA: Diagnosis not present

## 2016-02-07 DIAGNOSIS — I129 Hypertensive chronic kidney disease with stage 1 through stage 4 chronic kidney disease, or unspecified chronic kidney disease: Secondary | ICD-10-CM | POA: Diagnosis not present

## 2016-02-15 ENCOUNTER — Other Ambulatory Visit: Payer: Self-pay | Admitting: Family Medicine

## 2016-02-23 ENCOUNTER — Other Ambulatory Visit: Payer: Self-pay | Admitting: Family Medicine

## 2016-02-24 NOTE — Telephone Encounter (Signed)
Patient requesting refill of Ultrafine needles to [facility].

## 2016-03-17 ENCOUNTER — Ambulatory Visit (INDEPENDENT_AMBULATORY_CARE_PROVIDER_SITE_OTHER): Payer: Medicare Other

## 2016-03-17 ENCOUNTER — Other Ambulatory Visit: Payer: Self-pay | Admitting: Family Medicine

## 2016-03-17 DIAGNOSIS — Z23 Encounter for immunization: Secondary | ICD-10-CM

## 2016-03-17 DIAGNOSIS — I10 Essential (primary) hypertension: Secondary | ICD-10-CM

## 2016-03-17 DIAGNOSIS — I5032 Chronic diastolic (congestive) heart failure: Secondary | ICD-10-CM

## 2016-03-17 NOTE — Telephone Encounter (Signed)
Patient requesting [facility]ill of [facility]ril [facility].

## 2016-03-22 ENCOUNTER — Telehealth: Payer: Self-pay | Admitting: Family Medicine

## 2016-03-22 NOTE — Telephone Encounter (Signed)
Patient went to pick up his insulin prescription and states that he is in the gap. Would like to know if you have any samples of lantus or toujeo. Will need to pick it up today

## 2016-03-22 NOTE — Telephone Encounter (Signed)
No we do not have any samples if you could contact a rep and have some dropped off please

## 2016-03-22 NOTE — Telephone Encounter (Signed)
Tried contacting drug rep for samples and no luck. Will try again tomorrow.

## 2016-03-23 NOTE — Telephone Encounter (Signed)
Informed daughter that I tried to get samples but had no luck. She informed me that it was okay that she went ahead and picked up the prescription.

## 2016-04-14 DIAGNOSIS — I340 Nonrheumatic mitral (valve) insufficiency: Secondary | ICD-10-CM | POA: Diagnosis not present

## 2016-04-14 DIAGNOSIS — I4891 Unspecified atrial fibrillation: Secondary | ICD-10-CM | POA: Diagnosis not present

## 2016-04-14 DIAGNOSIS — E782 Mixed hyperlipidemia: Secondary | ICD-10-CM | POA: Diagnosis not present

## 2016-04-14 DIAGNOSIS — I700 Atherosclerosis of aorta: Secondary | ICD-10-CM | POA: Diagnosis not present

## 2016-04-14 DIAGNOSIS — I10 Essential (primary) hypertension: Secondary | ICD-10-CM | POA: Diagnosis not present

## 2016-04-18 ENCOUNTER — Other Ambulatory Visit: Payer: Self-pay | Admitting: Family Medicine

## 2016-04-18 DIAGNOSIS — Z794 Long term (current) use of insulin: Principal | ICD-10-CM

## 2016-04-18 DIAGNOSIS — IMO0002 Reserved for concepts with insufficient information to code with codable children: Secondary | ICD-10-CM

## 2016-04-18 DIAGNOSIS — E1122 Type 2 diabetes mellitus with diabetic chronic kidney disease: Secondary | ICD-10-CM

## 2016-04-18 DIAGNOSIS — N183 Chronic kidney disease, stage 3 (moderate): Principal | ICD-10-CM

## 2016-04-18 DIAGNOSIS — E1165 Type 2 diabetes mellitus with hyperglycemia: Principal | ICD-10-CM

## 2016-04-18 NOTE — Telephone Encounter (Signed)
Patient requesting refill of Lantus to [facility].

## 2016-04-29 ENCOUNTER — Other Ambulatory Visit: Payer: Self-pay | Admitting: Family Medicine

## 2016-04-30 ENCOUNTER — Other Ambulatory Visit: Payer: Self-pay | Admitting: Family Medicine

## 2016-05-02 DIAGNOSIS — E119 Type 2 diabetes mellitus without complications: Secondary | ICD-10-CM | POA: Diagnosis not present

## 2016-05-02 LAB — HM DIABETES EYE EXAM

## 2016-05-16 ENCOUNTER — Other Ambulatory Visit: Payer: Self-pay | Admitting: Family Medicine

## 2016-05-16 DIAGNOSIS — I10 Essential (primary) hypertension: Secondary | ICD-10-CM

## 2016-05-21 ENCOUNTER — Other Ambulatory Visit: Payer: Self-pay | Admitting: Family Medicine

## 2016-06-14 DIAGNOSIS — M79674 Pain in right toe(s): Secondary | ICD-10-CM | POA: Diagnosis not present

## 2016-06-14 DIAGNOSIS — B351 Tinea unguium: Secondary | ICD-10-CM | POA: Diagnosis not present

## 2016-06-14 DIAGNOSIS — M79675 Pain in left toe(s): Secondary | ICD-10-CM | POA: Diagnosis not present

## 2016-06-17 ENCOUNTER — Other Ambulatory Visit: Payer: Self-pay | Admitting: Family Medicine

## 2016-06-17 DIAGNOSIS — I10 Essential (primary) hypertension: Secondary | ICD-10-CM

## 2016-06-17 DIAGNOSIS — I5032 Chronic diastolic (congestive) heart failure: Secondary | ICD-10-CM

## 2016-06-19 NOTE — Telephone Encounter (Signed)
Patient requesting refill of Lisinopril to [facility].

## 2016-07-05 ENCOUNTER — Other Ambulatory Visit: Payer: Self-pay | Admitting: Family Medicine

## 2016-07-07 ENCOUNTER — Encounter: Payer: Self-pay | Admitting: Family Medicine

## 2016-07-07 ENCOUNTER — Ambulatory Visit: Payer: Medicare Other | Admitting: Family Medicine

## 2016-07-07 ENCOUNTER — Ambulatory Visit (INDEPENDENT_AMBULATORY_CARE_PROVIDER_SITE_OTHER): Payer: Medicare Other | Admitting: Family Medicine

## 2016-07-07 VITALS — BP 108/68 | HR 103 | Temp 97.6°F | Resp 16 | Ht 65.0 in | Wt 179.7 lb

## 2016-07-07 DIAGNOSIS — I481 Persistent atrial fibrillation: Secondary | ICD-10-CM | POA: Diagnosis not present

## 2016-07-07 DIAGNOSIS — E1165 Type 2 diabetes mellitus with hyperglycemia: Secondary | ICD-10-CM

## 2016-07-07 DIAGNOSIS — E46 Unspecified protein-calorie malnutrition: Secondary | ICD-10-CM

## 2016-07-07 DIAGNOSIS — Z794 Long term (current) use of insulin: Secondary | ICD-10-CM

## 2016-07-07 DIAGNOSIS — I5032 Chronic diastolic (congestive) heart failure: Secondary | ICD-10-CM | POA: Diagnosis not present

## 2016-07-07 DIAGNOSIS — IMO0002 Reserved for concepts with insufficient information to code with codable children: Secondary | ICD-10-CM

## 2016-07-07 DIAGNOSIS — E785 Hyperlipidemia, unspecified: Secondary | ICD-10-CM

## 2016-07-07 DIAGNOSIS — N183 Chronic kidney disease, stage 3 unspecified: Secondary | ICD-10-CM

## 2016-07-07 DIAGNOSIS — I4819 Other persistent atrial fibrillation: Secondary | ICD-10-CM

## 2016-07-07 DIAGNOSIS — I700 Atherosclerosis of aorta: Secondary | ICD-10-CM | POA: Diagnosis not present

## 2016-07-07 DIAGNOSIS — E1169 Type 2 diabetes mellitus with other specified complication: Secondary | ICD-10-CM

## 2016-07-07 DIAGNOSIS — I10 Essential (primary) hypertension: Secondary | ICD-10-CM

## 2016-07-07 DIAGNOSIS — E1122 Type 2 diabetes mellitus with diabetic chronic kidney disease: Secondary | ICD-10-CM | POA: Diagnosis not present

## 2016-07-07 LAB — TSH: TSH: 0.66 mIU/L (ref 0.40–4.50)

## 2016-07-07 LAB — CBC WITH DIFFERENTIAL/PLATELET
BASOS ABS: 0 {cells}/uL (ref 0–200)
Basophils Relative: 0 %
EOS PCT: 1 %
Eosinophils Absolute: 107 cells/uL (ref 15–500)
HCT: 40.5 % (ref 38.5–50.0)
HEMOGLOBIN: 13.4 g/dL (ref 13.2–17.1)
LYMPHS ABS: 1284 {cells}/uL (ref 850–3900)
Lymphocytes Relative: 12 %
MCH: 31.3 pg (ref 27.0–33.0)
MCHC: 33.1 g/dL (ref 32.0–36.0)
MCV: 94.6 fL (ref 80.0–100.0)
MONOS PCT: 8 %
MPV: 10.7 fL (ref 7.5–12.5)
Monocytes Absolute: 856 cells/uL (ref 200–950)
Neutro Abs: 8453 cells/uL — ABNORMAL HIGH (ref 1500–7800)
Neutrophils Relative %: 79 %
PLATELETS: 201 10*3/uL (ref 140–400)
RBC: 4.28 MIL/uL (ref 4.20–5.80)
RDW: 15 % (ref 11.0–15.0)
WBC: 10.7 10*3/uL (ref 3.8–10.8)

## 2016-07-07 LAB — COMPLETE METABOLIC PANEL WITH GFR
ALT: 9 U/L (ref 9–46)
AST: 15 U/L (ref 10–35)
Albumin: 3.9 g/dL (ref 3.6–5.1)
Alkaline Phosphatase: 55 U/L (ref 40–115)
BUN: 18 mg/dL (ref 7–25)
CO2: 24 mmol/L (ref 20–31)
Calcium: 9.5 mg/dL (ref 8.6–10.3)
Chloride: 105 mmol/L (ref 98–110)
Creat: 1.68 mg/dL — ABNORMAL HIGH (ref 0.70–1.18)
GFR, EST NON AFRICAN AMERICAN: 38 mL/min — AB (ref >=60)
GFR, Est African American: 44 mL/min — ABNORMAL LOW (ref >=60)
GLUCOSE: 89 mg/dL (ref 65–99)
POTASSIUM: 4.4 mmol/L (ref 3.5–5.3)
SODIUM: 141 mmol/L (ref 135–146)
TOTAL PROTEIN: 6.9 g/dL (ref 6.1–8.1)
Total Bilirubin: 0.7 mg/dL (ref 0.2–1.2)

## 2016-07-07 MED ORDER — ROSUVASTATIN CALCIUM 40 MG PO TABS: ORAL_TABLET | ORAL | 1 refills | Status: DC

## 2016-07-07 MED ORDER — HYDROCHLOROTHIAZIDE 25 MG PO TABS: ORAL_TABLET | ORAL | 1 refills | Status: DC

## 2016-07-07 MED ORDER — INSULIN DEGLUDEC-LIRAGLUTIDE 100-3.6 UNIT-MG/ML ~~LOC~~ SOPN: 16.0000 [IU] | PEN_INJECTOR | Freq: Every day | SUBCUTANEOUS | 0 refills | Status: DC

## 2016-07-07 NOTE — Progress Notes (Signed)
Name: Justin Manning   MRN: [id number]    DOB: 1935/12/04   Date:07/07/2016       Progress Note  Subjective  Chief Complaint  Chief Complaint  Patient presents with  . Medication Refill    6 month F/U  . Hypertension    SOB about the same, has lose 19 pounds since last visit.  . Diabetes    Still seeing [facility] Endocrinologist Dr. Filbert Berthold  . Gastroesophageal Reflux    well controlled with medication  . Atrial Fibrillation    HPI   DMII: he was seeing Dr. Filbert Berthold, but lost to follow up, currently only on Lantus, he has been off Metformin and pre-meal insulin. hgbA1C is above 8 today, discussed options and we will try Xutophy  He has CKI stage III, foot exam is up to date, and also eye exam. - we will get [facility]. Son and daughter Steele Berg ) states no family history of thyroid cancer  Dyslipidemia: taking Crestor 40 mg daily and denies side effects, no myalgia  HTN: taking bp medication, denies side effects of medications. No chest pain or palpitation. Seeing Dr. Abigail Butts. He did not bring his medications, daughter fill his medications on daily dosage and monitors at the end of the day if he has taken the medication.  Afib: denies any symptoms, no palpitation, no chest pain, he has SOB with mild to moderate  activity. Taking Pradaxa, denies bleeding , he sees Dr. Eustace Quail nodule: seen on CT chest last year, incidental finding, during CT abdomen, he denies cough, no increase in SOB, smaller in size when rechecked in 03/2015. He has quit smoking over 25 years ago and we will not recheck it any longer  Chronic CHF - Diastolic : uses 2 pills to sleep, no edema, denies PND, he has SOB with activity at this time, no wheezing. He sees Dr. Ubaldo Glassing every 6 months. Last visit 04/2016  Malnutrition: he has been losing more weight, 19 lbs since last visit, daughter thinks he is forgetting to eat, and could not do pre-meal insulin, he denies fatigue. He does not have meals on  wheels. He states he is eating daily, he is not sure why he lost weight. Son thinks it is because he is not eating sweets as much and using sugar substitute.   GERD: under control at this time, taking Pantoprazole  Atherosclerosis abdominal, carotid: he is on stating therapy, and Pradaxa,  and bp is at goal. Needs to get diabetes under control  Patient Active Problem List   Diagnosis Date Noted  . Hiatal hernia 04/11/2015  . Emphysema lung ([place]) 04/11/2015  . Protein malnutrition ([place]) 03/18/2015  . Anemia in chronic illness 11/09/2014  . Abdominal aortic atherosclerosis ([place]) 11/09/2014  . A-fib ([place]) 11/09/2014  . Atrial hypertrophy 11/09/2014  . Carotid arterial disease ([place]) 11/09/2014  . Chronic kidney disease (CKD), stage III (moderate) 11/09/2014  . Diabetes mellitus with renal manifestations, uncontrolled ([place]) 11/09/2014  . Diastolic dysfunction with heart failure ([place]) 11/09/2014  . Dysfunction of eustachian tube 11/09/2014  . Dyslipidemia associated with type 2 diabetes mellitus ([place]) 11/09/2014  . Essential (primary) hypertension 11/09/2014  . Gastro-esophageal reflux disease without esophagitis 11/09/2014  . Hearing loss of left ear 11/09/2014  . H/O carotid endarterectomy 11/09/2014  . H/O malignant neoplasm of colon 11/09/2014  . H/O infectious disease 11/09/2014  . H/O malignant neoplasm of prostate 11/09/2014  . Lung nodule, solitary 11/09/2014  . Mild cognitive disorder  11/09/2014  . TI (tricuspid incompetence) 11/09/2014    Past Surgical History:  Procedure Laterality Date  . ENDARTERECTOMY Left    Carotid  . KNEE SURGERY Right   . PARTIAL COLECTOMY     with Anastomosis  . SHOULDER SURGERY Right     Family History  Problem Relation Age of Onset  . Alcohol abuse Brother   . Diabetes Daughter     Social History   Social History  . Marital status: Single    Spouse name: N/A  . Number of children: 3  . Years of education: 12th    Occupational  History  . retired    Social History Main Topics  . Smoking status: Former Smoker    Quit date: 05/15/1988  . Smokeless tobacco: Never Used  . Alcohol use No  . Drug use: No  . Sexual activity: Not Currently   Other Topics Concern  . Not on file   Social History Narrative   He lives alone, but his youngest son moved in with him in 2017   He states that his son does not want to work, and is stressing him out.      Current Outpatient Prescriptions:  .  B-D ULTRAFINE III SHORT PEN 31G X 8 MM MISC, USE AS DIRECTED, Disp: 100 each, Rfl: 0 .  Cyanocobalamin (VITAMIN B 12) 100 MCG LOZG, Take by mouth., Disp: , Rfl:  .  dabigatran (PRADAXA) 75 MG CAPS capsule, Take 2 tablets by mouth 2 (two) times daily., Disp: , Rfl:  .  diltiazem (DILACOR XR) 240 MG 24 hr capsule, Take 1 capsule (240 mg total) by mouth daily., Disp: 90 capsule, Rfl: 1 .  FREESTYLE LITE test strip, TEST BLOOD SUGAR THREE TIMES DAILY AS DIRECTED, Disp: 300 each, Rfl: 2 .  hydrALAZINE (APRESOLINE) 50 MG tablet, Take 1 tablet by mouth 4 (four) times daily., Disp: , Rfl:  .  hydrochlorothiazide (HYDRODIURIL) 25 MG tablet, TAKE 1 TABLET(25 MG) BY MOUTH DAILY, Disp: 90 tablet, Rfl: 1 .  INS SYRINGE/NEEDLE 1CC/28G 28G X 1/2" 1 ML MISC, Insulin injecting ICD-9 250.00 Inject daily 100 unit syringe (66ml), Disp: , Rfl:  .  lisinopril (PRINIVIL,ZESTRIL) 20 MG tablet, TAKE 1 TABLET BY MOUTH TWICE DAILY, Disp: 180 tablet, Rfl: 0 .  NEEDLE, DISP, 20 G (BD DISP NEEDLES) 20G X 1" MISC, 1 Units by Does not apply route daily., Disp: 50 each, Rfl: 5 .  pantoprazole (PROTONIX) 40 MG tablet, TAKE 1 TABLET BY MOUTH TWICE DAILY, Disp: 60 tablet, Rfl: 0 .  rosuvastatin (CRESTOR) 40 MG tablet, TAKE 1 TABLET(40 MG) BY MOUTH DAILY, Disp: 90 tablet, Rfl: 1 .  Insulin Degludec-Liraglutide (XULTOPHY) 100-3.6 UNIT-MG/ML SOPN, Inject 16-50 Units into the skin daily., Disp: 9 mL, Rfl: 0  Allergies  Allergen Reactions  . Aspirin     tachycardia      ROS  Constitutional: Negative for fever, positive for weight change - lost 19 lbs since last visit.  Respiratory: Negative for cough and shortness of breath.   Cardiovascular: Negative for chest pain or palpitations.  Gastrointestinal: Negative for abdominal pain, no bowel changes.  Musculoskeletal: Positive  for gait problem no joint swelling.  Skin: Negative for rash.  Neurological: Negative for dizziness or headache.  No other specific complaints in a complete review of systems (except as listed in HPI above).  Objective  Vitals:   07/07/16 1428  BP: 108/68  Pulse: (!) 103  Resp: 16  Temp: 97.6 F (36.4 C)  TempSrc: Oral  SpO2: 97%  Weight: 179 lb 11.2 oz (81.5 kg)  Height: 5\' 5"  (1.651 m)    Body mass index is 29.9 kg/m.  Physical Exam  Constitutional: Patient appears well-developed and well-nourished. Overweight. No distress.  HEENT: head atraumatic, normocephalic, pupils equal and reactive to light,  neck supple, throat within normal limits Cardiovascular: Normal rate, irregular rhythm and normal heart sounds.  No murmur heard. No BLE edema. Pulmonary/Chest: Effort normal and breath sounds normal. No respiratory distress. Abdominal: Soft.  There is no tenderness. Psychiatric: Patient has a normal mood and affect. behavior is normal. Judgment and thought content normal. Muscular Skeletal: he uses a cane  PHQ2/9: Depression screen [facility] 2/9 07/07/2016 01/07/2016 08/16/2015 06/25/2015 05/18/2015  Decreased Interest 0 0 0 0 0  Down, Depressed, Hopeless 0 0 0 0 0  PHQ - 2 Score 0 0 0 0 0     Fall Risk: Fall Risk  07/07/2016 01/07/2016 08/16/2015 06/25/2015 05/18/2015  Falls in the past year? No No No No No     Functional Status Survey: Is the patient deaf or have difficulty hearing?: No Does the patient have difficulty seeing, even when wearing glasses/contacts?: No Does the patient have difficulty concentrating, remembering, or making decisions?: Yes (Every once in  awhile) Does the patient have difficulty walking or climbing stairs?: Yes (walks with a cane) Does the patient have difficulty dressing or bathing?: No Does the patient have difficulty doing errands alone such as visiting a doctor's office or shopping?: Yes (Does not drive)   Assessment & Plan  1. Uncontrolled type 2 diabetes mellitus with stage 3 chronic kidney disease, with long-term current use of insulin (HCC)  - Urine Microalbumin w/creat. ratio  2. Essential (primary) hypertension  - hydrochlorothiazide (HYDRODIURIL) 25 MG tablet; TAKE 1 TABLET(25 MG) BY MOUTH DAILY  Dispense: 90 tablet; Refill: 1 - CBC with Differential/Platelet - COMPLETE METABOLIC PANEL WITH GFR  3. Dyslipidemia associated with type 2 diabetes mellitus ([place])  On Crestor, he will have labs done when he goes to Endo next week, not fasting right now  4. Protein malnutrition (HCC)  - TSH - Vitamin B12 - CBC with Differential/Platelet  5. Abdominal aortic atherosclerosis ([place])  On pradaxa and high dose statin   6. Chronic diastolic heart failure (HCC)  Sees Dr. Ubaldo Glassing   7. Persistent atrial fibrillation (HCC)  Stable, no palpitation   8. Dyslipidemia  - rosuvastatin (CRESTOR) 40 MG tablet; TAKE 1 TABLET(40 MG) BY MOUTH DAILY  Dispense: 90 tablet; Refill: 1  9. Chronic kidney disease, stage 3  - VITAMIN D 25 Hydroxy (Vit-D Deficiency, Fractures) - Parathyroid hormone, intact (no Ca) - Urine Microalbumin w/creat. ratio

## 2016-07-08 LAB — VITAMIN D 25 HYDROXY (VIT D DEFICIENCY, FRACTURES): VIT D 25 HYDROXY: 44 ng/mL (ref 30–100)

## 2016-07-08 LAB — MICROALBUMIN / CREATININE URINE RATIO
Creatinine, Urine: 208 mg/dL (ref 20–370)
MICROALB/CREAT RATIO: 101 ug/mg{creat} — AB (ref <30)
Microalb, Ur: 21.1 mg/dL

## 2016-07-08 LAB — VITAMIN B12: VITAMIN B 12: 831 pg/mL (ref 200–1100)

## 2016-07-10 LAB — PARATHYROID HORMONE, INTACT (NO CA): PTH: 47 pg/mL (ref 14–64)

## 2016-07-11 DIAGNOSIS — N183 Chronic kidney disease, stage 3 (moderate): Secondary | ICD-10-CM | POA: Diagnosis not present

## 2016-07-11 DIAGNOSIS — Z79899 Other long term (current) drug therapy: Secondary | ICD-10-CM | POA: Diagnosis not present

## 2016-07-11 DIAGNOSIS — Z794 Long term (current) use of insulin: Secondary | ICD-10-CM | POA: Diagnosis not present

## 2016-07-11 DIAGNOSIS — E1122 Type 2 diabetes mellitus with diabetic chronic kidney disease: Secondary | ICD-10-CM | POA: Diagnosis not present

## 2016-08-09 DIAGNOSIS — N183 Chronic kidney disease, stage 3 (moderate): Secondary | ICD-10-CM | POA: Diagnosis not present

## 2016-08-09 DIAGNOSIS — E1122 Type 2 diabetes mellitus with diabetic chronic kidney disease: Secondary | ICD-10-CM | POA: Diagnosis not present

## 2016-08-09 DIAGNOSIS — I129 Hypertensive chronic kidney disease with stage 1 through stage 4 chronic kidney disease, or unspecified chronic kidney disease: Secondary | ICD-10-CM | POA: Diagnosis not present

## 2016-08-09 DIAGNOSIS — R809 Proteinuria, unspecified: Secondary | ICD-10-CM | POA: Diagnosis not present

## 2016-08-12 ENCOUNTER — Other Ambulatory Visit: Payer: Self-pay | Admitting: Family Medicine

## 2016-08-12 DIAGNOSIS — E1122 Type 2 diabetes mellitus with diabetic chronic kidney disease: Secondary | ICD-10-CM

## 2016-08-12 DIAGNOSIS — N183 Chronic kidney disease, stage 3 (moderate): Principal | ICD-10-CM

## 2016-08-12 DIAGNOSIS — IMO0002 Reserved for concepts with insufficient information to code with codable children: Secondary | ICD-10-CM

## 2016-08-12 DIAGNOSIS — Z794 Long term (current) use of insulin: Principal | ICD-10-CM

## 2016-08-12 DIAGNOSIS — E1165 Type 2 diabetes mellitus with hyperglycemia: Principal | ICD-10-CM

## 2016-08-15 ENCOUNTER — Other Ambulatory Visit: Payer: Self-pay | Admitting: Family Medicine

## 2016-08-15 DIAGNOSIS — I10 Essential (primary) hypertension: Secondary | ICD-10-CM

## 2016-08-19 ENCOUNTER — Other Ambulatory Visit: Payer: Self-pay | Admitting: Family Medicine

## 2016-08-21 NOTE — Telephone Encounter (Signed)
Patient requesting refill on Needles to [facility].

## 2016-09-28 ENCOUNTER — Other Ambulatory Visit: Payer: Self-pay | Admitting: Family Medicine

## 2016-09-28 DIAGNOSIS — I10 Essential (primary) hypertension: Secondary | ICD-10-CM

## 2016-09-28 DIAGNOSIS — I5032 Chronic diastolic (congestive) heart failure: Secondary | ICD-10-CM

## 2016-09-29 NOTE — Telephone Encounter (Signed)
Patient requesting refill of Lisinopril to [facility].

## 2016-10-16 ENCOUNTER — Other Ambulatory Visit: Payer: Self-pay | Admitting: Family Medicine

## 2016-10-16 ENCOUNTER — Encounter: Payer: Self-pay | Admitting: Family Medicine

## 2016-10-16 DIAGNOSIS — E1122 Type 2 diabetes mellitus with diabetic chronic kidney disease: Secondary | ICD-10-CM

## 2016-10-16 DIAGNOSIS — IMO0002 Reserved for concepts with insufficient information to code with codable children: Secondary | ICD-10-CM

## 2016-10-16 DIAGNOSIS — Z794 Long term (current) use of insulin: Principal | ICD-10-CM

## 2016-10-16 DIAGNOSIS — N183 Chronic kidney disease, stage 3 (moderate): Principal | ICD-10-CM

## 2016-10-16 DIAGNOSIS — E1165 Type 2 diabetes mellitus with hyperglycemia: Principal | ICD-10-CM

## 2016-10-27 DIAGNOSIS — I4891 Unspecified atrial fibrillation: Secondary | ICD-10-CM | POA: Diagnosis not present

## 2016-10-27 DIAGNOSIS — N183 Chronic kidney disease, stage 3 (moderate): Secondary | ICD-10-CM | POA: Diagnosis not present

## 2016-10-27 DIAGNOSIS — I5032 Chronic diastolic (congestive) heart failure: Secondary | ICD-10-CM | POA: Diagnosis not present

## 2016-10-27 DIAGNOSIS — I739 Peripheral vascular disease, unspecified: Secondary | ICD-10-CM | POA: Diagnosis not present

## 2016-10-27 DIAGNOSIS — I10 Essential (primary) hypertension: Secondary | ICD-10-CM | POA: Diagnosis not present

## 2016-10-27 DIAGNOSIS — I700 Atherosclerosis of aorta: Secondary | ICD-10-CM | POA: Diagnosis not present

## 2016-11-10 DIAGNOSIS — M79675 Pain in left toe(s): Secondary | ICD-10-CM | POA: Diagnosis not present

## 2016-11-10 DIAGNOSIS — B351 Tinea unguium: Secondary | ICD-10-CM | POA: Diagnosis not present

## 2016-11-10 DIAGNOSIS — M79674 Pain in right toe(s): Secondary | ICD-10-CM | POA: Diagnosis not present

## 2016-11-27 ENCOUNTER — Other Ambulatory Visit: Payer: Self-pay | Admitting: Family Medicine

## 2016-11-28 ENCOUNTER — Telehealth: Payer: Self-pay | Admitting: Family Medicine

## 2016-11-28 NOTE — Telephone Encounter (Signed)
PT SAID THAT HER FATHER IS NEEDING NEEDLES REFILLED FOR THE LANTUS PENS AND SHE HAS TRIED TO CALL DR ABBEYS OFFICE AND NO ONE IS GETTING BACK TO HER THEY KEEP PUTTING HER ON HOLD AND PLUS THE DR HAS LEFT THE PRACTICE. SHE WILL TRY AND KEEP CALLING THEM TO SET HIM UP WITH ANOTHER DR TO GET HIS DIAB SUPPLIES BUT AT THIS TIME HE CAN NOT GET THRU. HE JUST NEEDS THE NEEDLES FOR THE LANTUS PENS.[place].

## 2016-12-31 ENCOUNTER — Other Ambulatory Visit: Payer: Self-pay | Admitting: Family Medicine

## 2016-12-31 DIAGNOSIS — I10 Essential (primary) hypertension: Secondary | ICD-10-CM

## 2016-12-31 DIAGNOSIS — I5032 Chronic diastolic (congestive) heart failure: Secondary | ICD-10-CM

## 2017-01-01 NOTE — Telephone Encounter (Signed)
Needs follow up, last seen 06/2016

## 2017-01-01 NOTE — Telephone Encounter (Signed)
LFT MESSAGE TO GET AN APPT FOR REILL ON MEDICATION

## 2017-01-02 ENCOUNTER — Other Ambulatory Visit: Payer: Self-pay | Admitting: Family Medicine

## 2017-01-02 ENCOUNTER — Telehealth: Payer: Self-pay | Admitting: Family Medicine

## 2017-01-02 DIAGNOSIS — I5032 Chronic diastolic (congestive) heart failure: Secondary | ICD-10-CM

## 2017-01-02 DIAGNOSIS — I10 Essential (primary) hypertension: Secondary | ICD-10-CM

## 2017-01-02 MED ORDER — LISINOPRIL 20 MG PO TABS: 20.0000 mg | ORAL_TABLET | Freq: Two times a day (BID) | ORAL | 0 refills | Status: DC

## 2017-01-02 NOTE — Telephone Encounter (Signed)
The pharmacy had requested a refill on lisinopril and it was rejected. However, pt already had his 6 month follow appt scheduled for this Friday 01/05/17, however, he will be completely out of his medication by Wednesday. Asking that you please give him enough to last until appt. [email or web address]

## 2017-01-05 ENCOUNTER — Ambulatory Visit (INDEPENDENT_AMBULATORY_CARE_PROVIDER_SITE_OTHER): Payer: Medicare Other | Admitting: Family Medicine

## 2017-01-05 ENCOUNTER — Encounter: Payer: Self-pay | Admitting: Family Medicine

## 2017-01-05 VITALS — BP 116/74 | HR 84 | Temp 98.4°F | Resp 16 | Ht 65.0 in | Wt 175.2 lb

## 2017-01-05 DIAGNOSIS — I700 Atherosclerosis of aorta: Secondary | ICD-10-CM

## 2017-01-05 DIAGNOSIS — K219 Gastro-esophageal reflux disease without esophagitis: Secondary | ICD-10-CM

## 2017-01-05 DIAGNOSIS — Z794 Long term (current) use of insulin: Secondary | ICD-10-CM | POA: Diagnosis not present

## 2017-01-05 DIAGNOSIS — I10 Essential (primary) hypertension: Secondary | ICD-10-CM

## 2017-01-05 DIAGNOSIS — E1122 Type 2 diabetes mellitus with diabetic chronic kidney disease: Secondary | ICD-10-CM

## 2017-01-05 DIAGNOSIS — N183 Chronic kidney disease, stage 3 unspecified: Secondary | ICD-10-CM

## 2017-01-05 DIAGNOSIS — I5032 Chronic diastolic (congestive) heart failure: Secondary | ICD-10-CM

## 2017-01-05 DIAGNOSIS — E785 Hyperlipidemia, unspecified: Secondary | ICD-10-CM | POA: Diagnosis not present

## 2017-01-05 DIAGNOSIS — F1021 Alcohol dependence, in remission: Secondary | ICD-10-CM | POA: Insufficient documentation

## 2017-01-05 DIAGNOSIS — E1169 Type 2 diabetes mellitus with other specified complication: Secondary | ICD-10-CM | POA: Diagnosis not present

## 2017-01-05 DIAGNOSIS — I480 Paroxysmal atrial fibrillation: Secondary | ICD-10-CM | POA: Diagnosis not present

## 2017-01-05 LAB — POCT GLYCOSYLATED HEMOGLOBIN (HGB A1C): HEMOGLOBIN A1C: 6.7

## 2017-01-05 MED ORDER — ROSUVASTATIN CALCIUM 40 MG PO TABS: ORAL_TABLET | ORAL | 1 refills | Status: DC

## 2017-01-05 MED ORDER — LISINOPRIL 20 MG PO TABS: 20.0000 mg | ORAL_TABLET | Freq: Two times a day (BID) | ORAL | 0 refills | Status: DC

## 2017-01-05 MED ORDER — HYDROCHLOROTHIAZIDE 25 MG PO TABS: ORAL_TABLET | ORAL | 1 refills | Status: DC

## 2017-01-05 NOTE — Progress Notes (Signed)
Name: Justin Manning   MRN: [id number]    DOB: [birth year]   Date:01/05/2017       Progress Note  Subjective  Chief Complaint  Chief Complaint  Patient presents with  . Medication Refill  . Hypertension    Denies any symptoms  . Gastroesophageal Reflux    Takes medication daily and controls symptoms  . Diabetes    Still seeing Endocrinology at [facility]   . Hyperlipidemia    HPI   DMII: he was seeing Dr. Filbert Berthold, but lost to follow up, currently only on Lantus 20 units daily, he has been off Metformin and pre-meal insulin. hgbA1C is down to goal 6.7%He has CKI stage III, foot exam done today, eye exam also up to date. - we will get records from [facility]. Son and daughter Steele Berg ).  Dyslipidemia: taking Crestor 40 mg daily and denies side effects, no myalgia. He is due for labs  HTN: taking bp medication, denies side effects of medications. No chest pain or palpitation. Seeing Dr. Abigail Butts.He has not been forgetting to take medication lately   Afib: denies any symptoms, no palpitation, no chest pain, he has SOB with mild activity. Taking Pradaxa, denies bleeding , he sees Dr. Eustace Quail nodule: seen on CT chest last year, incidental finding, during CT abdomen, he denies cough, no increase in SOB, smaller in size when rechecked in 03/2015. He has quit smoking over 25 years ago and we will not recheck it any longer, patient agrees.   Chronic CHF - Diastolic : uses 2 pills to sleep, no edema, denies PND, he has SOB with mild activity,  no wheezing. He sees Dr. Ubaldo Glassing every 6 months, last visit was in June.   Malnutrition: he has been losing more weight, 19 lbs prior to  last visit, and 5 more lbs in the past 6 months.  He is not . He states he is eating daily, he is not sure why he lost weight. Initially son stopped buying sweets and has bene using sugar substitute. He eats small portions. Advised to take protein shakes  GERD: under control at this time, taking Pantoprazole once  daily and does not want to stop, he is aware of long term risk of medication  Atherosclerosis abdominal, carotid: he is on stating therapy, and Pradaxa. BP is at goal   Patient Active Problem List   Diagnosis Date Noted  . History of alcoholism ([place]) 01/05/2017  . Hiatal hernia 04/11/2015  . Emphysema lung ([place]) 04/11/2015  . Protein malnutrition ([place]) 03/18/2015  . Anemia in chronic illness 11/09/2014  . Abdominal aortic atherosclerosis ([place]) 11/09/2014  . A-fib ([place]) 11/09/2014  . Atrial hypertrophy 11/09/2014  . Carotid arterial disease ([place]) 11/09/2014  . Chronic kidney disease (CKD), stage III (moderate) 11/09/2014  . Diabetes mellitus with renal manifestations, uncontrolled ([place]) 11/09/2014  . Diastolic dysfunction with heart failure ([place]) 11/09/2014  . Dysfunction of eustachian tube 11/09/2014  . Dyslipidemia associated with type 2 diabetes mellitus ([place]) 11/09/2014  . Essential (primary) hypertension 11/09/2014  . Gastro-esophageal reflux disease without esophagitis 11/09/2014  . Hearing loss of left ear 11/09/2014  . H/O carotid endarterectomy 11/09/2014  . H/O malignant neoplasm of colon 11/09/2014  . H/O infectious disease 11/09/2014  . H/O malignant neoplasm of prostate 11/09/2014  . Lung nodule, solitary 11/09/2014  . Mild cognitive disorder 11/09/2014  . TI (tricuspid incompetence) 11/09/2014    Past Surgical History:  Procedure Laterality Date  . ENDARTERECTOMY Left  Carotid  . KNEE SURGERY Right   . PARTIAL COLECTOMY     with Anastomosis  . SHOULDER SURGERY Right     Family History  Problem Relation Age of Onset  . Alcohol abuse Brother   . Diabetes Daughter     Social History   Social History  . Marital status: Single    Spouse name: N/A  . Number of children: 3  . Years of education: 12th    Occupational History  . retired    Social History Main Topics  . Smoking status: Former Smoker    Quit date: 05/15/1988  . Smokeless tobacco: Never  Used  . Alcohol use No     Comment: used to be a heavy drinker but quit 40 years ago   . Drug use: Yes    Types: Marijuana     Comment: many years ago   . Sexual activity: Not Currently   Other Topics Concern  . Not on file   Social History Narrative   He lives alone, but his youngest son moved in with him in 2017   He states that his son does not want to work, and is stressing him out.      Current Outpatient Prescriptions:  .  B-D ULTRAFINE III SHORT PEN 31G X 8 MM MISC, USE AS DIRECTED, Disp: 100 each, Rfl: 0 .  Cyanocobalamin (VITAMIN B 12) 100 MCG LOZG, Take 100 mcg by mouth daily. , Disp: , Rfl:  .  dabigatran (PRADAXA) 75 MG CAPS capsule, Take 2 tablets by mouth 2 (two) times daily., Disp: , Rfl:  .  diltiazem (CARTIA XT) 240 MG 24 hr capsule, Take 1 capsule by mouth daily., Disp: , Rfl:  .  FREESTYLE LITE test strip, TEST BLOOD SUGAR THREE TIMES DAILY AS DIRECTED, Disp: 300 each, Rfl: 2 .  hydrALAZINE (APRESOLINE) 50 MG tablet, Take 1 tablet by mouth 4 (four) times daily., Disp: , Rfl:  .  hydrochlorothiazide (HYDRODIURIL) 25 MG tablet, TAKE 1 TABLET(25 MG) BY MOUTH DAILY, Disp: 90 tablet, Rfl: 1 .  INS SYRINGE/NEEDLE 1CC/28G 28G X 1/2" 1 ML MISC, Insulin injecting ICD-9 250.00 Inject daily 100 unit syringe (46ml), Disp: , Rfl:  .  LANTUS SOLOSTAR 100 UNIT/ML Solostar Pen, INJECT 20 UNITS UNDER THE SKIN EVERY DAY, Disp: 15 mL, Rfl: 0 .  lisinopril (PRINIVIL,ZESTRIL) 20 MG tablet, Take 1 tablet (20 mg total) by mouth 2 (two) times daily., Disp: 180 tablet, Rfl: 0 .  NEEDLE, DISP, 20 G (BD DISP NEEDLES) 20G X 1" MISC, 1 Units by Does not apply route daily., Disp: 50 each, Rfl: 5 .  pantoprazole (PROTONIX) 40 MG tablet, TAKE 1 TABLET BY MOUTH TWICE DAILY, Disp: 60 tablet, Rfl: 0 .  rosuvastatin (CRESTOR) 40 MG tablet, TAKE 1 TABLET(40 MG) BY MOUTH DAILY, Disp: 90 tablet, Rfl: 1  Allergies  Allergen Reactions  . Aspirin     tachycardia     ROS  Constitutional: Negative for  fever , positive for mild  weight change.  Respiratory: Negative for cough, chronic  shortness of breath.   Cardiovascular: Negative for chest pain or palpitations.  Gastrointestinal: Negative for abdominal pain, no bowel changes.  Musculoskeletal: Negative for gait problem or joint swelling.  Skin: Negative for rash.  Neurological: Negative for dizziness or headache.  No other specific complaints in a complete review of systems (except as listed in HPI above).  Objective  Vitals:   01/05/17 1501  BP: 116/74  Pulse: 84  Resp:  16  Temp: 98.4 F (36.9 C)  TempSrc: Oral  SpO2: 98%  Weight: 175 lb 3.2 oz (79.5 kg)  Height: 5\' 5"  (1.651 m)    Body mass index is 29.15 kg/m.  Physical Exam  Constitutional: Patient appears well-developed and well-nourished. Overweight. No distress.  HEENT: head atraumatic, normocephalic, pupils equal and reactive to light, neck supple, throat within normal limits Cardiovascular: Normal rate, irregular rhythm and normal heart sounds.  No murmur heard. No BLE edema. Pulmonary/Chest: Effort normal and breath sounds normal. No respiratory distress. Abdominal: Soft.  There is no tenderness. Psychiatric: Patient has a normal mood and affect. behavior is normal. Judgment and thought content normal.  No results found for this or any previous visit (from the past 2160 hour(s)).  Diabetic Foot Exam: Diabetic Foot Exam - Simple   Simple Foot Form Diabetic Foot exam was performed with the following findings:  Yes 01/05/2017  3:45 PM  Visual Inspection See comments:  Yes Sensation Testing Intact to touch and monofilament testing bilaterally:  Yes Pulse Check Posterior Tibialis and Dorsalis pulse intact bilaterally:  Yes Comments Hammer toes, dry skin, thick toenails.       PHQ2/9: Depression screen [facility] 2/9 01/05/2017 07/07/2016 01/07/2016 08/16/2015 06/25/2015  Decreased Interest 0 0 0 0 0  Down, Depressed, Hopeless 0 0 0 0 0  PHQ - 2 Score 0 0 0 0 0      Fall Risk: Fall Risk  01/05/2017 07/07/2016 01/07/2016 08/16/2015 06/25/2015  Falls in the past year? No No No No No    Functional Status Survey: Is the patient deaf or have difficulty hearing?: No Does the patient have difficulty seeing, even when wearing glasses/contacts?: No Does the patient have difficulty concentrating, remembering, or making decisions?: No Does the patient have difficulty walking or climbing stairs?: Yes Does the patient have difficulty dressing or bathing?: No Does the patient have difficulty doing errands alone such as visiting a doctor's office or shopping?: Yes    Assessment & Plan  1. Controlled type 2 diabetes mellitus with stage 3 chronic kidney disease, with long-term current use of insulin (HCC)  - POCT glycosylated hemoglobin (Hb A1C)  2. Dyslipidemia  - rosuvastatin (CRESTOR) 40 MG tablet; TAKE 1 TABLET(40 MG) BY MOUTH DAILY  Dispense: 90 tablet; Refill: 1 - Lipid panel  3. Essential (primary) hypertension  - lisinopril (PRINIVIL,ZESTRIL) 20 MG tablet; Take 1 tablet (20 mg total) by mouth 2 (two) times daily.  Dispense: 180 tablet; Refill: 0 - hydrochlorothiazide (HYDRODIURIL) 25 MG tablet; TAKE 1 TABLET(25 MG) BY MOUTH DAILY  Dispense: 90 tablet; Refill: 1 - COMPLETE METABOLIC PANEL WITH GFR - CBC with Differential/Platelet  4. Chronic diastolic heart failure (HCC)  - lisinopril (PRINIVIL,ZESTRIL) 20 MG tablet; Take 1 tablet (20 mg total) by mouth 2 (two) times daily.  Dispense: 180 tablet; Refill: 0  5. History of alcoholism ([place])  He states he used to be a heavy drinker, but quit years ago, no longer craves alcohol   6. Dyslipidemia associated with type 2 diabetes mellitus ([place])  Continue statin therapy   7. Abdominal aortic atherosclerosis (HCC)  On statin, Pradaxa and good bp control   8. Paroxysmal atrial fibrillation (HCC)  Continue Pradaxa   9. Gastro-esophageal reflux disease without esophagitis  Doing better, still  taking medication but only once a day  10. Chronic kidney insufficiency, stage 3 (moderate)  Sees Dr. Abigail Butts, last visit March 2018, we will recheck labs, bp is at goal

## 2017-01-07 ENCOUNTER — Other Ambulatory Visit: Payer: Self-pay | Admitting: Family Medicine

## 2017-01-07 DIAGNOSIS — K219 Gastro-esophageal reflux disease without esophagitis: Secondary | ICD-10-CM

## 2017-01-10 ENCOUNTER — Encounter: Payer: Self-pay | Admitting: Family Medicine

## 2017-01-10 DIAGNOSIS — E785 Hyperlipidemia, unspecified: Secondary | ICD-10-CM | POA: Diagnosis not present

## 2017-01-10 DIAGNOSIS — I10 Essential (primary) hypertension: Secondary | ICD-10-CM | POA: Diagnosis not present

## 2017-01-10 LAB — CBC WITH DIFFERENTIAL/PLATELET
BASOS PCT: 0 %
Basophils Absolute: 0 cells/uL (ref 0–200)
EOS PCT: 2 %
Eosinophils Absolute: 138 cells/uL (ref 15–500)
HCT: 42.9 % (ref 38.5–50.0)
HEMOGLOBIN: 14 g/dL (ref 13.2–17.1)
LYMPHS ABS: 1380 {cells}/uL (ref 850–3900)
Lymphocytes Relative: 20 %
MCH: 30.9 pg (ref 27.0–33.0)
MCHC: 32.6 g/dL (ref 32.0–36.0)
MCV: 94.7 fL (ref 80.0–100.0)
MPV: 10.9 fL (ref 7.5–12.5)
Monocytes Absolute: 759 cells/uL (ref 200–950)
Monocytes Relative: 11 %
NEUTROS ABS: 4623 {cells}/uL (ref 1500–7800)
NEUTROS PCT: 67 %
Platelets: 219 10*3/uL (ref 140–400)
RBC: 4.53 MIL/uL (ref 4.20–5.80)
RDW: 14.7 % (ref 11.0–15.0)
WBC: 6.9 10*3/uL (ref 3.8–10.8)

## 2017-01-11 ENCOUNTER — Encounter: Payer: Self-pay | Admitting: Family Medicine

## 2017-01-11 LAB — COMPLETE METABOLIC PANEL WITH GFR
ALT: 10 U/L (ref 9–46)
AST: 15 U/L (ref 10–35)
Albumin: 4.2 g/dL (ref 3.6–5.1)
Alkaline Phosphatase: 54 U/L (ref 40–115)
BUN: 26 mg/dL — AB (ref 7–25)
CALCIUM: 9.8 mg/dL (ref 8.6–10.3)
CHLORIDE: 104 mmol/L (ref 98–110)
CO2: 25 mmol/L (ref 20–32)
CREATININE: 1.52 mg/dL — AB (ref 0.70–1.18)
GFR, Est African American: 50 mL/min — ABNORMAL LOW (ref >=60)
GFR, Est Non African American: 43 mL/min — ABNORMAL LOW (ref >=60)
GLUCOSE: 86 mg/dL (ref 65–99)
POTASSIUM: 4.1 mmol/L (ref 3.5–5.3)
SODIUM: 142 mmol/L (ref 135–146)
Total Bilirubin: 0.6 mg/dL (ref 0.2–1.2)
Total Protein: 6.9 g/dL (ref 6.1–8.1)

## 2017-01-11 LAB — LIPID PANEL
CHOLESTEROL: 128 mg/dL (ref <200)
HDL: 57 mg/dL (ref >40)
LDL Cholesterol: 53 mg/dL (ref <100)
TRIGLYCERIDES: 89 mg/dL (ref <150)
Total CHOL/HDL Ratio: 2.2 Ratio (ref <5.0)
VLDL: 18 mg/dL (ref <30)

## 2017-01-25 ENCOUNTER — Emergency Department
Admission: EM | Admit: 2017-01-25 | Discharge: 2017-01-25 | Disposition: A | Discharge status: Home / Self Care | Payer: Medicare Other | Attending: Emergency Medicine | Admitting: Emergency Medicine

## 2017-01-25 ENCOUNTER — Emergency Department: Payer: Medicare Other

## 2017-01-25 DIAGNOSIS — Z794 Long term (current) use of insulin: Secondary | ICD-10-CM | POA: Diagnosis not present

## 2017-01-25 DIAGNOSIS — M79601 Pain in right arm: Secondary | ICD-10-CM | POA: Diagnosis not present

## 2017-01-25 DIAGNOSIS — N183 Chronic kidney disease, stage 3 (moderate): Secondary | ICD-10-CM | POA: Diagnosis not present

## 2017-01-25 DIAGNOSIS — Y939 Activity, unspecified: Secondary | ICD-10-CM | POA: Insufficient documentation

## 2017-01-25 DIAGNOSIS — S61411A Laceration without foreign body of right hand, initial encounter: Secondary | ICD-10-CM | POA: Diagnosis not present

## 2017-01-25 DIAGNOSIS — S5010XA Contusion of unspecified forearm, initial encounter: Secondary | ICD-10-CM

## 2017-01-25 DIAGNOSIS — E1122 Type 2 diabetes mellitus with diabetic chronic kidney disease: Secondary | ICD-10-CM | POA: Diagnosis not present

## 2017-01-25 DIAGNOSIS — S59911A Unspecified injury of right forearm, initial encounter: Secondary | ICD-10-CM | POA: Diagnosis present

## 2017-01-25 DIAGNOSIS — I5032 Chronic diastolic (congestive) heart failure: Secondary | ICD-10-CM | POA: Insufficient documentation

## 2017-01-25 DIAGNOSIS — S6000XA Contusion of unspecified finger without damage to nail, initial encounter: Secondary | ICD-10-CM

## 2017-01-25 DIAGNOSIS — S5011XA Contusion of right forearm, initial encounter: Secondary | ICD-10-CM | POA: Diagnosis not present

## 2017-01-25 DIAGNOSIS — S60021A Contusion of right index finger without damage to nail, initial encounter: Secondary | ICD-10-CM | POA: Insufficient documentation

## 2017-01-25 DIAGNOSIS — Y929 Unspecified place or not applicable: Secondary | ICD-10-CM | POA: Diagnosis not present

## 2017-01-25 DIAGNOSIS — Y999 Unspecified external cause status: Secondary | ICD-10-CM | POA: Insufficient documentation

## 2017-01-25 DIAGNOSIS — Z87891 Personal history of nicotine dependence: Secondary | ICD-10-CM | POA: Insufficient documentation

## 2017-01-25 DIAGNOSIS — I130 Hypertensive heart and chronic kidney disease with heart failure and stage 1 through stage 4 chronic kidney disease, or unspecified chronic kidney disease: Secondary | ICD-10-CM | POA: Insufficient documentation

## 2017-01-25 DIAGNOSIS — Z79899 Other long term (current) drug therapy: Secondary | ICD-10-CM | POA: Insufficient documentation

## 2017-01-25 DIAGNOSIS — S51811A Laceration without foreign body of right forearm, initial encounter: Secondary | ICD-10-CM | POA: Diagnosis not present

## 2017-01-25 MED ORDER — ACETAMINOPHEN 500 MG PO TABS: 1000.0000 mg | ORAL_TABLET | Freq: Once | ORAL | Status: DC

## 2017-01-25 MED ORDER — OXYCODONE HCL 5 MG PO TABS
5.0000 mg | ORAL_TABLET | Freq: Once | ORAL | Status: AC
  Administered 2017-01-25: 5 mg via ORAL
  Filled 2017-01-25: qty 1

## 2017-01-25 MED ORDER — OXYCODONE HCL 5 MG PO TABS: 2.5000 mg | ORAL_TABLET | Freq: Three times a day (TID) | ORAL | 0 refills | Status: DC | PRN
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
## 2017-01-25 NOTE — ED Notes (Signed)

## 2017-01-25 NOTE — ED Notes (Signed)
Ace wrap applied by this RN, CMS intact after ACE wrap applied. Pain medication administered per MD order. Waiting for patient's daughter to return to bedside for patient to be discharged.

## 2017-01-25 NOTE — ED Notes (Signed)
Pt's daughter states patient took 1,00mg  Tylenol at unknown time prior to arrival, states concerns about double dosing patient. This RN spoke with MD regarding patient's daughter's concerns. VORB for 5mg  Oxycodone IR received.

## 2017-01-25 NOTE — Discharge Instructions (Signed)
Apply ice, take tylenol 1000mg  every 8 hours and 2.5mg  of oxycodone as needed every 6 hours for pain. Apply ice and elevate.
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
## 2017-01-31 DIAGNOSIS — E1122 Type 2 diabetes mellitus with diabetic chronic kidney disease: Secondary | ICD-10-CM | POA: Diagnosis not present

## 2017-01-31 DIAGNOSIS — I129 Hypertensive chronic kidney disease with stage 1 through stage 4 chronic kidney disease, or unspecified chronic kidney disease: Secondary | ICD-10-CM | POA: Diagnosis not present

## 2017-01-31 DIAGNOSIS — N183 Chronic kidney disease, stage 3 (moderate): Secondary | ICD-10-CM | POA: Diagnosis not present

## 2017-01-31 DIAGNOSIS — R809 Proteinuria, unspecified: Secondary | ICD-10-CM | POA: Diagnosis not present

## 2017-02-14 DIAGNOSIS — M79674 Pain in right toe(s): Secondary | ICD-10-CM | POA: Diagnosis not present

## 2017-02-14 DIAGNOSIS — B351 Tinea unguium: Secondary | ICD-10-CM | POA: Diagnosis not present

## 2017-02-14 DIAGNOSIS — M79675 Pain in left toe(s): Secondary | ICD-10-CM | POA: Diagnosis not present

## 2017-02-25 ENCOUNTER — Other Ambulatory Visit: Payer: Self-pay | Admitting: Family Medicine

## 2017-02-25 DIAGNOSIS — E1122 Type 2 diabetes mellitus with diabetic chronic kidney disease: Secondary | ICD-10-CM

## 2017-02-25 DIAGNOSIS — IMO0002 Reserved for concepts with insufficient information to code with codable children: Secondary | ICD-10-CM

## 2017-02-25 DIAGNOSIS — N183 Chronic kidney disease, stage 3 (moderate): Principal | ICD-10-CM

## 2017-02-25 DIAGNOSIS — E1165 Type 2 diabetes mellitus with hyperglycemia: Principal | ICD-10-CM

## 2017-02-25 DIAGNOSIS — Z794 Long term (current) use of insulin: Principal | ICD-10-CM

## 2017-03-16 ENCOUNTER — Ambulatory Visit (INDEPENDENT_AMBULATORY_CARE_PROVIDER_SITE_OTHER): Payer: Medicare Other

## 2017-03-16 ENCOUNTER — Ambulatory Visit: Payer: Medicare Other

## 2017-03-16 DIAGNOSIS — Z23 Encounter for immunization: Secondary | ICD-10-CM

## 2017-05-16 ENCOUNTER — Other Ambulatory Visit: Payer: Self-pay | Admitting: Family Medicine

## 2017-05-18 ENCOUNTER — Other Ambulatory Visit: Payer: Self-pay | Admitting: Family Medicine

## 2017-05-18 DIAGNOSIS — I10 Essential (primary) hypertension: Secondary | ICD-10-CM

## 2017-05-30 DIAGNOSIS — I10 Essential (primary) hypertension: Secondary | ICD-10-CM | POA: Diagnosis not present

## 2017-05-30 DIAGNOSIS — E782 Mixed hyperlipidemia: Secondary | ICD-10-CM | POA: Diagnosis not present

## 2017-05-30 DIAGNOSIS — I4891 Unspecified atrial fibrillation: Secondary | ICD-10-CM | POA: Diagnosis not present

## 2017-05-30 DIAGNOSIS — I517 Cardiomegaly: Secondary | ICD-10-CM | POA: Diagnosis not present

## 2017-05-30 DIAGNOSIS — I340 Nonrheumatic mitral (valve) insufficiency: Secondary | ICD-10-CM | POA: Diagnosis not present

## 2017-05-30 DIAGNOSIS — I5032 Chronic diastolic (congestive) heart failure: Secondary | ICD-10-CM | POA: Diagnosis not present

## 2017-05-30 DIAGNOSIS — I700 Atherosclerosis of aorta: Secondary | ICD-10-CM | POA: Diagnosis not present

## 2017-05-30 DIAGNOSIS — I481 Persistent atrial fibrillation: Secondary | ICD-10-CM | POA: Diagnosis not present

## 2017-06-22 DIAGNOSIS — I517 Cardiomegaly: Secondary | ICD-10-CM | POA: Diagnosis not present

## 2017-07-02 ENCOUNTER — Other Ambulatory Visit: Payer: Self-pay | Admitting: Family Medicine

## 2017-07-02 DIAGNOSIS — I5032 Chronic diastolic (congestive) heart failure: Secondary | ICD-10-CM

## 2017-07-02 DIAGNOSIS — I10 Essential (primary) hypertension: Secondary | ICD-10-CM

## 2017-07-09 DIAGNOSIS — M79674 Pain in right toe(s): Secondary | ICD-10-CM | POA: Diagnosis not present

## 2017-07-09 DIAGNOSIS — B351 Tinea unguium: Secondary | ICD-10-CM | POA: Diagnosis not present

## 2017-07-09 DIAGNOSIS — N184 Chronic kidney disease, stage 4 (severe): Secondary | ICD-10-CM | POA: Diagnosis not present

## 2017-07-09 DIAGNOSIS — I10 Essential (primary) hypertension: Secondary | ICD-10-CM | POA: Diagnosis not present

## 2017-07-09 DIAGNOSIS — M79675 Pain in left toe(s): Secondary | ICD-10-CM | POA: Diagnosis not present

## 2017-07-09 DIAGNOSIS — E1159 Type 2 diabetes mellitus with other circulatory complications: Secondary | ICD-10-CM | POA: Diagnosis not present

## 2017-07-09 DIAGNOSIS — E119 Type 2 diabetes mellitus without complications: Secondary | ICD-10-CM | POA: Diagnosis not present

## 2017-07-09 DIAGNOSIS — E1169 Type 2 diabetes mellitus with other specified complication: Secondary | ICD-10-CM | POA: Diagnosis not present

## 2017-07-09 DIAGNOSIS — E785 Hyperlipidemia, unspecified: Secondary | ICD-10-CM | POA: Diagnosis not present

## 2017-07-09 DIAGNOSIS — E1122 Type 2 diabetes mellitus with diabetic chronic kidney disease: Secondary | ICD-10-CM | POA: Diagnosis not present

## 2017-07-09 DIAGNOSIS — Z794 Long term (current) use of insulin: Secondary | ICD-10-CM | POA: Diagnosis not present

## 2017-07-09 LAB — HEMOGLOBIN A1C: Hemoglobin A1C: 6.2

## 2017-07-13 ENCOUNTER — Ambulatory Visit (INDEPENDENT_AMBULATORY_CARE_PROVIDER_SITE_OTHER): Payer: PRIVATE HEALTH INSURANCE | Admitting: Family Medicine

## 2017-07-13 ENCOUNTER — Encounter: Payer: Self-pay | Admitting: Family Medicine

## 2017-07-13 VITALS — BP 122/74 | HR 82 | Temp 98.4°F | Resp 14 | Ht 68.0 in | Wt 172.1 lb

## 2017-07-13 DIAGNOSIS — I10 Essential (primary) hypertension: Secondary | ICD-10-CM | POA: Diagnosis not present

## 2017-07-13 DIAGNOSIS — I700 Atherosclerosis of aorta: Secondary | ICD-10-CM | POA: Diagnosis not present

## 2017-07-13 DIAGNOSIS — N183 Chronic kidney disease, stage 3 unspecified: Secondary | ICD-10-CM

## 2017-07-13 DIAGNOSIS — E46 Unspecified protein-calorie malnutrition: Secondary | ICD-10-CM

## 2017-07-13 DIAGNOSIS — I5032 Chronic diastolic (congestive) heart failure: Secondary | ICD-10-CM

## 2017-07-13 DIAGNOSIS — E1122 Type 2 diabetes mellitus with diabetic chronic kidney disease: Secondary | ICD-10-CM | POA: Diagnosis not present

## 2017-07-13 DIAGNOSIS — Z794 Long term (current) use of insulin: Secondary | ICD-10-CM

## 2017-07-13 DIAGNOSIS — E785 Hyperlipidemia, unspecified: Secondary | ICD-10-CM

## 2017-07-13 DIAGNOSIS — F1021 Alcohol dependence, in remission: Secondary | ICD-10-CM

## 2017-07-13 DIAGNOSIS — K219 Gastro-esophageal reflux disease without esophagitis: Secondary | ICD-10-CM

## 2017-07-13 MED ORDER — ROSUVASTATIN CALCIUM 40 MG PO TABS: ORAL_TABLET | ORAL | 1 refills | Status: DC

## 2017-07-13 MED ORDER — HYDROCHLOROTHIAZIDE 25 MG PO TABS: ORAL_TABLET | ORAL | 1 refills | Status: DC

## 2017-07-13 MED ORDER — LISINOPRIL 20 MG PO TABS: ORAL_TABLET | ORAL | 1 refills | Status: DC

## 2017-07-13 NOTE — Progress Notes (Addendum)
Name: Justin Manning   MRN: [id number]    DOB: 11/06/1935   Date:07/13/2017       Progress Note  Subjective  Chief Complaint  Chief Complaint  Patient presents with  . Follow-up  . Hypertension  . Hyperlipidemia    HPI   DMII: he used to see Dr. Graceann Congress, currently seeing Dr. Honor Junes, down to 10  units daily, he has been off Metformin and pre-meal insulin. hgbA1C is at goal now, 6.8% He has CKI stage III, foot exam doneby me 12/2016 , also seeing Dr. Cleda Mccreedy. He has been compliant with his diet, no longer eating candy.   Dyslipidemia: taking Crestor 40 mg daily and denies side effects, no myalgia. Reviewed labs done in 12/2016 and at goal   HTN: taking bp medication, denies side effects of medications. No chest pain or palpitation. Seeing Dr. Abigail Butts.   Afib: denies any symptoms, no palpitation, no chest pain, he has SOB with mild activity. Taking Pradaxa, denies bleeding , he sees Dr. Ubaldo Glassing Last echo showed global hypokinesis, but normal EF.   Lund nodule: seen on CT chest 2017 incidental finding, during CT abdomen, he denies cough, no increase in SOB, smaller in size when rechecked in 03/2015. He has quit smoking over 25 years ago and we will not recheck it any longer, patient agrees.   Chronic CHF - Diastolic : uses 2 pillows to sleep - but flat pillows, no edema, denies PND, he has SOB with mild activity,  no wheezing. He sees Dr. Ubaldo Glassing every 6 months, last visit was  06/2017  Malnutrition: he lost 20 lbs since 2017, but weight has been stable for the past 6 months, he has been drinking Ensure as a supplement.   GERD: under control at this time, he is off Pantoprazole  Atherosclerosis abdominal, carotid: he is on stating therapy, and Pradaxa. BP is at goal   History of alcoholism: quit many years ago   Patient Active Problem List   Diagnosis Date Noted  . History of alcoholism ([place]) 01/05/2017  . Hiatal hernia 04/11/2015  . Emphysema lung ([place]) 04/11/2015  .  Protein malnutrition ([place]) 03/18/2015  . Anemia in chronic illness 11/09/2014  . Abdominal aortic atherosclerosis ([place]) 11/09/2014  . A-fib ([place]) 11/09/2014  . Atrial hypertrophy 11/09/2014  . Carotid arterial disease ([place]) 11/09/2014  . Chronic kidney disease (CKD), stage III (moderate) ([place]) 11/09/2014  . Diabetes mellitus with renal manifestations, uncontrolled ([place]) 11/09/2014  . Diastolic dysfunction with heart failure ([place]) 11/09/2014  . Dysfunction of eustachian tube 11/09/2014  . Dyslipidemia associated with type 2 diabetes mellitus ([place]) 11/09/2014  . Essential (primary) hypertension 11/09/2014  . Gastro-esophageal reflux disease without esophagitis 11/09/2014  . Hearing loss of left ear 11/09/2014  . H/O carotid endarterectomy 11/09/2014  . H/O malignant neoplasm of colon 11/09/2014  . H/O infectious disease 11/09/2014  . H/O malignant neoplasm of prostate 11/09/2014  . Lung nodule, solitary 11/09/2014  . Mild cognitive disorder 11/09/2014  . TI (tricuspid incompetence) 11/09/2014    Past Surgical History:  Procedure Laterality Date  . ENDARTERECTOMY Left    Carotid  . KNEE SURGERY Right   . PARTIAL COLECTOMY     with Anastomosis  . SHOULDER SURGERY Right     Family History  Problem Relation Age of Onset  . Alcohol abuse Brother   . Diabetes Daughter     Social History   Socioeconomic History  . Marital status: Single    Spouse name: Not on file  .  Number of children: 3  . Years of education: 12th   . Highest education level: Not on file  Social Needs  . Financial resource strain: Not on file  . Food insecurity - worry: Not on file  . Food insecurity - inability: Not on file  . Transportation needs - medical: Not on file  . Transportation needs - non-medical: Not on file  Occupational History  . Occupation: retired  Tobacco Use  . Smoking status: Former Smoker    Last attempt to quit: 05/15/1988    Years since quitting: 29.1  . Smokeless tobacco:  Never Used  Substance and Sexual Activity  . Alcohol use: No    Alcohol/week: 0.0 oz    Comment: used to be a heavy drinker but quit 40 years ago   . Drug use: Yes    Types: Marijuana    Comment: many years ago   . Sexual activity: Not Currently  Other Topics Concern  . Not on file  Social History Narrative   He lives alone, but his youngest son moved in with him in 2017   He states that his son does not want to work, and is stressing him out.      Current Outpatient Medications:  .  Cyanocobalamin (VITAMIN B 12) 100 MCG LOZG, Take 100 mcg by mouth daily. , Disp: , Rfl:  .  dabigatran (PRADAXA) 75 MG CAPS capsule, Take 2 tablets by mouth 2 (two) times daily., Disp: , Rfl:  .  diltiazem (CARTIA XT) 240 MG 24 hr capsule, Take 1 capsule by mouth daily., Disp: , Rfl:  .  hydrALAZINE (APRESOLINE) 50 MG tablet, Take 1 tablet by mouth 4 (four) times daily., Disp: , Rfl:  .  hydrochlorothiazide (HYDRODIURIL) 25 MG tablet, Take it daily, Disp: 90 tablet, Rfl: 1 .  lisinopril (PRINIVIL,ZESTRIL) 20 MG tablet, TAKE 1 TABLET(20 MG) BY MOUTH TWICE DAILY, Disp: 180 tablet, Rfl: 1 .  rosuvastatin (CRESTOR) 40 MG tablet, TAKE 1 TABLET(40 MG) BY MOUTH DAILY, Disp: 90 tablet, Rfl: 1 .  B-D ULTRAFINE III SHORT PEN 31G X 8 MM MISC, USE AS DIRECTED, Disp: 100 each, Rfl: 0 .  FREESTYLE LITE test strip, TEST BLOOD SUGAR THREE TIMES DAILY AS DIRECTED, Disp: 300 each, Rfl: 2 .  INS SYRINGE/NEEDLE 1CC/28G 28G X 1/2" 1 ML MISC, Insulin injecting ICD-9 250.00 Inject daily 100 unit syringe (14ml), Disp: , Rfl:  .  LANTUS SOLOSTAR 100 UNIT/ML Solostar Pen, INJECT 20 UNITS UNDER THE SKIN EVERY DAY, Disp: 15 mL, Rfl: 0 .  NEEDLE, DISP, 20 G (BD DISP NEEDLES) 20G X 1" MISC, 1 Units by Does not apply route daily., Disp: 50 each, Rfl: 5  Allergies  Allergen Reactions  . Aspirin     tachycardia     ROS  Constitutional: Negative for fever or weight change.  Respiratory: Negative for cough , no change in his  baseline of  shortness of breath.   Cardiovascular: Negative for chest pain or palpitations.  Gastrointestinal: Negative for abdominal pain, no bowel changes.  Musculoskeletal: positive for gait problem ( uses a cane)  But no  joint swelling.  Skin: Negative for rash.  Neurological: Negative for dizziness or headache.  No other specific complaints in a complete review of systems (except as listed in HPI above).  Objective  Vitals:   07/13/17 1506  BP: 122/74  Pulse: 82  Resp: 14  Temp: 98.4 F (36.9 C)  TempSrc: Oral  SpO2: 98%  Weight: 172 lb 1.6  oz (78.1 kg)  Height: 5\' 8"  (1.727 m)    Body mass index is 26.17 kg/m.  Physical Exam  Constitutional: Patient appears well-developed and well-nourished.. No distress.  HEENT: head atraumatic, normocephalic, pupils equal and reactive to light, neck supple, throat within normal limits Cardiovascular: Normal rate, irregular rhythm and normal heart sounds.  No murmur heard. No BLE edema. Pulmonary/Chest: Effort normal and breath sounds normal. No respiratory distress. Abdominal: Soft.  There is no tenderness. Muscular skeletal: slow gait and uses cane Psychiatric: Patient has a normal mood and affect. behavior is normal. Judgment and thought content normal.   Recent Results (from the past 2160 hour(s))  Hemoglobin A1c     Status: None   Collection Time: 07/09/17 12:00 AM  Result Value Ref Range   Hemoglobin A1C 6.2      PHQ2/9: Depression screen [facility] 2/9 07/13/2017 01/05/2017 07/07/2016 01/07/2016 08/16/2015  Decreased Interest 0 0 0 0 0  Down, Depressed, Hopeless 0 0 0 0 0  PHQ - 2 Score 0 0 0 0 0     Fall Risk: Fall Risk  07/13/2017 01/05/2017 07/07/2016 01/07/2016 08/16/2015  Falls in the past year? No No No No No     Functional Status Survey: Is the patient deaf or have difficulty hearing?: No Does the patient have difficulty seeing, even when wearing glasses/contacts?: No Does the patient have difficulty concentrating,  remembering, or making decisions?: No Does the patient have difficulty walking or climbing stairs?: Yes Does the patient have difficulty dressing or bathing?: No Does the patient have difficulty doing errands alone such as visiting a doctor's office or shopping?: Yes    Assessment & Plan  1. Controlled type 2 diabetes mellitus with stage 3 chronic kidney disease, with long-term current use of insulin ([place])  Doing well, hgbA1C is at goal   2. Chronic diastolic heart failure (HCC)  - lisinopril (PRINIVIL,ZESTRIL) 20 MG tablet; TAKE 1 TABLET(20 MG) BY MOUTH TWICE DAILY  Dispense: 180 tablet; Refill: 1  3. History of alcoholism ([place])   4. Essential (primary) hypertension  - hydrochlorothiazide (HYDRODIURIL) 25 MG tablet; Take it daily  Dispense: 90 tablet; Refill: 1 - lisinopril (PRINIVIL,ZESTRIL) 20 MG tablet; TAKE 1 TABLET(20 MG) BY MOUTH TWICE DAILY  Dispense: 180 tablet; Refill: 1  5. Dyslipidemia  - rosuvastatin (CRESTOR) 40 MG tablet; TAKE 1 TABLET(40 MG) BY MOUTH DAILY  Dispense: 90 tablet; Refill: 1  6. Abdominal aortic atherosclerosis (HCC)  Continue current medication   7. Gastroesophageal reflux disease without esophagitis  Doing well without medication   8. Protein malnutrition ([place])  Continue Ensure  9. Chronic kidney insufficiency, stage 3 (moderate) (HCC)  Continue follow up with Dr. Abigail Butts

## 2017-07-23 ENCOUNTER — Telehealth: Payer: Self-pay

## 2017-07-23 MED ORDER — GLUCOSE BLOOD VI STRP: ORAL_STRIP | 2 refills | Status: DC

## 2017-07-23 NOTE — Telephone Encounter (Signed)
Patient daughter states since his A1C has dropped he was told he could resume his DM care with Dr. Ancil Boozer. They are asking Dr. Ancil Boozer send in a prescription for his Freestyle Test strips to [facility]. His Insurance might asked [place] to filled out a form for coverage but we first have to send in the prescription.

## 2017-07-26 NOTE — Telephone Encounter (Signed)
Pt wife called stating [facility] advised her they are waiting on Dr. Ancil Boozer to complete and return a form required by [facility] for the test strips. They cannot fill the RX until it is returned. Pt is down to 5 strips. Please advise (call Teresa's work #).

## 2017-07-27 NOTE — Telephone Encounter (Signed)
Paperwork has been placed in PCP folder and waiting on her to sign.

## 2017-08-02 ENCOUNTER — Telehealth: Payer: Self-pay | Admitting: Family Medicine

## 2017-08-02 NOTE — Telephone Encounter (Signed)
Faxed was sent on 07/30/2017 at 16:50 with successful transmission. I called [facility] Medicare Department at [phone] and spoke with Ivin Booty at 11:30 a.m. Which states she never received the paper work necessary to fill patient's test strips. I refaxed it at 10:40 a.m. And 10:45 a.m. Today 08/02/2017. Call back at 12:38 p.m. Which [facility] Medicare customer service representative states they did receive the paper work on 08/02/2017. Patient daughter Justin Manning was notified and told me she would have to call to get prescription pushed through to be filled at [facility] locally. Informed Justin Manning if prescription did not go through to call us back and we will call again and refax documents to their Retail Medicare Department.

## 2017-08-02 NOTE — Telephone Encounter (Signed)
Justin Manning at [employer] states she contacted [employer] and they told her they don't have the form. She was advised by the daughter that we sent it and all info in the note below. She just got off the phone and they told her they don't have it.   Justin Manning can scan to the Medicare Dept if we can fax directly to her so she can process the order today. Fax: [phone] Phone # [phone]

## 2017-08-02 NOTE — Telephone Encounter (Signed)
Copied from [place] [phone]. Topic: Inquiry >> Aug 02, 2017  9:50 AM Pricilla Handler wrote: Reason for CRM: Patient's daughter [name] called stating that they have been waiting for days for patient's Glucose blood (FREESTYLE LITE) test strips. [name] sates that they need the strips sent to patient's preferred pharmacy: [facility] [place], [place] [phone] (Phone)   [phone] (Fax). Patient's daughter also states that medicare has faxed a form that needs to be corrected and faxed back to the [facility] Department [phone], 8am to 4:30pm Central) for his test strips. Patient's daughter wants this taken care of today. Patient has no strips, and needs them today. Please call patient's daughter at [phone].       Thank You!!!

## 2017-08-03 NOTE — Telephone Encounter (Signed)
Information has been faxed to [place] at [phone].

## 2017-09-15 ENCOUNTER — Other Ambulatory Visit: Payer: Self-pay | Admitting: Family Medicine

## 2017-09-21 DIAGNOSIS — N183 Chronic kidney disease, stage 3 (moderate): Secondary | ICD-10-CM | POA: Diagnosis not present

## 2017-09-21 DIAGNOSIS — R809 Proteinuria, unspecified: Secondary | ICD-10-CM | POA: Diagnosis not present

## 2017-09-21 DIAGNOSIS — I129 Hypertensive chronic kidney disease with stage 1 through stage 4 chronic kidney disease, or unspecified chronic kidney disease: Secondary | ICD-10-CM | POA: Diagnosis not present

## 2017-09-21 DIAGNOSIS — E1122 Type 2 diabetes mellitus with diabetic chronic kidney disease: Secondary | ICD-10-CM | POA: Diagnosis not present

## 2017-10-02 DIAGNOSIS — E119 Type 2 diabetes mellitus without complications: Secondary | ICD-10-CM | POA: Diagnosis not present

## 2017-10-02 LAB — HM DIABETES EYE EXAM

## 2017-10-04 ENCOUNTER — Encounter: Payer: Self-pay | Admitting: Family Medicine

## 2017-10-04 ENCOUNTER — Other Ambulatory Visit: Payer: Self-pay

## 2017-11-22 DIAGNOSIS — I5032 Chronic diastolic (congestive) heart failure: Secondary | ICD-10-CM | POA: Diagnosis not present

## 2017-11-22 DIAGNOSIS — I700 Atherosclerosis of aorta: Secondary | ICD-10-CM | POA: Diagnosis not present

## 2017-11-22 DIAGNOSIS — E1159 Type 2 diabetes mellitus with other circulatory complications: Secondary | ICD-10-CM | POA: Diagnosis not present

## 2017-11-22 DIAGNOSIS — I10 Essential (primary) hypertension: Secondary | ICD-10-CM | POA: Diagnosis not present

## 2017-11-22 DIAGNOSIS — I340 Nonrheumatic mitral (valve) insufficiency: Secondary | ICD-10-CM | POA: Diagnosis not present

## 2017-11-22 DIAGNOSIS — I517 Cardiomegaly: Secondary | ICD-10-CM | POA: Diagnosis not present

## 2017-11-22 DIAGNOSIS — I4891 Unspecified atrial fibrillation: Secondary | ICD-10-CM | POA: Diagnosis not present

## 2017-12-26 ENCOUNTER — Telehealth: Payer: Self-pay | Admitting: Family Medicine

## 2017-12-26 NOTE — Telephone Encounter (Signed)
Left message on voice mail requesting a call back to my direct line, [phone]. Trying to schedule Justin Manning to see the [facility] for his medicare annual wellness visit on the same day that he sees Dr.Sowles. 01/18/18 Asked if he could come in at 2:10 to see the [facility] and see the provider right after.

## 2018-01-13 ENCOUNTER — Other Ambulatory Visit: Payer: Self-pay | Admitting: Family Medicine

## 2018-01-13 DIAGNOSIS — E785 Hyperlipidemia, unspecified: Secondary | ICD-10-CM

## 2018-01-18 ENCOUNTER — Encounter: Payer: Self-pay | Admitting: Family Medicine

## 2018-01-18 ENCOUNTER — Ambulatory Visit (INDEPENDENT_AMBULATORY_CARE_PROVIDER_SITE_OTHER): Payer: Medicare Other

## 2018-01-18 ENCOUNTER — Ambulatory Visit (INDEPENDENT_AMBULATORY_CARE_PROVIDER_SITE_OTHER): Payer: Medicare Other | Admitting: Family Medicine

## 2018-01-18 VITALS — BP 144/68 | HR 73 | Temp 97.6°F | Resp 14 | Ht 68.0 in | Wt 169.3 lb

## 2018-01-18 VITALS — BP 144/68 | HR 73 | Temp 97.6°F | Resp 12 | Ht 68.0 in | Wt 169.3 lb

## 2018-01-18 DIAGNOSIS — N183 Chronic kidney disease, stage 3 unspecified: Secondary | ICD-10-CM

## 2018-01-18 DIAGNOSIS — E1122 Type 2 diabetes mellitus with diabetic chronic kidney disease: Secondary | ICD-10-CM

## 2018-01-18 DIAGNOSIS — J439 Emphysema, unspecified: Secondary | ICD-10-CM

## 2018-01-18 DIAGNOSIS — Z0000 Encounter for general adult medical examination without abnormal findings: Secondary | ICD-10-CM

## 2018-01-18 DIAGNOSIS — D649 Anemia, unspecified: Secondary | ICD-10-CM

## 2018-01-18 DIAGNOSIS — Z23 Encounter for immunization: Secondary | ICD-10-CM

## 2018-01-18 DIAGNOSIS — I700 Atherosclerosis of aorta: Secondary | ICD-10-CM

## 2018-01-18 DIAGNOSIS — F09 Unspecified mental disorder due to known physiological condition: Secondary | ICD-10-CM

## 2018-01-18 DIAGNOSIS — I5032 Chronic diastolic (congestive) heart failure: Secondary | ICD-10-CM | POA: Diagnosis not present

## 2018-01-18 DIAGNOSIS — F1021 Alcohol dependence, in remission: Secondary | ICD-10-CM

## 2018-01-18 DIAGNOSIS — E1169 Type 2 diabetes mellitus with other specified complication: Secondary | ICD-10-CM

## 2018-01-18 DIAGNOSIS — I480 Paroxysmal atrial fibrillation: Secondary | ICD-10-CM

## 2018-01-18 DIAGNOSIS — Z794 Long term (current) use of insulin: Secondary | ICD-10-CM

## 2018-01-18 DIAGNOSIS — E785 Hyperlipidemia, unspecified: Secondary | ICD-10-CM

## 2018-01-18 DIAGNOSIS — I10 Essential (primary) hypertension: Secondary | ICD-10-CM

## 2018-01-18 MED ORDER — LISINOPRIL 20 MG PO TABS: ORAL_TABLET | ORAL | 1 refills | Status: DC

## 2018-01-18 MED ORDER — ROSUVASTATIN CALCIUM 40 MG PO TABS: ORAL_TABLET | ORAL | 0 refills | Status: DC

## 2018-01-18 MED ORDER — INSULIN PEN NEEDLE 31G X 8 MM MISC: 3 refills | Status: DC

## 2018-01-18 MED ORDER — INSULIN GLARGINE 100 UNIT/ML SOLOSTAR PEN: 10.0000 [IU] | PEN_INJECTOR | Freq: Every day | SUBCUTANEOUS | 1 refills | Status: DC

## 2018-01-18 MED ORDER — NEEDLE (DISP) 20G X 1" MISC
1.0000 [IU] | Freq: Every day | 2 refills | Status: DC
Start: 2018-01-18 — End: 2018-01-18

## 2018-01-18 MED ORDER — HYDROCHLOROTHIAZIDE 25 MG PO TABS: ORAL_TABLET | ORAL | 1 refills | Status: DC

## 2018-01-18 NOTE — Progress Notes (Addendum)
Subjective:   Justin Manning is a 82 y.o. male who presents for Medicare Annual/Subsequent preventive examination.  Review of Systems:  N/A Cardiac Risk Factors include: advanced age (>76men, >23 women);dyslipidemia;diabetes mellitus;hypertension;male gender;obesity (BMI >30kg/m2);sedentary lifestyle     Objective:    Vitals: BP (!) 144/68 (BP Location: Right Arm, Patient Position: Sitting, Cuff Size: Normal)   Pulse 73   Temp 97.6 F (36.4 C) (Oral)   Resp 14   Ht 5\' 8"  (1.727 m)   Wt 169 lb 4.8 oz (76.8 kg)   SpO2 94%   BMI 25.74 kg/m   Body mass index is 25.74 kg/m.  Advanced Directives 01/18/2018 01/25/2017 01/05/2017 07/07/2016 08/16/2015 06/25/2015 05/18/2015  Does Patient Have a Medical Advance Directive? No Yes No Yes No No No  Type of Advance Directive - [place] in Chart? - No - copy requested - No - copy requested - - -  Would patient like information on creating a medical advance directive? Yes (MAU/Ambulatory/Procedural Areas - Information given) - - - No - patient declined information No - patient declined information No - patient declined information    Tobacco Social History   Tobacco Use  Smoking Status Former Smoker  . Packs/day: 1.00  . Years: 10.00  . Pack years: 10.00  . Types: Cigarettes  . Last attempt to quit: 05/15/1988  . Years since quitting: 29.6  Smokeless Tobacco Never Used  Tobacco Comment   smoking cessation materials not required     Counseling given: No Comment: smoking cessation materials not required  Clinical Intake:  Pre-visit preparation completed: Yes  Pain : No/denies pain   BMI - recorded: 25.74 Nutritional Status: BMI > 30  Obese Nutritional Risks: None  Nutrition Risk Assessment: Has the patient had any N/V/D within the last 2 months?  No Does the patient have any non-healing wounds?  No Has the patient had any  unintentional weight loss or weight gain?  No  Is the patient diabetic?  Yes If diabetic, was a CBG obtained today?  No Did the patient bring in their glucometer from home?  No Comments: Pt monitors CBG's daily. Denies any financial strains with the device or supplies.  Diabetic Exams: Diabetic Eye Exam: Completed 10/02/17.  Diabetic Foot Exam: Completed 01/05/17.   How often do you need to have someone help you when you read instructions, pamphlets, or other written materials from your doctor or pharmacy?: 1 - Never  Interpreter Needed?: No  Information entered by :: Idell Pickles, LPN  Past Medical History:  Diagnosis Date  . Anemia   . CKD (chronic kidney disease), stage III (HCC)    Dr. Juleen China  . Diabetes mellitus without complication ([place])   . GERD (gastroesophageal reflux disease)   . Hyperlipidemia   . Hypertension   . Mitral regurgitation    Mild   Past Surgical History:  Procedure Laterality Date  . ENDARTERECTOMY Left    Carotid  . KNEE SURGERY Right   . PARTIAL COLECTOMY     with Anastomosis  . SHOULDER SURGERY Right    Family History  Problem Relation Age of Onset  . Alcohol abuse Brother   . Diabetes Daughter    Social History   Socioeconomic History  . Marital status: Widowed    Spouse name: Justin Manning  . Number of children: 3  . Years of education: Not on file  . Highest  education level: 11th grade  Occupational History  . Occupation: retired  Scientific laboratory technician  . Financial resource strain: Not hard at all  . Food insecurity:    Worry: Never true    Inability: Never true  . Transportation needs:    Medical: No    Non-medical: No  Tobacco Use  . Smoking status: Former Smoker    Packs/day: 1.00    Years: 10.00    Pack years: 10.00    Types: Cigarettes    Last attempt to quit: 05/15/1988    Years since quitting: 29.6  . Smokeless tobacco: Never Used  . Tobacco comment: smoking cessation materials not required  Substance and Sexual Activity  .  Alcohol use: No    Alcohol/week: 0.0 standard drinks    Comment: used to be a heavy drinker but quit 40 years ago   . Drug use: Yes    Types: Marijuana    Comment: many years ago   . Sexual activity: Not Currently  Lifestyle  . Physical activity:    Days per week: 0 days    Minutes per session: 0 min  . Stress: Not at all  Relationships  . Social connections:    Talks on phone: Patient refused    Gets together: Patient refused    Attends religious service: Patient refused    Active member of club or organization: Patient refused    Attends meetings of clubs or organizations: Patient refused    Relationship status: Patient refused  Other Topics Concern  . Not on file  Social History Narrative   He lives alone, but his youngest son moved in with him in 2017   He states that his son does not want to work, and is stressing him out.     Outpatient Encounter Medications as of 01/18/2018  Medication Sig  . B-D ULTRAFINE III SHORT PEN 31G X 8 MM MISC USE AS DIRECTED  . Cyanocobalamin (VITAMIN B 12) 100 MCG LOZG Take 100 mcg by mouth daily.   . dabigatran (PRADAXA) 75 MG CAPS capsule Take 2 tablets by mouth 2 (two) times daily.  Marland Kitchen diltiazem (CARTIA XT) 240 MG 24 hr capsule Take 1 capsule by mouth daily.  Marland Kitchen glucose blood (FREESTYLE LITE) test strip TEST BLOOD SUGAR THREE TIMES DAILY AS DIRECTED  . hydrALAZINE (APRESOLINE) 50 MG tablet Take 1 tablet by mouth 4 (four) times daily.  . hydrochlorothiazide (HYDRODIURIL) 25 MG tablet Take it daily  . INS SYRINGE/NEEDLE 1CC/28G 28G X 1/2" 1 ML MISC Insulin injecting ICD-9 250.00 Inject daily 100 unit syringe (43ml)  . LANTUS SOLOSTAR 100 UNIT/ML Solostar Pen INJECT 20 UNITS UNDER THE SKIN EVERY DAY  . lisinopril (PRINIVIL,ZESTRIL) 20 MG tablet TAKE 1 TABLET(20 MG) BY MOUTH TWICE DAILY  . NEEDLE, DISP, 20 G (BD DISP NEEDLES) 20G X 1" MISC 1 Units by Does not apply route daily.  . rosuvastatin (CRESTOR) 40 MG tablet TAKE 1 TABLET(40 MG) BY MOUTH  DAILY   No facility-administered encounter medications on file as of 01/18/2018.     Activities of Daily Living In your present state of health, do you have any difficulty performing the following activities: 01/18/2018 07/13/2017  Hearing? N N  Comment denies hearing aids -  Vision? N N  Comment wears eyeglasses -  Difficulty concentrating or making decisions? N N  Walking or climbing stairs? Y Y  Comment avoids stairs -  Dressing or bathing? N N  Doing errands, shopping? Y Y  Comment dtr transports -  Preparing Food and eating ? N -  Comment full upper dentures -  Using the Toilet? N -  In the past six months, have you accidently leaked urine? N -  Do you have problems with loss of bowel control? N -  Managing your Medications? N -  Managing your Finances? N -  Housekeeping or managing your Housekeeping? N -  Some recent data might be hidden    Patient Care Team: Steele Sizer, MD as PCP - General (Family Medicine) Sharlotte Alamo, DPM as Consulting Physician (Podiatry) Ubaldo Glassing Javier Docker, MD as Consulting Physician (Cardiology) Lavonia Dana, MD as Consulting Physician (Nephrology)   Assessment:   This is a routine wellness examination for Justin Manning.  Exercise Activities and Dietary recommendations Current Exercise Habits: The patient does not participate in regular exercise at present, Exercise limited by: None identified  Goals    . DIET - INCREASE WATER INTAKE     Recommend to drink at least 6-8 8oz glasses of water per day.       Fall Risk Fall Risk  01/18/2018 07/13/2017 01/05/2017 07/07/2016 01/07/2016  Falls in the past year? No No No No No  Risk for fall due to : Impaired vision;Impaired balance/gait - - - -  Risk for fall due to: Comment wears eyeglasses; ambulates with cane - - - -   FALL RISK PREVENTION PERTAINING TO HOME: Is your home free of loose throw rugs in walkways, pet beds, electrical cords, etc? Yes Is there adequate lighting in your home to reduce risk of  falls?  Yes Are there stairs in or around your home WITH handrails? No stairs  ASSISTIVE DEVICES UTILIZED TO PREVENT FALLS: Use of a cane, walker or w/c? Yes, cane Grab bars in the bathroom? Yes  Shower chair or a place to sit while bathing? No An elevated toilet seat or a handicapped toilet? No  Timed Get Up and Go Performed: Yes. Pt ambulated 10 feet within 29 sec. Gait slow, steady and with the use of an assistive device. No intervention required at this time. Fall risk prevention has been discussed.  Community Resource Referral:  Pt declined my offer to send [employer] Referral to Care Guide for a shower chair or an elevated toilet seat.  Depression Screen PHQ 2/9 Scores 01/18/2018 07/13/2017 01/05/2017 07/07/2016  PHQ - 2 Score 0 0 0 0  PHQ- 9 Score 0 - - -    Cognitive Function     6CIT Screen 01/18/2018  What Year? 4 points  What month? 3 points  What time? 3 points  Count back from 20 4 points  Months in reverse 4 points  Repeat phrase 10 points  Total Score 28    Immunization History  Administered Date(s) Administered  . Influenza, High Dose Seasonal PF 03/18/2015, 03/17/2016, 03/16/2017  . Influenza-Unspecified 03/13/2013  . Pneumococcal Conjugate-13 03/18/2015  . Pneumococcal Polysaccharide-23 11/07/2012, 02/06/2014  . Tdap 02/06/2014    Qualifies for Shingles Vaccine? Yes. Due for Shingrix. Education has been provided regarding the importance of this vaccine. Pt has been advised to call insurance company to determine out of pocket expense. Advised may also receive vaccine at local pharmacy or Health Dept. Verbalized acceptance and understanding.  Due for Flu vaccine. Pt did not declined to receive today, however, dtr declined my offer to administer this vaccine for her father today. Education has been provided regarding the importance of this vaccine but dtr still declined for her father to receive. Advised may receive this vaccine at  local pharmacy or Health  Dept. Aware to provide a copy of the vaccination record if obtained from local pharmacy or Health Dept. Verbalized acceptance and understanding.  Screening Tests Health Maintenance  Topic Date Due  . FOOT EXAM  01/05/2018  . HEMOGLOBIN A1C  01/06/2018  . INFLUENZA VACCINE  09/07/2018 (Originally 12/13/2017)  . OPHTHALMOLOGY EXAM  10/03/2018  . TETANUS/TDAP  02/07/2024  . PNA vac Low Risk Adult  Completed   Cancer Screenings: Lung: Low Dose CT Chest recommended if Age 19-80 years, 30 pack-year currently smoking OR have quit w/in 15years. Patient does not qualify. Colorectal: No longer required  Additional Screenings: Hepatitis C Screening: Does not qualify     Plan:  I have personally reviewed and addressed the Medicare Annual Wellness questionnaire and have noted the following in the patient's chart:  A. Medical and social history B. Use of alcohol, tobacco or illicit drugs  C. Current medications and supplements D. Functional ability and status E.  Nutritional status F.  Physical activity G. Advance directives H. List of other physicians I.  Hospitalizations, surgeries, and ER visits in previous 12 months J.  [place] such as hearing and vision if needed, cognitive and depression L. Referrals and appointments  In addition, I have reviewed and discussed with patient certain preventive protocols, quality metrics, and best practice recommendations. A written personalized care plan for preventive services as well as general preventive health recommendations were provided to patient.  See attached scanned questionnaire for additional information.   Signed,  Aleatha Borer, LPN Nurse Health Advisor

## 2018-01-18 NOTE — Patient Instructions (Signed)
Justin Manning , Thank you for taking time to come for your Medicare Wellness Visit. I appreciate your ongoing commitment to your health goals. Please review the following plan we discussed and let me know if I can assist you in the future.   Screening recommendations/referrals: Colorectal Screening: No longer required  Vision and Dental Exams: Recommended annual ophthalmology exams for early detection of glaucoma and other disorders of the eye Recommended annual dental exams for proper oral hygiene  Diabetic Exams: Recommended annual diabetic eye exams for early detection of retinopathy Recommended annual diabetic foot exams for early detection of peripheral neuropathy.  Diabetic Eye Exam: Up to date Diabetic Foot Exam: To be completed today  Vaccinations: Influenza vaccine: Declined Pneumococcal vaccine: Up to date Tdap vaccine: Up to date Shingles vaccine: Please call your insurance company to determine your out of pocket expense for the Shingrix vaccine. You may also receive this vaccine at your local pharmacy or Health Dept.    Advanced directives: Advance directive discussed with you today. I have provided a copy for you to complete at home and have notarized. Once this is complete please bring a copy in to our office so we can scan it into your chart.  Goals: Recommend to drink at least 6-8 8oz glasses of water per day.  Next appointment: Please schedule your Annual Wellness Visit with your Nurse Health Advisor in one year.  Preventive Care 24 Years and Older, Male Preventive care refers to lifestyle choices and visits with your health care provider that can promote health and wellness. What does preventive care include?  A yearly physical exam. This is also called an annual well check.  Dental exams once or twice a year.  Routine eye exams. Ask your health care provider how often you should have your eyes checked.  Personal lifestyle choices, including:  Daily care of your  teeth and gums.  Regular physical activity.  Eating a healthy diet.  Avoiding tobacco and drug use.  Limiting alcohol use.  Practicing safe sex.  Taking low doses of aspirin every day.  Taking vitamin and mineral supplements as recommended by your health care provider. What happens during an annual well check? The services and screenings done by your health care provider during your annual well check will depend on your age, overall health, lifestyle risk factors, and family history of disease. Counseling  Your health care provider may ask you questions about your:  Alcohol use.  Tobacco use.  Drug use.  Emotional well-being.  Home and relationship well-being.  Sexual activity.  Eating habits.  History of falls.  Memory and ability to understand (cognition).  Work and work Statistician. Screening  You may have the following tests or measurements:  Height, weight, and BMI.  Blood pressure.  Lipid and cholesterol levels. These may be checked every 5 years, or more frequently if you are over 67 years old.  Skin check.  Lung cancer screening. You may have this screening every year starting at age 19 if you have a 30-pack-year history of smoking and currently smoke or have quit within the past 15 years.  Fecal occult blood test (FOBT) of the stool. You may have this test every year starting at age 55.  Flexible sigmoidoscopy or colonoscopy. You may have a sigmoidoscopy every 5 years or a colonoscopy every 10 years starting at age 75.  Prostate cancer screening. Recommendations will vary depending on your family history and other risks.  Hepatitis C blood test.  Hepatitis B blood  test.  Sexually transmitted disease (STD) testing.  Diabetes screening. This is done by checking your blood sugar (glucose) after you have not eaten for a while (fasting). You may have this done every 1-3 years.  Abdominal aortic aneurysm (AAA) screening. You may need this if you  are a current or former smoker.  Osteoporosis. You may be screened starting at age 32 if you are at high risk. Talk with your health care provider about your test results, treatment options, and if necessary, the need for more tests. Vaccines  Your health care provider may recommend certain vaccines, such as:  Influenza vaccine. This is recommended every year.  Tetanus, diphtheria, and acellular pertussis (Tdap, Td) vaccine. You may need a Td booster every 10 years.  Zoster vaccine. You may need this after age 53.  Pneumococcal 13-valent conjugate (PCV13) vaccine. One dose is recommended after age 49.  Pneumococcal polysaccharide (PPSV23) vaccine. One dose is recommended after age 73. Talk to your health care provider about which screenings and vaccines you need and how often you need them. This information is not intended to replace advice given to you by your health care provider. Make sure you discuss any questions you have with your health care provider. Document Released: 05/28/2015 Document Revised: 01/19/2016 Document Reviewed: 03/02/2015 Elsevier Interactive Patient Education  2017 [place] Prevention in the Home Falls can cause injuries. They can happen to people of all ages. There are many things you can do to make your home safe and to help prevent falls. What can I do on the outside of my home?  Regularly fix the edges of walkways and driveways and fix any cracks.  Remove anything that might make you trip as you walk through a door, such as a raised step or threshold.  Trim any bushes or trees on the path to your home.  Use bright outdoor lighting.  Clear any walking paths of anything that might make someone trip, such as rocks or tools.  Regularly check to see if handrails are loose or broken. Make sure that both sides of any steps have handrails.  Any raised decks and porches should have guardrails on the edges.  Have any leaves, snow, or ice cleared  regularly.  Use sand or salt on walking paths during winter.  Clean up any spills in your garage right away. This includes oil or grease spills. What can I do in the bathroom?  Use night lights.  Install grab bars by the toilet and in the tub and shower. Do not use towel bars as grab bars.  Use non-skid mats or decals in the tub or shower.  If you need to sit down in the shower, use a plastic, non-slip stool.  Keep the floor dry. Clean up any water that spills on the floor as soon as it happens.  Remove soap buildup in the tub or shower regularly.  Attach bath mats securely with double-sided non-slip rug tape.  Do not have throw rugs and other things on the floor that can make you trip. What can I do in the bedroom?  Use night lights.  Make sure that you have a light by your bed that is easy to reach.  Do not use any sheets or blankets that are too big for your bed. They should not hang down onto the floor.  Have a firm chair that has side arms. You can use this for support while you get dressed.  Do not have throw rugs  and other things on the floor that can make you trip. What can I do in the kitchen?  Clean up any spills right away.  Avoid walking on wet floors.  Keep items that you use a lot in easy-to-reach places.  If you need to reach something above you, use a strong step stool that has a grab bar.  Keep electrical cords out of the way.  Do not use floor polish or wax that makes floors slippery. If you must use wax, use non-skid floor wax.  Do not have throw rugs and other things on the floor that can make you trip. What can I do with my stairs?  Do not leave any items on the stairs.  Make sure that there are handrails on both sides of the stairs and use them. Fix handrails that are broken or loose. Make sure that handrails are as long as the stairways.  Check any carpeting to make sure that it is firmly attached to the stairs. Fix any carpet that is loose  or worn.  Avoid having throw rugs at the top or bottom of the stairs. If you do have throw rugs, attach them to the floor with carpet tape.  Make sure that you have a light switch at the top of the stairs and the bottom of the stairs. If you do not have them, ask someone to add them for you. What else can I do to help prevent falls?  Wear shoes that:  Do not have high heels.  Have rubber bottoms.  Are comfortable and fit you well.  Are closed at the toe. Do not wear sandals.  If you use a stepladder:  Make sure that it is fully opened. Do not climb a closed stepladder.  Make sure that both sides of the stepladder are locked into place.  Ask someone to hold it for you, if possible.  Clearly mark and make sure that you can see:  Any grab bars or handrails.  First and last steps.  Where the edge of each step is.  Use tools that help you move around (mobility aids) if they are needed. These include:  Canes.  Walkers.  Scooters.  Crutches.  Turn on the lights when you go into a dark area. Replace any light bulbs as soon as they burn out.  Set up your furniture so you have a clear path. Avoid moving your furniture around.  If any of your floors are uneven, fix them.  If there are any pets around you, be aware of where they are.  Review your medicines with your doctor. Some medicines can make you feel dizzy. This can increase your chance of falling. Ask your doctor what other things that you can do to help prevent falls. This information is not intended to replace advice given to you by your health care provider. Make sure you discuss any questions you have with your health care provider. Document Released: 02/25/2009 Document Revised: 10/07/2015 Document Reviewed: 06/05/2014 Elsevier Interactive Patient Education  2017 [employer].

## 2018-01-18 NOTE — Progress Notes (Signed)
Name: Justin Manning   MRN: [id number]    DOB: 12-09-1935   Date:01/18/2018       Progress Note  Subjective  Chief Complaint  Chief Complaint  Patient presents with  . Follow-up    after AWV    HPI  DMII: he used to see Dr. Graceann Congress, and he was seeing Dr. Honor Junes after she left the practice. He has been doing well, he is on Lantus  10  units daily, he has been off Metformin and pre-meal insulin. HgbA1C was down to 6.2% on his last visit He has CKI stage III, needs to follow up with podiatrist ,Dr. Cleda Mccreedy. He has not been as compliant with his diet, eating candy again, but states sugar has still been at goal at home. He also has dyslipidemia and is taking crestor as prescribed.   Dyslipidemia: taking Crestor 40 mg daily and denies side effects, no myalgia. He is due for labs today   HTN: taking bp medication, denies side effects of medications. No chest pain or palpitation. Seeing Dr. Abigail Butts for [facility] and needs some refills today BP is at goal for him because of his age.   Afib: denies any symptoms, no palpitation, no chest pain, he has SOB with mild activity, but chronic and stable. Taking Pradaxa, denies bleeding , he sees Dr. Ubaldo Glassing Last echo showed global hypokinesis, but normal EF. Unchanged   Chronic CHF - Diastolic : uses 2 pillows to sleep - but flat pillows, no edema, denies PND, he has SOB withmildactivity,no wheezing. He sees Dr. Ubaldo Glassing every 6 months. He is compliant with medication  Malnutrition: he lost 20 lbs since 2017, but weight has been stable for the past 6 months, he has been drinking Ensure as a supplement. Lost 3 more pounds since last visit   GERD: under control at this time, he is off Pantoprazole, we will recheck CBC   Atherosclerosis abdominal, carotid: he is on stating therapy, and Pradaxa. BP is at goalUnchanged   History of alcoholism: quit many years ago No problems since  Cognitive dysfunction: he still balances his check book and lives alone,  daughter brings him to office visits and gets his prescription and places on daily pill boxes for him. He is not paranoid, daughter states he is doing good for his age. He does not drive. He does not cook, except for eggs and also uses microwave.    Patient Active Problem List   Diagnosis Date Noted  . History of alcoholism ([place]) 01/05/2017  . Hiatal hernia 04/11/2015  . Emphysema lung ([place]) 04/11/2015  . Protein malnutrition ([place]) 03/18/2015  . Anemia in chronic illness 11/09/2014  . Abdominal aortic atherosclerosis ([place]) 11/09/2014  . A-fib ([place]) 11/09/2014  . Atrial hypertrophy 11/09/2014  . Carotid arterial disease ([place]) 11/09/2014  . Chronic kidney disease (CKD), stage III (moderate) ([place]) 11/09/2014  . Diabetes mellitus with renal manifestations, uncontrolled ([place]) 11/09/2014  . Diastolic dysfunction with heart failure ([place]) 11/09/2014  . Dysfunction of eustachian tube 11/09/2014  . Dyslipidemia associated with type 2 diabetes mellitus ([place]) 11/09/2014  . Essential (primary) hypertension 11/09/2014  . Gastro-esophageal reflux disease without esophagitis 11/09/2014  . Hearing loss of left ear 11/09/2014  . H/O carotid endarterectomy 11/09/2014  . H/O malignant neoplasm of colon 11/09/2014  . H/O infectious disease 11/09/2014  . H/O malignant neoplasm of prostate 11/09/2014  . Lung nodule, solitary 11/09/2014  . Mild cognitive disorder 11/09/2014  . TI (tricuspid incompetence) 11/09/2014    Past Surgical History:  Procedure Laterality Date  . ENDARTERECTOMY Left    Carotid  . KNEE SURGERY Right   . PARTIAL COLECTOMY     with Anastomosis  . SHOULDER SURGERY Right     Family History  Problem Relation Age of Onset  . Alcohol abuse Brother   . Diabetes Daughter     Social History   Socioeconomic History  . Marital status: Widowed    Spouse name: Janett Billow  . Number of children: 3  . Years of education: Not on file  . Highest education level: 11th grade   Occupational History  . Occupation: retired  Scientific laboratory technician  . Financial resource strain: Not hard at all  . Food insecurity:    Worry: Never true    Inability: Never true  . Transportation needs:    Medical: No    Non-medical: No  Tobacco Use  . Smoking status: Former Smoker    Packs/day: 1.00    Years: 10.00    Pack years: 10.00    Types: Cigarettes    Last attempt to quit: 05/15/1988    Years since quitting: 29.6  . Smokeless tobacco: Never Used  . Tobacco comment: smoking cessation materials not required  Substance and Sexual Activity  . Alcohol use: No    Alcohol/week: 0.0 standard drinks    Comment: used to be a heavy drinker but quit 40 years ago   . Drug use: Yes    Types: Marijuana    Comment: many years ago   . Sexual activity: Not Currently  Lifestyle  . Physical activity:    Days per week: 0 days    Minutes per session: 0 min  . Stress: Not at all  Relationships  . Social connections:    Talks on phone: Patient refused    Gets together: Patient refused    Attends religious service: Patient refused    Active member of club or organization: Patient refused    Attends meetings of clubs or organizations: Patient refused    Relationship status: Patient refused  . Intimate partner violence:    Fear of current or ex partner: No    Emotionally abused: No    Physically abused: No    Forced sexual activity: No  Other Topics Concern  . Not on file  Social History Narrative   He lives alone, but his youngest son moved in with him in 2017   He states that his son does not want to work, and is stressing him out.      Current Outpatient Medications:  .  Cyanocobalamin (VITAMIN B 12) 100 MCG LOZG, Take 100 mcg by mouth daily. , Disp: , Rfl:  .  dabigatran (PRADAXA) 75 MG CAPS capsule, Take 2 tablets by mouth 2 (two) times daily., Disp: , Rfl:  .  diltiazem (CARTIA XT) 240 MG 24 hr capsule, Take 1 capsule by mouth daily., Disp: , Rfl:  .  glucose blood (FREESTYLE  LITE) test strip, TEST BLOOD SUGAR THREE TIMES DAILY AS DIRECTED, Disp: 300 each, Rfl: 2 .  hydrALAZINE (APRESOLINE) 50 MG tablet, Take 1 tablet by mouth 4 (four) times daily., Disp: , Rfl:  .  hydrochlorothiazide (HYDRODIURIL) 25 MG tablet, Take it daily, Disp: 90 tablet, Rfl: 1 .  Insulin Glargine (LANTUS SOLOSTAR) 100 UNIT/ML Solostar Pen, Inject 10 Units into the skin daily., Disp: 15 mL, Rfl: 1 .  Insulin Pen Needle (B-D ULTRAFINE III SHORT PEN) 31G X 8 MM MISC, Daily with lantus, Disp: 100 each, Rfl: 3 .  lisinopril (PRINIVIL,ZESTRIL) 20 MG tablet, TAKE 1 TABLET(20 MG) BY MOUTH TWICE DAILY, Disp: 180 tablet, Rfl: 1 .  rosuvastatin (CRESTOR) 40 MG tablet, Take one daily, Disp: 90 tablet, Rfl: 0  Allergies  Allergen Reactions  . Aspirin     tachycardia     ROS  Constitutional: Negative for fever or significant weight change.  Respiratory: Positive  For intermittent  cough but no  shortness of breath.   Cardiovascular: Negative for chest pain or  palpitations.  Gastrointestinal: Negative for abdominal pain, no bowel changes.  Musculoskeletal: positive  for gait problem but no  joint swelling.  Skin: Negative for rash.  Neurological: Negative for dizziness , positive for  headache.  No other specific complaints in a complete review of systems (except as listed in HPI above).  Objective  Vitals:   01/18/18 1431  BP: (!) 144/68  Pulse: 73  Resp: 12  Temp: 97.6 F (36.4 C)  TempSrc: Oral  SpO2: 94%  Weight: 169 lb 4.8 oz (76.8 kg)  Height: 5\' 8"  (1.727 m)    Body mass index is 25.74 kg/m.  Physical Exam  Constitutional: Patient appears well-developed and well-nourished.  No distress.  HEENT: head atraumatic, normocephalic, pupils equal and reactive to light,  neck supple, throat within normal limits Cardiovascular: Normal rate, regular rhythm and normal heart sounds.  No murmur heard. No BLE edema. Pulmonary/Chest: Effort normal and breath sounds: scattered rhonchi  No  respiratory distress. Abdominal: Soft.  There is no tenderness. Psychiatric: Patient has a normal mood and affect. behavior is normal. Judgment and thought content normal.  Diabetic Foot Exam: Diabetic Foot Exam - Simple   Simple Foot Form Diabetic Foot exam was performed with the following findings:  Yes 01/18/2018  3:06 PM  Visual Inspection See comments:  Yes Sensation Testing Intact to touch and monofilament testing bilaterally:  Yes Pulse Check See comments:  Yes Comments Thick and long toenails, some dark moles on his feet and dry skin, also distal pulses weak, he needs to follow up with podiatrist, daughter is going to schedule visit       PHQ2/9: Depression screen [facility] 2/9 01/18/2018 07/13/2017 01/05/2017 07/07/2016 01/07/2016  Decreased Interest 0 0 0 0 0  Down, Depressed, Hopeless 0 0 0 0 0  PHQ - 2 Score 0 0 0 0 0  Altered sleeping 0 - - - -  Tired, decreased energy 0 - - - -  Change in appetite 0 - - - -  Feeling bad or failure about yourself  0 - - - -  Trouble concentrating 0 - - - -  Moving slowly or fidgety/restless 0 - - - -  Suicidal thoughts 0 - - - -  PHQ-9 Score 0 - - - -  Difficult doing work/chores Not difficult at all - - - -     Fall Risk: Fall Risk  01/18/2018 07/13/2017 01/05/2017 07/07/2016 01/07/2016  Falls in the past year? No No No No No  Risk for fall due to : Impaired vision;Impaired balance/gait - - - -  Risk for fall due to: Comment wears eyeglasses; ambulates with cane - - - -      Assessment & Plan  1. Controlled type 2 diabetes mellitus with stage 3 chronic kidney disease, with long-term current use of insulin (HCC)  - Insulin Glargine (LANTUS SOLOSTAR) 100 UNIT/ML Solostar Pen; Inject 10 Units into the skin daily.  Dispense: 15 mL; Refill: 1 - NEEDLE, DISP, 31 G (BD DISP NEEDLES) 31G  X 1" MISC; 1 Units by Does not apply route daily.  Dispense: 100 each; Refill: 2  2. History of alcoholism ([place])   3. Chronic diastolic heart failure  (HCC)  - lisinopril (PRINIVIL,ZESTRIL) 20 MG tablet; TAKE 1 TABLET(20 MG) BY MOUTH TWICE DAILY  Dispense: 180 tablet; Refill: 1  4. Abdominal aortic atherosclerosis ([place])  On statin therapy and pradaxa  5. Paroxysmal atrial fibrillation (HCC)  On pradaxa and rate is controlled with diltiazem  6. Dyslipidemia associated with type 2 diabetes mellitus (HCC)  - Hemoglobin A1c - Lipid panel  7. Chronic kidney disease, stage 3 (HCC)  - COMPLETE METABOLIC PANEL WITH GFR - VITAMIN D 25 Hydroxy (Vit-D Deficiency, Fractures) - Urine Microalbumin w/creat. ratio  8. Needs flu shot  - Flu vaccine HIGH DOSE PF  9. Anemia, unspecified type  - CBC with Differential/Platelet  10. Cognitive dysfunction  Refuses medication   11. Pulmonary emphysema, unspecified emphysema type ([place])  He quit smoking a long time ago, has rhonchi and intermittent cough, but refuses spirometry or medication at this time  12. Essential (primary) hypertension  - hydrochlorothiazide (HYDRODIURIL) 25 MG tablet; Take it daily  Dispense: 90 tablet; Refill: 1 - lisinopril (PRINIVIL,ZESTRIL) 20 MG tablet; TAKE 1 TABLET(20 MG) BY MOUTH TWICE DAILY  Dispense: 180 tablet; Refill: 1  13. Dyslipidemia  - rosuvastatin (CRESTOR) 40 MG tablet; Take one daily  Dispense: 90 tablet; Refill: 0

## 2018-01-19 LAB — COMPLETE METABOLIC PANEL WITH GFR
AG Ratio: 1.4 (calc) (ref 1.0–2.5)
ALBUMIN MSPROF: 4.1 g/dL (ref 3.6–5.1)
ALT: 15 U/L (ref 9–46)
AST: 19 U/L (ref 10–35)
Alkaline phosphatase (APISO): 51 U/L (ref 40–115)
BUN / CREAT RATIO: 24 (calc) — AB (ref 6–22)
BUN: 38 mg/dL — ABNORMAL HIGH (ref 7–25)
CALCIUM: 9.9 mg/dL (ref 8.6–10.3)
CO2: 27 mmol/L (ref 20–32)
CREATININE: 1.58 mg/dL — AB (ref 0.70–1.11)
Chloride: 103 mmol/L (ref 98–110)
GFR, EST AFRICAN AMERICAN: 47 mL/min/{1.73_m2} — AB (ref >=60)
GFR, EST NON AFRICAN AMERICAN: 41 mL/min/{1.73_m2} — AB (ref >=60)
GLOBULIN: 3 g/dL (ref 1.9–3.7)
Glucose, Bld: 124 mg/dL (ref 65–139)
Potassium: 3.7 mmol/L (ref 3.5–5.3)
SODIUM: 140 mmol/L (ref 135–146)
Total Bilirubin: 0.6 mg/dL (ref 0.2–1.2)
Total Protein: 7.1 g/dL (ref 6.1–8.1)

## 2018-01-19 LAB — HEMOGLOBIN A1C
Hgb A1c MFr Bld: 7.4 % of total Hgb — ABNORMAL HIGH (ref <5.7)
Mean Plasma Glucose: 166 (calc)
eAG (mmol/L): 9.2 (calc)

## 2018-01-19 LAB — CBC WITH DIFFERENTIAL/PLATELET
BASOS PCT: 0.5 %
Basophils Absolute: 45 cells/uL (ref 0–200)
EOS ABS: 71 {cells}/uL (ref 15–500)
Eosinophils Relative: 0.8 %
HCT: 41.5 % (ref 38.5–50.0)
HEMOGLOBIN: 14.1 g/dL (ref 13.2–17.1)
Lymphs Abs: 1219 cells/uL (ref 850–3900)
MCH: 31.4 pg (ref 27.0–33.0)
MCHC: 34 g/dL (ref 32.0–36.0)
MCV: 92.4 fL (ref 80.0–100.0)
MPV: 11.5 fL (ref 7.5–12.5)
Monocytes Relative: 7.5 %
NEUTROS ABS: 6898 {cells}/uL (ref 1500–7800)
Neutrophils Relative %: 77.5 %
PLATELETS: 204 10*3/uL (ref 140–400)
RBC: 4.49 10*6/uL (ref 4.20–5.80)
RDW: 13.6 % (ref 11.0–15.0)
TOTAL LYMPHOCYTE: 13.7 %
WBC: 8.9 10*3/uL (ref 3.8–10.8)
WBCMIX: 668 {cells}/uL (ref 200–950)

## 2018-01-19 LAB — LIPID PANEL
CHOL/HDL RATIO: 2.6 (calc) (ref <5.0)
CHOLESTEROL: 133 mg/dL (ref <200)
HDL: 52 mg/dL (ref >40)
LDL CHOLESTEROL (CALC): 62 mg/dL
Non-HDL Cholesterol (Calc): 81 mg/dL (calc) (ref <130)
Triglycerides: 106 mg/dL (ref <150)

## 2018-01-19 LAB — MICROALBUMIN / CREATININE URINE RATIO
Creatinine, Urine: 94 mg/dL (ref 20–320)
Microalb Creat Ratio: 97 mcg/mg creat — ABNORMAL HIGH (ref <30)
Microalb, Ur: 9.1 mg/dL

## 2018-01-19 LAB — VITAMIN D 25 HYDROXY (VIT D DEFICIENCY, FRACTURES): Vit D, 25-Hydroxy: 42 ng/mL (ref 30–100)

## 2018-02-01 DIAGNOSIS — B351 Tinea unguium: Secondary | ICD-10-CM | POA: Diagnosis not present

## 2018-02-01 DIAGNOSIS — E119 Type 2 diabetes mellitus without complications: Secondary | ICD-10-CM | POA: Diagnosis not present

## 2018-02-01 DIAGNOSIS — L851 Acquired keratosis [keratoderma] palmaris et plantaris: Secondary | ICD-10-CM | POA: Diagnosis not present

## 2018-03-27 DIAGNOSIS — I129 Hypertensive chronic kidney disease with stage 1 through stage 4 chronic kidney disease, or unspecified chronic kidney disease: Secondary | ICD-10-CM | POA: Diagnosis not present

## 2018-03-27 DIAGNOSIS — R809 Proteinuria, unspecified: Secondary | ICD-10-CM | POA: Diagnosis not present

## 2018-03-27 DIAGNOSIS — N183 Chronic kidney disease, stage 3 (moderate): Secondary | ICD-10-CM | POA: Diagnosis not present

## 2018-03-27 DIAGNOSIS — E1122 Type 2 diabetes mellitus with diabetic chronic kidney disease: Secondary | ICD-10-CM | POA: Diagnosis not present

## 2018-04-20 ENCOUNTER — Other Ambulatory Visit: Payer: Self-pay | Admitting: Family Medicine

## 2018-04-20 DIAGNOSIS — E785 Hyperlipidemia, unspecified: Secondary | ICD-10-CM

## 2018-07-18 ENCOUNTER — Other Ambulatory Visit: Payer: Self-pay | Admitting: Family Medicine

## 2018-07-18 DIAGNOSIS — E785 Hyperlipidemia, unspecified: Secondary | ICD-10-CM

## 2018-07-19 NOTE — Telephone Encounter (Signed)
Refill Request for Cholesterol medication. Crestor 40 mg  Last physical: 01/18/2018  Lab Results  Component Value Date   CHOL 133 01/18/2018   HDL 52 01/18/2018   LDLCALC 62 01/18/2018   TRIG 106 01/18/2018   CHOLHDL 2.6 01/18/2018    Follow up:  07/26/2018

## 2018-07-26 ENCOUNTER — Ambulatory Visit: Payer: Medicare Other | Admitting: Family Medicine

## 2018-08-20 ENCOUNTER — Other Ambulatory Visit: Payer: Self-pay | Admitting: Family Medicine

## 2018-08-20 DIAGNOSIS — I10 Essential (primary) hypertension: Secondary | ICD-10-CM

## 2018-08-21 ENCOUNTER — Other Ambulatory Visit: Payer: Self-pay | Admitting: Family Medicine

## 2018-08-21 DIAGNOSIS — I10 Essential (primary) hypertension: Secondary | ICD-10-CM

## 2018-08-21 NOTE — Telephone Encounter (Signed)
Pt is scheduled for Friday 08/23/18 @ 1:40 pm. Pt needs to have this through telephone call.

## 2018-08-22 NOTE — Telephone Encounter (Signed)
Pt is on the schedule for tomorrow for telephone visit

## 2018-08-23 ENCOUNTER — Other Ambulatory Visit: Payer: Self-pay

## 2018-08-23 ENCOUNTER — Ambulatory Visit (INDEPENDENT_AMBULATORY_CARE_PROVIDER_SITE_OTHER): Payer: Medicare Other | Admitting: Family Medicine

## 2018-08-23 ENCOUNTER — Encounter: Payer: Self-pay | Admitting: Family Medicine

## 2018-08-23 VITALS — BP 138/88 | HR 76 | Temp 98.1°F | Resp 16 | Ht 68.0 in | Wt 167.2 lb

## 2018-08-23 DIAGNOSIS — E46 Unspecified protein-calorie malnutrition: Secondary | ICD-10-CM

## 2018-08-23 DIAGNOSIS — F1021 Alcohol dependence, in remission: Secondary | ICD-10-CM | POA: Diagnosis not present

## 2018-08-23 DIAGNOSIS — N2581 Secondary hyperparathyroidism of renal origin: Secondary | ICD-10-CM

## 2018-08-23 DIAGNOSIS — I700 Atherosclerosis of aorta: Secondary | ICD-10-CM

## 2018-08-23 DIAGNOSIS — N183 Chronic kidney disease, stage 3 (moderate): Secondary | ICD-10-CM

## 2018-08-23 DIAGNOSIS — E785 Hyperlipidemia, unspecified: Secondary | ICD-10-CM

## 2018-08-23 DIAGNOSIS — E1122 Type 2 diabetes mellitus with diabetic chronic kidney disease: Secondary | ICD-10-CM | POA: Diagnosis not present

## 2018-08-23 DIAGNOSIS — IMO0001 Reserved for inherently not codable concepts without codable children: Secondary | ICD-10-CM

## 2018-08-23 DIAGNOSIS — J439 Emphysema, unspecified: Secondary | ICD-10-CM

## 2018-08-23 DIAGNOSIS — Z794 Long term (current) use of insulin: Secondary | ICD-10-CM | POA: Diagnosis not present

## 2018-08-23 DIAGNOSIS — I480 Paroxysmal atrial fibrillation: Secondary | ICD-10-CM

## 2018-08-23 DIAGNOSIS — I5032 Chronic diastolic (congestive) heart failure: Secondary | ICD-10-CM

## 2018-08-23 DIAGNOSIS — I129 Hypertensive chronic kidney disease with stage 1 through stage 4 chronic kidney disease, or unspecified chronic kidney disease: Secondary | ICD-10-CM | POA: Diagnosis not present

## 2018-08-23 DIAGNOSIS — I10 Essential (primary) hypertension: Secondary | ICD-10-CM

## 2018-08-23 DIAGNOSIS — R410 Disorientation, unspecified: Secondary | ICD-10-CM

## 2018-08-23 LAB — POCT URINALYSIS DIPSTICK
Bilirubin, UA: NEGATIVE
Blood, UA: NEGATIVE
Glucose, UA: NEGATIVE
Ketones, UA: NEGATIVE
Nitrite, UA: NEGATIVE
Protein, UA: POSITIVE — AB
Spec Grav, UA: 1.01 (ref 1.010–1.025)
Urobilinogen, UA: 0.2 E.U./dL
pH, UA: 6 (ref 5.0–8.0)

## 2018-08-23 LAB — POCT GLYCOSYLATED HEMOGLOBIN (HGB A1C): HbA1c, POC (controlled diabetic range): 6.4 % (ref 0.0–7.0)

## 2018-08-23 MED ORDER — HYDROCHLOROTHIAZIDE 25 MG PO TABS: ORAL_TABLET | ORAL | 1 refills | Status: DC

## 2018-08-23 MED ORDER — INSULIN GLARGINE 100 UNIT/ML SOLOSTAR PEN: 8.0000 [IU] | PEN_INJECTOR | Freq: Every day | SUBCUTANEOUS | 1 refills | Status: DC

## 2018-08-23 MED ORDER — ROSUVASTATIN CALCIUM 40 MG PO TABS: ORAL_TABLET | ORAL | 0 refills | Status: DC

## 2018-08-23 MED ORDER — LISINOPRIL 20 MG PO TABS: ORAL_TABLET | ORAL | 1 refills | Status: DC

## 2018-08-23 NOTE — Progress Notes (Addendum)
Name: Justin Manning   MRN: [id number]    DOB: [birth year]   Date:08/23/2018       Progress Note  Subjective  Chief Complaint  Chief Complaint  Patient presents with  . Medication Refill  . Diabetes  . Dyslipidemia  . Hypertension  . Gastroesophageal Reflux  . Cognitive dysfunction    Daughter states symptoms are gradually getting worst  . Chronic CHF    HPI  DMII: heused to see Dr. Graceann Congress, and he was seeing Dr. Honor Junes after she left the practice. He has been doing well, he is on Lantus  10units daily, he has been off Metformin and pre-meal insulin last  HgbA1C was 7.4% but today is down to 6.4%, discussed going down to 8 units daily because of his age. Daughter also has noticed episodes of confusion and advised to check glucose to make sure not secondary to hypoglycemia. He has CKI stage III and is on ACE   Dyslipidemia: taking Crestor 40 mg daily and denies side effects, no myalgia.Reviewed labs   HTN: taking bp medication, denies side effects of medications. No chest pain or palpitation. Seeing Dr. Abigail Butts for [facility] , sending refills of ACE and HCTZ today, he gest other medications from Dr. Abigail Butts   Afib: denies any symptoms, no palpitation, no chest pain, he has SOB with mild activity, but chronic and stable. Taking Pradaxa, denies bleeding.  He sees Dr. Ubaldo Glassing Last echo showed global hypokinesis, but normal EF.  Chronic CHF - Diastolic : uses 2 pillows to sleep- but flat pillows, no edema, denies PND, he has SOB withmildactivity like showering,no wheezing. He sees Dr. Ubaldo Glassing every 6 months. He is compliant with medication  Malnutrition: helost 20 lbs since 2017, but weight has been stable for the past 12  months, he has been drinking Ensure four times a day but skipping meals, explained that he needs to drink supplements between meals.   Emphysema: he has an occasionally cough, he quit many years ago, has sob with activity but can be multifactorial. No wheezing  , he does not want medications for that   Atherosclerosis abdominal, carotid: he is on stating therapy, and Pradaxa. No side effects doing well.   History of alcoholism: quit many years agoNo problems since. Unchanged   Cognitive dysfunction: he still balances his check book and lives alone, daughter brings him to office visits and gets his prescription and places on daily pill boxes for him. He is not paranoid, daughter states he is doing good for his age. He does not drive. He states usually uses microwave. Daughter states that a couple of days ago he was demanding, and not his normal self, however it resolved and he remembered the following day, nothing since, advised to check glucose if it happens again   Patient Active Problem List   Diagnosis Date Noted  . History of alcoholism ([place]) 01/05/2017  . Hiatal hernia 04/11/2015  . Emphysema lung ([place]) 04/11/2015  . Protein malnutrition ([place]) 03/18/2015  . Anemia in chronic illness 11/09/2014  . Abdominal aortic atherosclerosis ([place]) 11/09/2014  . A-fib ([place]) 11/09/2014  . Atrial hypertrophy 11/09/2014  . Carotid arterial disease ([place]) 11/09/2014  . Chronic kidney disease (CKD), stage III (moderate) ([place]) 11/09/2014  . Diabetes mellitus with renal manifestations, uncontrolled ([place]) 11/09/2014  . Diastolic dysfunction with heart failure ([place]) 11/09/2014  . Dysfunction of eustachian tube 11/09/2014  . Dyslipidemia associated with type 2 diabetes mellitus ([place]) 11/09/2014  . Essential (primary) hypertension 11/09/2014  . Gastro-esophageal  reflux disease without esophagitis 11/09/2014  . Hearing loss of left ear 11/09/2014  . H/O carotid endarterectomy 11/09/2014  . H/O malignant neoplasm of colon 11/09/2014  . H/O infectious disease 11/09/2014  . H/O malignant neoplasm of prostate 11/09/2014  . Lung nodule, solitary 11/09/2014  . Mild cognitive disorder 11/09/2014  . TI (tricuspid incompetence) 11/09/2014    Past Surgical  History:  Procedure Laterality Date  . ENDARTERECTOMY Left    Carotid  . KNEE SURGERY Right   . PARTIAL COLECTOMY     with Anastomosis  . SHOULDER SURGERY Right     Family History  Problem Relation Age of Onset  . Alcohol abuse Brother   . Diabetes Daughter     Social History   Socioeconomic History  . Marital status: Widowed    Spouse name: Janett Billow  . Number of children: 3  . Years of education: Not on file  . Highest education level: 11th grade  Occupational History  . Occupation: retired  Scientific laboratory technician  . Financial resource strain: Not hard at all  . Food insecurity:    Worry: Never true    Inability: Never true  . Transportation needs:    Medical: No    Non-medical: No  Tobacco Use  . Smoking status: Former Smoker    Packs/day: 1.00    Years: 10.00    Pack years: 10.00    Types: Cigarettes    Last attempt to quit: 05/15/1988    Years since quitting: 30.2  . Smokeless tobacco: Never Used  . Tobacco comment: smoking cessation materials not required  Substance and Sexual Activity  . Alcohol use: No    Alcohol/week: 0.0 standard drinks    Comment: used to be a heavy drinker but quit 40 years ago   . Drug use: Yes    Types: Marijuana    Comment: many years ago   . Sexual activity: Not Currently  Lifestyle  . Physical activity:    Days per week: 0 days    Minutes per session: 0 min  . Stress: Not at all  Relationships  . Social connections:    Talks on phone: Patient refused    Gets together: Patient refused    Attends religious service: Patient refused    Active member of club or organization: Patient refused    Attends meetings of clubs or organizations: Patient refused    Relationship status: Patient refused  . Intimate partner violence:    Fear of current or ex partner: No    Emotionally abused: No    Physically abused: No    Forced sexual activity: No  Other Topics Concern  . Not on file  Social History Narrative   He lives alone      Current Outpatient Medications:  .  Cyanocobalamin (VITAMIN B 12) 100 MCG LOZG, Take 100 mcg by mouth daily. , Disp: , Rfl:  .  dabigatran (PRADAXA) 75 MG CAPS capsule, Take 2 tablets by mouth 2 (two) times daily., Disp: , Rfl:  .  diltiazem (CARTIA XT) 240 MG 24 hr capsule, Take 1 capsule by mouth daily., Disp: , Rfl:  .  glucose blood (FREESTYLE LITE) test strip, TEST BLOOD SUGAR THREE TIMES DAILY AS DIRECTED, Disp: 300 each, Rfl: 2 .  hydrALAZINE (APRESOLINE) 50 MG tablet, Take 1 tablet by mouth 4 (four) times daily., Disp: , Rfl:  .  hydrochlorothiazide (HYDRODIURIL) 25 MG tablet, Take it daily, Disp: 90 tablet, Rfl: 1 .  Insulin Glargine (LANTUS SOLOSTAR) 100  UNIT/ML Solostar Pen, Inject 10 Units into the skin daily., Disp: 15 mL, Rfl: 1 .  Insulin Pen Needle (B-D ULTRAFINE III SHORT PEN) 31G X 8 MM MISC, Daily with lantus, Disp: 100 each, Rfl: 3 .  lisinopril (PRINIVIL,ZESTRIL) 20 MG tablet, TAKE 1 TABLET(20 MG) BY MOUTH TWICE DAILY, Disp: 180 tablet, Rfl: 1 .  rosuvastatin (CRESTOR) 40 MG tablet, TAKE 1 TABLET BY MOUTH DAILY, Disp: 90 tablet, Rfl: 0  Allergies  Allergen Reactions  . Aspirin     tachycardia    I personally reviewed active problem list, medication list, allergies, family history, social history with the patient/caregiver today.   ROS  Constitutional: Negative for fever,  Weight is stable  Respiratory: positive  for occasional dry cough and shortness of breath with mild activity such showering or doing laundry Cardiovascular: Negative for chest pain or palpitations.  Gastrointestinal: Negative for abdominal pain, no bowel changes.  Musculoskeletal:positive for gait problem and mild right  joint swelling.  Skin: Negative for rash.  Neurological: Negative for dizziness or headache.  No other specific complaints in a complete review of syptoms (except as listed in HPI above).  Objective  Vitals:   08/23/18 1331  BP: 138/88  Pulse: 76  Resp: 16  Temp:  98.1 F (36.7 C)  TempSrc: Oral  SpO2: 97%  Weight: 167 lb 3.2 oz (75.8 kg)  Height: 5\' 8"  (1.727 m)    Body mass index is 25.42 kg/m.  Physical Exam  Constitutional: Patient is wearing clothes that is too big from him, he has temporal waisting.  HEENT: head atraumatic, normocephalic, pupils equal and reactive to light,neck supple Cardiovascular: Normal rate, regular rhythm and normal heart sounds.  No murmur heard. No BLE edema. Pulmonary/Chest: Effort normal and breath sounds normal. No respiratory distress. Abdominal: Soft.  There is no tenderness. Muscular skeletal: mild effusion right knee  Psychiatric: Patient has a normal mood and affect. behavior is normal. Judgment and thought content normal.  Recent Results (from the past 2160 hour(s))  POCT HgB A1C     Status: Normal   Collection Time: 08/23/18  1:39 PM  Result Value Ref Range   Hemoglobin A1C     HbA1c POC (<> result, manual entry)     HbA1c, POC (prediabetic range)     HbA1c, POC (controlled diabetic range) 6.4 0.0 - 7.0 %     PHQ2/9: Depression screen [facility] 2/9 08/23/2018 01/18/2018 07/13/2017 01/05/2017 07/07/2016  Decreased Interest 1 0 0 0 0  Down, Depressed, Hopeless 0 0 0 0 0  PHQ - 2 Score 1 0 0 0 0  Altered sleeping 0 0 - - -  Tired, decreased energy 0 0 - - -  Change in appetite 1 0 - - -  Feeling bad or failure about yourself  0 0 - - -  Trouble concentrating 1 0 - - -  Moving slowly or fidgety/restless 0 0 - - -  Suicidal thoughts 0 0 - - -  PHQ-9 Score 3 0 - - -  Difficult doing work/chores Not difficult at all Not difficult at all - - -    phq 9 is negative   Fall Risk: Fall Risk  08/23/2018 01/18/2018 07/13/2017 01/05/2017 07/07/2016  Falls in the past year? 0 No No No No  Number falls in past yr: 0 - - - -  Injury with Fall? 0 - - - -  Risk for fall due to : - Impaired vision;Impaired balance/gait - - -  Risk for  fall due to: Comment - wears eyeglasses; ambulates with cane - - -     Functional  Status Survey: Is the patient deaf or have difficulty hearing?: Yes Does the patient have difficulty seeing, even when wearing glasses/contacts?: Yes Does the patient have difficulty concentrating, remembering, or making decisions?: Yes Does the patient have difficulty walking or climbing stairs?: No Does the patient have difficulty dressing or bathing?: No Does the patient have difficulty doing errands alone such as visiting a doctor's office or shopping?: Yes   Assessment & Plan   1. Controlled type 2 diabetes mellitus with stage 3 chronic kidney disease, with long-term current use of insulin (HCC)  - POCT HgB A1C - Insulin Glargine (LANTUS SOLOSTAR) 100 UNIT/ML Solostar Pen; Inject 8 Units into the skin daily.  Dispense: 15 mL; Refill: 1  2. Protein malnutrition ([place])  Discussed a more balanced diet and stop diet sodas  3. History of alcoholism ([place])  In remission   4. Chronic diastolic heart failure (HCC)  - lisinopril (PRINIVIL,ZESTRIL) 20 MG tablet; TAKE 1 TABLET(20 MG) BY MOUTH TWICE DAILY  Dispense: 180 tablet; Refill: 1  5. Pulmonary emphysema, unspecified emphysema type (HCC)  Stable intermittent cough and SOB with activity   6. Abdominal aortic atherosclerosis ([place])  On statin therapy and pradaxa   7. Paroxysmal atrial fibrillation (HCC)  No palpitation   8. Type 2 diabetes mellitus with chronic kidney disease and hypertension (HCC)  - lisinopril (PRINIVIL,ZESTRIL) 20 MG tablet; TAKE 1 TABLET(20 MG) BY MOUTH TWICE DAILY  Dispense: 180 tablet; Refill: 1 - hydrochlorothiazide (HYDRODIURIL) 25 MG tablet; Take it daily  Dispense: 90 tablet; Refill: 1  9. Essential (primary) hypertension  - lisinopril (PRINIVIL,ZESTRIL) 20 MG tablet; TAKE 1 TABLET(20 MG) BY MOUTH TWICE DAILY  Dispense: 180 tablet; Refill: 1 - hydrochlorothiazide (HYDRODIURIL) 25 MG tablet; Take it daily  Dispense: 90 tablet; Refill: 1  10. Dyslipidemia  - rosuvastatin (CRESTOR) 40 MG tablet;  TAKE 1 TABLET BY MOUTH DAILY  Dispense: 90 tablet; Refill: 0  11. Secondary hyperparathyroidism of renal origin ([place])

## 2018-08-26 ENCOUNTER — Telehealth: Payer: Self-pay | Admitting: Family Medicine

## 2018-08-26 NOTE — Telephone Encounter (Addendum)
Copied from [place] [id number]. Topic: Quick Communication - See Telephone Encounter >> Aug 26, 2018  1:16 PM Ivar Drape wrote: CRM for notification. See Telephone encounter for: 08/26/18. Patient's daughter, Wenceslaus Gist [phone], wanted the provider to know that the patient has been giving himself the Insulin Glargine (LANTUS SOLOSTAR) 100 UNIT/ML Solostar Pen medication through his pants so he has wasted most of the prescription.  She went to the pharmacy to ask for a refill but she was the told the insurance company would not pay for it, so the price would be $400.  They cannot afford $400 so they want to know if the practice has any samples or discount cards for this medication.  The daughter also stated that in the future she would keep the medication with her and take it over to him daily to administer in her presence.

## 2018-09-05 ENCOUNTER — Encounter: Payer: Self-pay | Admitting: Family Medicine

## 2018-09-23 DIAGNOSIS — N183 Chronic kidney disease, stage 3 (moderate): Secondary | ICD-10-CM | POA: Diagnosis not present

## 2018-09-23 DIAGNOSIS — E1122 Type 2 diabetes mellitus with diabetic chronic kidney disease: Secondary | ICD-10-CM | POA: Diagnosis not present

## 2018-09-23 DIAGNOSIS — R809 Proteinuria, unspecified: Secondary | ICD-10-CM | POA: Diagnosis not present

## 2018-09-23 DIAGNOSIS — N2581 Secondary hyperparathyroidism of renal origin: Secondary | ICD-10-CM | POA: Diagnosis not present

## 2018-09-23 DIAGNOSIS — D631 Anemia in chronic kidney disease: Secondary | ICD-10-CM | POA: Diagnosis not present

## 2018-09-23 DIAGNOSIS — I10 Essential (primary) hypertension: Secondary | ICD-10-CM | POA: Diagnosis not present

## 2018-09-23 DIAGNOSIS — I129 Hypertensive chronic kidney disease with stage 1 through stage 4 chronic kidney disease, or unspecified chronic kidney disease: Secondary | ICD-10-CM | POA: Diagnosis not present

## 2018-09-23 LAB — BASIC METABOLIC PANEL
BUN: 22 — AB (ref 4–21)
Creatinine: 1.5 — AB (ref 0.6–1.3)
Glucose: 107
Potassium: 3.5 (ref 3.4–5.3)
Sodium: 142 (ref 137–147)

## 2018-10-01 ENCOUNTER — Encounter: Payer: Self-pay | Admitting: Family Medicine

## 2018-10-08 ENCOUNTER — Encounter: Payer: Self-pay | Admitting: Family Medicine

## 2018-10-30 ENCOUNTER — Telehealth: Payer: Self-pay

## 2018-10-30 NOTE — Telephone Encounter (Signed)
Copied from [place] [id number]. Topic: General - Inquiry >> Oct 30, 2018  9:23 AM Virl Axe D wrote: Reason for CRM: Pt's daughter [name] stated that pt had an episode the other day where he ended up calling the police and telling them he did not know where he was. [name] would like to speak with Dr. Ancil Boozer as soon as possible regarding either getting home health assistance for pt or other alternatives. Please advise. HY#[phone]

## 2018-10-30 NOTE — Telephone Encounter (Signed)
This is not the first time this has happened. Daughter thinks that it is early stage of Dementia or maybe UTI. Daughter states that he couldn't find her number so he called 911 stating that he didn't know where he was. Is there medication that he can take to slow down they symptoms of dementia if that is the case.

## 2018-10-30 NOTE — Telephone Encounter (Signed)
Patient's daughter, [name], calling and is very addiment about speaking to someone in the office. States that she received a phone call while on lunch, but was unable to answer. Please advise.

## 2018-10-31 ENCOUNTER — Ambulatory Visit (INDEPENDENT_AMBULATORY_CARE_PROVIDER_SITE_OTHER): Payer: Medicare Other | Admitting: Family Medicine

## 2018-10-31 ENCOUNTER — Other Ambulatory Visit: Payer: Self-pay

## 2018-10-31 ENCOUNTER — Encounter: Payer: Self-pay | Admitting: Family Medicine

## 2018-10-31 VITALS — BP 142/84 | HR 70 | Temp 98.4°F | Resp 16 | Ht 68.0 in | Wt 170.3 lb

## 2018-10-31 DIAGNOSIS — E1165 Type 2 diabetes mellitus with hyperglycemia: Secondary | ICD-10-CM

## 2018-10-31 DIAGNOSIS — R410 Disorientation, unspecified: Secondary | ICD-10-CM

## 2018-10-31 DIAGNOSIS — IMO0002 Reserved for concepts with insufficient information to code with codable children: Secondary | ICD-10-CM

## 2018-10-31 DIAGNOSIS — F0390 Unspecified dementia without behavioral disturbance: Secondary | ICD-10-CM

## 2018-10-31 DIAGNOSIS — E1129 Type 2 diabetes mellitus with other diabetic kidney complication: Secondary | ICD-10-CM | POA: Diagnosis not present

## 2018-10-31 LAB — POCT URINALYSIS DIPSTICK
Appearance: NORMAL
Bilirubin, UA: NEGATIVE
Blood, UA: NEGATIVE
Glucose, UA: NEGATIVE
Ketones, UA: NEGATIVE
Leukocytes, UA: NEGATIVE
Nitrite, UA: NEGATIVE
Odor: NORMAL
Protein, UA: POSITIVE — AB
Spec Grav, UA: 1.015 (ref 1.010–1.025)
Urobilinogen, UA: 0.2 E.U./dL
pH, UA: 7 (ref 5.0–8.0)

## 2018-10-31 MED ORDER — DONEPEZIL HCL 10 MG PO TABS: 10.0000 mg | ORAL_TABLET | Freq: Every day | ORAL | 0 refills | Status: DC

## 2018-10-31 NOTE — Progress Notes (Signed)
Name: Justin Manning   MRN: [id number]    DOB: 06-Oct-1935   Date:10/31/2018       Progress Note  Subjective  Chief Complaint  Chief Complaint  Patient presents with  . Memory Loss    HPI  Dementia : going on for years, seen by Dr. Jimmey Ralph , neurologist at [facility] in 2014. Had labs and CT at the time, was advised to do otc supplementation. Daughter states his cognition is waxing and wanes, but had a severe episode of confusion this past weekend and was unable to call her, he ended up calling 911. She was not aware where he was. He also is not sure where he is at times, he lives alone, daughter checks on his on a regular basis. His daughter sets up 3 days out of time and she calls him to remind him to take medication. He has not been driving for a long time, daughter pays his bills over the phone, he still managing his banking account . He does not paranoia ( he handed his debit card and social security card to his daughter during the visit today)   Previous MMS done in 2014 was 19/30 and now is 12/30 .    Patient Active Problem List   Diagnosis Date Noted  . History of alcoholism ([place]) 01/05/2017  . Hiatal hernia 04/11/2015  . Emphysema lung ([place]) 04/11/2015  . Protein malnutrition ([place]) 03/18/2015  . Anemia in chronic illness 11/09/2014  . Abdominal aortic atherosclerosis ([place]) 11/09/2014  . A-fib ([place]) 11/09/2014  . Atrial hypertrophy 11/09/2014  . Carotid arterial disease ([place]) 11/09/2014  . Chronic kidney disease (CKD), stage III (moderate) ([place]) 11/09/2014  . Diabetes mellitus with renal manifestations, uncontrolled ([place]) 11/09/2014  . Diastolic dysfunction with heart failure ([place]) 11/09/2014  . Dysfunction of eustachian tube 11/09/2014  . Dyslipidemia associated with type 2 diabetes mellitus ([place]) 11/09/2014  . Essential (primary) hypertension 11/09/2014  . Gastro-esophageal reflux disease without esophagitis 11/09/2014  . Hearing loss of left ear 11/09/2014  . H/O carotid  endarterectomy 11/09/2014  . H/O malignant neoplasm of colon 11/09/2014  . H/O infectious disease 11/09/2014  . H/O malignant neoplasm of prostate 11/09/2014  . Lung nodule, solitary 11/09/2014  . Mild cognitive disorder 11/09/2014  . TI (tricuspid incompetence) 11/09/2014    Past Surgical History:  Procedure Laterality Date  . ENDARTERECTOMY Left    Carotid  . KNEE SURGERY Right   . PARTIAL COLECTOMY     with Anastomosis  . SHOULDER SURGERY Right     Family History  Problem Relation Age of Onset  . Alcohol abuse Brother   . Diabetes Daughter     Social History   Socioeconomic History  . Marital status: Widowed    Spouse name: Janett Billow  . Number of children: 3  . Years of education: Not on file  . Highest education level: 11th grade  Occupational History  . Occupation: retired  Scientific laboratory technician  . Financial resource strain: Not hard at all  . Food insecurity    Worry: Never true    Inability: Never true  . Transportation needs    Medical: No    Non-medical: No  Tobacco Use  . Smoking status: Former Smoker    Packs/day: 1.00    Years: 10.00    Pack years: 10.00    Types: Cigarettes    Quit date: 05/15/1988    Years since quitting: 30.4  . Smokeless tobacco: Never Used  . Tobacco comment: smoking cessation materials  not required  Substance and Sexual Activity  . Alcohol use: No    Alcohol/week: 0.0 standard drinks    Comment: used to be a heavy drinker but quit 40 years ago   . Drug use: Yes    Types: Marijuana    Comment: many years ago   . Sexual activity: Not Currently  Lifestyle  . Physical activity    Days per week: 0 days    Minutes per session: 0 min  . Stress: Not at all  Relationships  . Social Herbalist on phone: Patient refused    Gets together: Patient refused    Attends religious service: Patient refused    Active member of club or organization: Patient refused    Attends meetings of clubs or organizations: Patient refused     Relationship status: Patient refused  . Intimate partner violence    Fear of current or ex partner: No    Emotionally abused: No    Physically abused: No    Forced sexual activity: No  Other Topics Concern  . Not on file  Social History Narrative   He lives alone     Current Outpatient Medications:  .  Cyanocobalamin (VITAMIN B 12) 100 MCG LOZG, Take 100 mcg by mouth daily. , Disp: , Rfl:  .  dabigatran (PRADAXA) 75 MG CAPS capsule, Take 2 tablets by mouth 2 (two) times daily., Disp: , Rfl:  .  diltiazem (CARTIA XT) 240 MG 24 hr capsule, Take 1 capsule by mouth daily., Disp: , Rfl:  .  glucose blood (FREESTYLE LITE) test strip, TEST BLOOD SUGAR THREE TIMES DAILY AS DIRECTED, Disp: 300 each, Rfl: 2 .  hydrALAZINE (APRESOLINE) 50 MG tablet, Take 1 tablet by mouth 4 (four) times daily., Disp: , Rfl:  .  hydrochlorothiazide (HYDRODIURIL) 25 MG tablet, Take it daily, Disp: 90 tablet, Rfl: 1 .  Insulin Glargine (LANTUS SOLOSTAR) 100 UNIT/ML Solostar Pen, Inject 8 Units into the skin daily., Disp: 15 mL, Rfl: 1 .  Insulin Pen Needle (B-D ULTRAFINE III SHORT PEN) 31G X 8 MM MISC, Daily with lantus, Disp: 100 each, Rfl: 3 .  lisinopril (PRINIVIL,ZESTRIL) 20 MG tablet, TAKE 1 TABLET(20 MG) BY MOUTH TWICE DAILY, Disp: 180 tablet, Rfl: 1 .  rosuvastatin (CRESTOR) 40 MG tablet, TAKE 1 TABLET BY MOUTH DAILY, Disp: 90 tablet, Rfl: 0  Allergies  Allergen Reactions  . Aspirin     tachycardia    I personally reviewed active problem list, medication list, allergies, family history with the patient/caregiver today.   ROS  Ten systems reviewed and is negative except as mentioned in HPI   Objective  Vitals:   10/31/18 1113  BP: (!) 142/84  Pulse: 70  Resp: 16  Temp: 98.4 F (36.9 C)  TempSrc: Oral  SpO2: 93%  Weight: 170 lb 4.8 oz (77.2 kg)  Height: 5\' 8"  (1.727 m)    Body mass index is 25.89 kg/m.  Physical Exam  Constitutional: Patient appears well-developed and frail . No  distress.  HEENT: head atraumatic, normocephalic, pupils equal and reactive to light, neck supple,oral mucosa not examed Cardiovascular: Normal rate, regular rhythm and normal heart sounds.  No murmur heard. No BLE edema. Pulmonary/Chest: Effort normal and breath sounds normal. No respiratory distress. Abdominal: Soft.  There is no tenderness. Psychiatric: Patient has a normal mood and affect. cooperative  Recent Results (from the past 2160 hour(s))  POCT HgB A1C     Status: Normal   Collection Time:  08/23/18  1:39 PM  Result Value Ref Range   Hemoglobin A1C     HbA1c POC (<> result, manual entry)     HbA1c, POC (prediabetic range)     HbA1c, POC (controlled diabetic range) 6.4 0.0 - 7.0 %  POCT urinalysis dipstick     Status: Abnormal   Collection Time: 08/23/18  2:03 PM  Result Value Ref Range   Color, UA yellow    Clarity, UA brown sediment in urine    Glucose, UA Negative Negative   Bilirubin, UA neg    Ketones, UA neg    Spec Grav, UA 1.010 1.010 - 1.025   Blood, UA negative    pH, UA 6.0 5.0 - 8.0   Protein, UA Positive (A) Negative    Comment: Trace   Urobilinogen, UA 0.2 0.2 or 1.0 E.U./dL   Nitrite, UA neg    Leukocytes, UA Small (1+) (A) Negative   Appearance     Odor    Basic metabolic panel     Status: Abnormal   Collection Time: 09/23/18 12:00 AM  Result Value Ref Range   Glucose 107    BUN 22 (A) 4 - 21   Creatinine 1.5 (A) 0.6 - 1.3   Potassium 3.5 3.4 - 5.3   Sodium 142 137 - 147      PHQ2/9: Depression screen [facility] 2/9 10/31/2018 08/23/2018 01/18/2018 07/13/2017 01/05/2017  Decreased Interest 0 0 0 0 0  Down, Depressed, Hopeless 0 0 0 0 0  PHQ - 2 Score 0 0 0 0 0  Altered sleeping 0 0 0 - -  Tired, decreased energy 0 0 0 - -  Change in appetite 0 1 0 - -  Feeling bad or failure about yourself  0 0 0 - -  Trouble concentrating 0 1 0 - -  Moving slowly or fidgety/restless 0 0 0 - -  Suicidal thoughts 0 0 0 - -  PHQ-9 Score 0 2 0 - -  Difficult doing  work/chores - Not difficult at all Not difficult at all - -    phq 9 is negative   Fall Risk: Fall Risk  10/31/2018 08/23/2018 01/18/2018 07/13/2017 01/05/2017  Falls in the past year? 0 0 No No No  Number falls in past yr: 0 0 - - -  Injury with Fall? 0 0 - - -  Risk for fall due to : - - Impaired vision;Impaired balance/gait - -  Risk for fall due to: Comment - - wears eyeglasses; ambulates with cane - -     Functional Status Survey: Is the patient deaf or have difficulty hearing?: Yes Does the patient have difficulty seeing, even when wearing glasses/contacts?: Yes Does the patient have difficulty concentrating, remembering, or making decisions?: Yes Does the patient have difficulty walking or climbing stairs?: No Does the patient have difficulty dressing or bathing?: No Does the patient have difficulty doing errands alone such as visiting a doctor's office or shopping?: Yes    Assessment & Plan   1. Confusion and disorientation  - POCT Urinalysis Dipstick - Ambulatory referral to [facility]  2. Dementia without behavioral disturbance, unspecified dementia type (HCC)  - donepezil (ARICEPT) 10 MG tablet; Take 1 tablet (10 mg total) by mouth at bedtime.  Dispense: 90 tablet; Refill: 0 - Ambulatory referral to [facility]

## 2018-10-31 NOTE — Telephone Encounter (Signed)
She is bringing him in today at 10:40.

## 2018-11-01 LAB — MICROALBUMIN / CREATININE URINE RATIO
Creatinine, Urine: 110 mg/dL (ref 20–320)
Microalb Creat Ratio: 235 mcg/mg creat — ABNORMAL HIGH (ref <30)
Microalb, Ur: 25.9 mg/dL

## 2018-11-11 ENCOUNTER — Other Ambulatory Visit: Payer: Self-pay | Admitting: Family Medicine

## 2018-11-22 DIAGNOSIS — B351 Tinea unguium: Secondary | ICD-10-CM | POA: Diagnosis not present

## 2018-11-22 DIAGNOSIS — M79675 Pain in left toe(s): Secondary | ICD-10-CM | POA: Diagnosis not present

## 2018-11-22 DIAGNOSIS — M79674 Pain in right toe(s): Secondary | ICD-10-CM | POA: Diagnosis not present

## 2018-12-10 DIAGNOSIS — I340 Nonrheumatic mitral (valve) insufficiency: Secondary | ICD-10-CM | POA: Diagnosis not present

## 2018-12-10 DIAGNOSIS — E785 Hyperlipidemia, unspecified: Secondary | ICD-10-CM | POA: Diagnosis not present

## 2018-12-10 DIAGNOSIS — I5032 Chronic diastolic (congestive) heart failure: Secondary | ICD-10-CM | POA: Diagnosis not present

## 2018-12-10 DIAGNOSIS — N183 Chronic kidney disease, stage 3 (moderate): Secondary | ICD-10-CM | POA: Diagnosis not present

## 2018-12-10 DIAGNOSIS — I10 Essential (primary) hypertension: Secondary | ICD-10-CM | POA: Diagnosis not present

## 2018-12-10 DIAGNOSIS — I4891 Unspecified atrial fibrillation: Secondary | ICD-10-CM | POA: Diagnosis not present

## 2018-12-10 DIAGNOSIS — E1159 Type 2 diabetes mellitus with other circulatory complications: Secondary | ICD-10-CM | POA: Diagnosis not present

## 2018-12-10 DIAGNOSIS — I700 Atherosclerosis of aorta: Secondary | ICD-10-CM | POA: Diagnosis not present

## 2018-12-10 DIAGNOSIS — E1169 Type 2 diabetes mellitus with other specified complication: Secondary | ICD-10-CM | POA: Diagnosis not present

## 2018-12-27 ENCOUNTER — Ambulatory Visit (INDEPENDENT_AMBULATORY_CARE_PROVIDER_SITE_OTHER): Payer: Medicare Other | Admitting: Family Medicine

## 2018-12-27 ENCOUNTER — Encounter: Payer: Self-pay | Admitting: Family Medicine

## 2018-12-27 DIAGNOSIS — N2581 Secondary hyperparathyroidism of renal origin: Secondary | ICD-10-CM | POA: Diagnosis not present

## 2018-12-27 DIAGNOSIS — N183 Chronic kidney disease, stage 3 unspecified: Secondary | ICD-10-CM

## 2018-12-27 DIAGNOSIS — E46 Unspecified protein-calorie malnutrition: Secondary | ICD-10-CM

## 2018-12-27 DIAGNOSIS — I700 Atherosclerosis of aorta: Secondary | ICD-10-CM | POA: Diagnosis not present

## 2018-12-27 DIAGNOSIS — F0390 Unspecified dementia without behavioral disturbance: Secondary | ICD-10-CM | POA: Diagnosis not present

## 2018-12-27 DIAGNOSIS — E1122 Type 2 diabetes mellitus with diabetic chronic kidney disease: Secondary | ICD-10-CM

## 2018-12-27 DIAGNOSIS — E785 Hyperlipidemia, unspecified: Secondary | ICD-10-CM

## 2018-12-27 DIAGNOSIS — Z794 Long term (current) use of insulin: Secondary | ICD-10-CM

## 2018-12-27 DIAGNOSIS — F1021 Alcohol dependence, in remission: Secondary | ICD-10-CM

## 2018-12-27 DIAGNOSIS — IMO0001 Reserved for inherently not codable concepts without codable children: Secondary | ICD-10-CM

## 2018-12-27 DIAGNOSIS — I5032 Chronic diastolic (congestive) heart failure: Secondary | ICD-10-CM

## 2018-12-27 DIAGNOSIS — I129 Hypertensive chronic kidney disease with stage 1 through stage 4 chronic kidney disease, or unspecified chronic kidney disease: Secondary | ICD-10-CM

## 2018-12-27 DIAGNOSIS — I10 Essential (primary) hypertension: Secondary | ICD-10-CM

## 2018-12-27 DIAGNOSIS — I480 Paroxysmal atrial fibrillation: Secondary | ICD-10-CM

## 2018-12-27 MED ORDER — ROSUVASTATIN CALCIUM 40 MG PO TABS: ORAL_TABLET | ORAL | 0 refills | Status: DC

## 2018-12-27 NOTE — Progress Notes (Signed)
Name: Justin Manning   MRN: [id number]    DOB: 1936-05-01   Date:12/27/2018       Progress Note  Subjective  Chief Complaint  Chief Complaint  Patient presents with  . Diabetes  . Hypertension  . Gastroesophageal Reflux  . Dementia    I connected with  Justin Manning  on 12/27/18 at  3:20 PM EDT by a video enabled telemedicine application and verified that I am speaking with the correct person using two identifiers.  I discussed the limitations of evaluation and management by telemedicine and the availability of in person appointments. The patient expressed understanding and agreed to proceed. Staff also discussed with the patient that there may be a patient responsible charge related to this service. Patient Location: at home Provider Location: [facility]  HPI  DMII: heused to see Dr. Gus Height he was seeingDr. Lawerance Sabal she left the practice. He has been doing well, he is on Lantus10units daily, he has been off Metformin and pre-meal insulin last  HgbA1C last visit it was down to  6.4%, discussed going down to 8 units daily because of his age, his glucose has been well controlled in the low 100 with occasional spike depending on diet. No recent episode of hypoglycemia or confusion He has proteinuria and is seeing Dr. Abigail Butts for [facility] stage III. Daughter checks on his every night and takes him dinner. He denies polyphagia, polydipsia or polyuria   Dyslipidemia: taking Crestor 40 mg daily and denies side effects, no myalgia.He will return for labs next month  HTN: taking bp medication, denies side effects of medications. No chest pain or palpitation. Seeing Dr. Fredirick Maudlin , daughter states recent bp at Dr. Ubaldo Glassing and bp was at goal   Afib: denies any symptoms, no palpitation, no chest pain, he has SOB with mild activity, but chronic and stable. Taking Pradaxa, and is compliant.  He sees Dr. Ubaldo Glassing Last echo showed global hypokinesis, but normal  EF.Recent EKG showed left buncle branch block   Chronic CHF - Diastolic : uses 2 pillows to sleep- but flat pillows, no edema, denies PND, he has SOB withmildactivity like showering,no wheezing. He sees Dr. Ubaldo Glassing every 6 months and last visit wason 07/28 . He is compliant with medication  Malnutrition: helost 20 lbs since 2017, but weight has been stable for the past 12  months, daughter not giving Ensure anymore because he was drinking 5-6 per day , advised to give him one per day   Atherosclerosis abdominal, carotid: he is on statin therapy, and Pradaxa. He will return for lipid panel   Dementia : going on for years, seen by Dr. Jimmey Ralph , neurologist at [facility] in 2014. Had labs and CT at the time, was advised to do otc supplementation. Daughter states his cognition is waxing and wanes, but had a severe episode of confusion this past weekend and was unable to call her, he ended up calling 911. She was not aware where he was. He also is not sure where he is at times, he lives alone, daughter checks on his on a regular basis. His daughter is now checking on him every evening and calls him in the afternnon to remind him to take his medication. He has not been driving for a long time, daughter pays his bills over the phone, he still managing his banking account . He has been doing well since the episode of acute confusion in June. We referred him for home health, but his  insurance does no approve home care   Previous MMS done in 2014 was 19/30 and now 10/2018 was  12/30   Patient Active Problem List   Diagnosis Date Noted  . History of alcoholism (Justin Manning) 01/05/2017  . Hiatal hernia 04/11/2015  . Emphysema lung (Justin Manning) 04/11/2015  . Protein malnutrition (Justin Manning) 03/18/2015  . Anemia in chronic illness 11/09/2014  . Abdominal aortic atherosclerosis (Justin Manning) 11/09/2014  . A-fib (Justin Manning) 11/09/2014  . Atrial hypertrophy 11/09/2014  . Carotid arterial disease (Justin Manning) 11/09/2014  . Chronic kidney disease (CKD),  stage III (moderate) (Justin Manning) 11/09/2014  . Diabetes mellitus with renal manifestations, uncontrolled (Justin Manning) 11/09/2014  . Diastolic dysfunction with heart failure (Justin Manning) 11/09/2014  . Dysfunction of eustachian tube 11/09/2014  . Dyslipidemia associated with type 2 diabetes mellitus (Justin Manning) 11/09/2014  . Essential (primary) hypertension 11/09/2014  . Gastro-esophageal reflux disease without esophagitis 11/09/2014  . Hearing loss of left ear 11/09/2014  . H/O carotid endarterectomy 11/09/2014  . H/O malignant neoplasm of colon 11/09/2014  . H/O infectious disease 11/09/2014  . H/O malignant neoplasm of prostate 11/09/2014  . Lung nodule, solitary 11/09/2014  . Mild cognitive disorder 11/09/2014  . TI (tricuspid incompetence) 11/09/2014    Past Surgical History:  Procedure Laterality Date  . ENDARTERECTOMY Left    Carotid  . KNEE SURGERY Right   . PARTIAL COLECTOMY     with Anastomosis  . SHOULDER SURGERY Right     Family History  Problem Relation Age of Onset  . Alcohol abuse Brother   . Diabetes Daughter     Social History   Socioeconomic History  . Marital status: Widowed    Spouse name: Justin Manning  . Number of children: 3  . Years of education: Not on file  . Highest education level: 11th grade  Occupational History  . Occupation: retired  Scientific laboratory technician  . Financial resource strain: Not hard at all  . Food insecurity    Worry: Never true    Inability: Never true  . Transportation needs    Medical: No    Non-medical: No  Tobacco Use  . Smoking status: Former Smoker    Packs/day: 1.00    Years: 10.00    Pack years: 10.00    Types: Cigarettes    Quit date: 05/15/1988    Years since quitting: 30.6  . Smokeless tobacco: Never Used  . Tobacco comment: smoking cessation materials not required  Substance and Sexual Activity  . Alcohol use: No    Alcohol/week: 0.0 standard drinks    Comment: used to be a heavy drinker but quit 40 years ago   . Drug use: Yes    Types:  Marijuana    Comment: many years ago   . Sexual activity: Not Currently  Lifestyle  . Physical activity    Days per week: 0 days    Minutes per session: 0 min  . Stress: Not at all  Relationships  . Social Herbalist on phone: Patient refused    Gets together: Patient refused    Attends religious service: Patient refused    Active member of club or organization: Patient refused    Attends meetings of clubs or organizations: Patient refused    Relationship status: Patient refused  . Intimate partner violence    Fear of current or ex partner: No    Emotionally abused: No    Physically abused: No    Forced sexual activity: No  Other Topics Concern  . Not on  file  Social History Narrative   He lives alone     Current Outpatient Medications:  .  Cyanocobalamin (VITAMIN B 12) 100 MCG LOZG, Take 100 mcg by mouth daily. , Disp: , Rfl:  .  dabigatran (PRADAXA) 75 MG CAPS capsule, Take 2 tablets by mouth 2 (two) times daily., Disp: , Rfl:  .  diltiazem (CARTIA XT) 240 MG 24 hr capsule, Take 1 capsule by mouth daily., Disp: , Rfl:  .  donepezil (ARICEPT) 10 MG tablet, Take 1 tablet (10 mg total) by mouth at bedtime., Disp: 90 tablet, Rfl: 0 .  glucose blood (FREESTYLE LITE) test strip, TEST BLOOD SUGAR THREE TIMES DAILY AS DIRECTED.., Disp: 300 strip, Rfl: 1 .  hydrALAZINE (APRESOLINE) 50 MG tablet, Take 1 tablet by mouth 4 (four) times daily., Disp: , Rfl:  .  hydrochlorothiazide (HYDRODIURIL) 25 MG tablet, Take it daily, Disp: 90 tablet, Rfl: 1 .  Insulin Glargine (LANTUS SOLOSTAR) 100 UNIT/ML Solostar Pen, Inject 8 Units into the skin daily., Disp: 15 mL, Rfl: 1 .  Insulin Pen Needle (B-D ULTRAFINE III SHORT PEN) 31G X 8 MM MISC, Daily with lantus, Disp: 100 each, Rfl: 3 .  lisinopril (PRINIVIL,ZESTRIL) 20 MG tablet, TAKE 1 TABLET(20 MG) BY MOUTH TWICE DAILY, Disp: 180 tablet, Rfl: 1 .  rosuvastatin (CRESTOR) 40 MG tablet, TAKE 1 TABLET BY MOUTH DAILY, Disp: 90 tablet, Rfl: 0   Allergies  Allergen Reactions  . Aspirin     tachycardia    I personally reviewed active problem list, medication list, allergies, family history, social history with the patient/caregiver today.   ROS  Ten systems reviewed and is negative except as mentioned in HPI   Objective  Virtual encounter, vitals not obtained.  There is no height or weight on file to calculate BMI.  Physical Exam  Awake, alert and oriented   PHQ2/9: Depression screen [facility] 2/9 12/27/2018 10/31/2018 08/23/2018 01/18/2018 07/13/2017  Decreased Interest 0 0 0 0 0  Down, Depressed, Hopeless 0 0 0 0 0  PHQ - 2 Score 0 0 0 0 0  Altered sleeping 0 0 0 0 -  Tired, decreased energy 0 0 0 0 -  Change in appetite 0 0 1 0 -  Feeling bad or failure about yourself  0 0 0 0 -  Trouble concentrating 0 0 1 0 -  Moving slowly or fidgety/restless 0 0 0 0 -  Suicidal thoughts 0 0 0 0 -  PHQ-9 Score 0 0 2 0 -  Difficult doing work/chores - - Not difficult at all Not difficult at all -   PHQ-2/9 Result is negative.    Fall Risk: Fall Risk  12/27/2018 10/31/2018 08/23/2018 01/18/2018 07/13/2017  Falls in the past year? 0 0 0 No No  Number falls in past yr: 0 0 0 - -  Injury with Fall? 0 0 0 - -  Risk for fall due to : - - - Impaired vision;Impaired balance/gait -  Risk for fall due to: Comment - - - wears eyeglasses; ambulates with cane -     Assessment & Plan  1. Controlled type 2 diabetes mellitus with stage 3 chronic kidney disease, with long-term current use of insulin (HCC)  - Hemoglobin A1c  2. Dementia without behavioral disturbance, unspecified dementia type [facility])  Discussed importance of visiting nursing homes  3. Chronic diastolic heart failure (HCC)  Stable  4. Abdominal aortic atherosclerosis ([place])  On statin and pradaxa  5. Secondary hyperparathyroidism of renal origin (  [place])  Under the care of Dr. Abigail Butts   6. Essential (primary) hypertension  - COMPLETE METABOLIC PANEL WITH GFR  7.  Dyslipidemia  - Lipid panel - rosuvastatin (CRESTOR) 40 MG tablet; TAKE 1 TABLET BY MOUTH DAILY  Dispense: 90 tablet; Refill: 0  8. Type 2 diabetes mellitus with chronic kidney disease and hypertension ([place])   9. History of alcoholism ([place])   10. Protein malnutrition (HCC)  Resume Ensure but once daily   12. Paroxysmal atrial fibrillation (HCC)   The patient was advised to call back or seek an in-person evaluation if the symptoms worsen or if the condition fails to improve as anticipated.  I provided 25  minutes of non-face-to-face time during this encounter.

## 2019-02-20 ENCOUNTER — Other Ambulatory Visit: Payer: Self-pay

## 2019-02-20 ENCOUNTER — Ambulatory Visit (INDEPENDENT_AMBULATORY_CARE_PROVIDER_SITE_OTHER): Payer: Medicare Other

## 2019-02-20 VITALS — BP 102/66 | HR 65 | Temp 97.3°F | Ht 68.0 in | Wt 151.1 lb

## 2019-02-20 DIAGNOSIS — Z23 Encounter for immunization: Secondary | ICD-10-CM | POA: Diagnosis not present

## 2019-02-20 DIAGNOSIS — Z0000 Encounter for general adult medical examination without abnormal findings: Secondary | ICD-10-CM | POA: Diagnosis not present

## 2019-02-20 NOTE — Progress Notes (Addendum)
Subjective:   Justin Manning is a 83 y.o. male who presents for Medicare Annual/Subsequent preventive examination.  Review of Systems:   Cardiac Risk Factors include: advanced age (>26men, >58 women);dyslipidemia;male gender;hypertension;diabetes mellitus     Objective:    Vitals: BP 102/66 (BP Location: Right Arm, Patient Position: Sitting, Cuff Size: Normal)   Pulse 65   Temp (!) 97.3 F (36.3 C) (Temporal)   Ht 5\' 8"  (1.727 m)   Wt 151 lb 1.6 oz (68.5 kg)   BMI 22.97 kg/m   Body mass index is 22.97 kg/m.  Advanced Directives 02/20/2019 01/18/2018 01/25/2017 01/05/2017 07/07/2016 08/16/2015 06/25/2015  Does Patient Have a Medical Advance Directive? No No Yes No Yes No No  Type of Advance Directive - - [place] in Chart? - - No - copy requested - No - copy requested - -  Would patient like information on creating a medical advance directive? Yes (MAU/Ambulatory/Procedural Areas - Information given) Yes (MAU/Ambulatory/Procedural Areas - Information given) - - - No - patient declined information No - patient declined information    Tobacco Social History   Tobacco Use  Smoking Status Former Smoker  . Packs/day: 1.00  . Years: 10.00  . Pack years: 10.00  . Types: Cigarettes  . Quit date: 05/15/1988  . Years since quitting: 30.7  Smokeless Tobacco Never Used  Tobacco Comment   smoking cessation materials not required     Counseling given: Not Answered Comment: smoking cessation materials not required   Clinical Intake:  Pre-visit preparation completed: Yes  Pain : No/denies pain     BMI - recorded: 22.97 Nutritional Status: BMI of 19-24  Normal Nutritional Risks: None Diabetes: Yes CBG done?: No Did pt. bring in CBG monitor from home?: No   Nutrition Risk Assessment:  Has the patient had any N/V/D within the last 2 months?  No  Does the patient have any  non-healing wounds?  No  Has the patient had any unintentional weight loss or weight gain?  Yes   Diabetes:  Is the patient diabetic?  Yes  If diabetic, was a CBG obtained today?  No  Did the patient bring in their glucometer from home?  No  How often do you monitor your CBG's? Daily per daughter.   Financial Strains and Diabetes Management:  Are you having any financial strains with the device, your supplies or your medication? No .  Does the patient want to be seen by Chronic Care Management for management of their diabetes?  No  Would the patient like to be referred to a Nutritionist or for Diabetic Management?  No   Diabetic Exams:  Diabetic Eye Exam: Completed 10/02/17 negative retinopathy. Overdue for diabetic eye exam. Pt has been advised about the importance in completing this exam.  Diabetic Foot Exam: Completed 9/619. Pt has been advised about the importance in completing this exam.   How often do you need to have someone help you when you read instructions, pamphlets, or other written materials from your doctor or pharmacy?: 3 - Sometimes  Interpreter Needed?: No  Information entered by :: Clemetine Marker LPN  Past Medical History:  Diagnosis Date  . Anemia   . CKD (chronic kidney disease), stage III    Dr. Juleen China  . Dementia ([place])   . Diabetes mellitus without complication ([place])   . GERD (gastroesophageal reflux disease)   . Hyperlipidemia   .  Hypertension   . Mitral regurgitation    Mild   Past Surgical History:  Procedure Laterality Date  . ENDARTERECTOMY Left    Carotid  . KNEE SURGERY Right   . PARTIAL COLECTOMY     with Anastomosis  . SHOULDER SURGERY Right    Family History  Problem Relation Age of Onset  . Alcohol abuse Brother   . Diabetes Daughter    Social History   Socioeconomic History  . Marital status: Widowed    Spouse name: Janett Billow  . Number of children: 3  . Years of education: Not on file  . Highest education level: 11th grade   Occupational History  . Occupation: retired  Scientific laboratory technician  . Financial resource strain: Not hard at all  . Food insecurity    Worry: Never true    Inability: Never true  . Transportation needs    Medical: No    Non-medical: No  Tobacco Use  . Smoking status: Former Smoker    Packs/day: 1.00    Years: 10.00    Pack years: 10.00    Types: Cigarettes    Quit date: 05/15/1988    Years since quitting: 30.7  . Smokeless tobacco: Never Used  . Tobacco comment: smoking cessation materials not required  Substance and Sexual Activity  . Alcohol use: No    Alcohol/week: 0.0 standard drinks    Comment: used to be a heavy drinker but quit 40 years ago   . Drug use: Not Currently    Types: Marijuana    Comment: many years ago   . Sexual activity: Not Currently  Lifestyle  . Physical activity    Days per week: 0 days    Minutes per session: 0 min  . Stress: Not at all  Relationships  . Social Herbalist on phone: Patient refused    Gets together: Patient refused    Attends religious service: Patient refused    Active member of club or organization: Patient refused    Attends meetings of clubs or organizations: Patient refused    Relationship status: Patient refused  Other Topics Concern  . Not on file  Social History Narrative   He lives alone    Outpatient Encounter Medications as of 02/20/2019  Medication Sig  . Cyanocobalamin (VITAMIN B 12) 100 MCG LOZG Take 100 mcg by mouth daily.   . dabigatran (PRADAXA) 75 MG CAPS capsule Take 2 tablets by mouth 2 (two) times daily.  Marland Kitchen diltiazem (CARTIA XT) 240 MG 24 hr capsule Take 1 capsule by mouth daily.  Marland Kitchen glucose blood (FREESTYLE LITE) test strip TEST BLOOD SUGAR THREE TIMES DAILY AS DIRECTED..  . hydrALAZINE (APRESOLINE) 50 MG tablet Take 1 tablet by mouth 4 (four) times daily.  . hydrochlorothiazide (HYDRODIURIL) 25 MG tablet Take it daily  . Insulin Glargine (LANTUS SOLOSTAR) 100 UNIT/ML Solostar Pen Inject 8 Units into  the skin daily.  . Insulin Pen Needle (B-D ULTRAFINE III SHORT PEN) 31G X 8 MM MISC Daily with lantus  . lisinopril (PRINIVIL,ZESTRIL) 20 MG tablet TAKE 1 TABLET(20 MG) BY MOUTH TWICE DAILY  . rosuvastatin (CRESTOR) 40 MG tablet TAKE 1 TABLET BY MOUTH DAILY  . donepezil (ARICEPT) 10 MG tablet Take 1 tablet (10 mg total) by mouth at bedtime. (Patient not taking: Reported on 02/20/2019)   No facility-administered encounter medications on file as of 02/20/2019.     Activities of Daily Living In your present state of health, do you have any difficulty  performing the following activities: 02/20/2019 12/27/2018  Hearing? N N  Comment declines hearing aids -  Vision? N N  Difficulty concentrating or making decisions? Tempie Donning  Walking or climbing stairs? Y Y  Dressing or bathing? N N  Doing errands, shopping? Y Y  Comment no longer drives -  Conservation officer, nature and eating ? N -  Using the Toilet? N -  In the past six months, have you accidently leaked urine? N -  Do you have problems with loss of bowel control? N -  Managing your Medications? Y -  Managing your Finances? Y -  Housekeeping or managing your Housekeeping? Y -  Some recent data might be hidden    Patient Care Team: Steele Sizer, MD as PCP - General (Family Medicine) Sharlotte Alamo, DPM as Consulting Physician (Podiatry) Ubaldo Glassing Javier Docker, MD as Consulting Physician (Cardiology) Lavonia Dana, MD as Consulting Physician (Nephrology)   Assessment:   This is a routine wellness examination for Justin Manning.  Exercise Activities and Dietary recommendations Current Exercise Habits: The patient does not participate in regular exercise at present, Exercise limited by: orthopedic condition(s)  Goals    . DIET - DECREASE SODA OR JUICE INTAKE     Pt drinks a lot of diet pepsi and would like to cut back and drink more water     . DIET - INCREASE WATER INTAKE     Recommend to drink at least 6-8 8oz glasses of water per day.       Fall Risk  Fall Risk  02/20/2019 12/27/2018 10/31/2018 08/23/2018 01/18/2018  Falls in the past year? 0 0 0 0 No  Number falls in past yr: 0 0 0 0 -  Injury with Fall? 0 0 0 0 -  Risk for fall due to : - - - - Impaired vision;Impaired balance/gait  Risk for fall due to: Comment - - - - wears eyeglasses; ambulates with cane  Follow up Falls prevention discussed - - - -   FALL RISK PREVENTION PERTAINING TO THE HOME:  Any stairs in or around the home? No  If so, do they handrails? No   Home free of loose throw rugs in walkways, pet beds, electrical cords, etc? Yes  Adequate lighting in your home to reduce risk of falls? Yes   ASSISTIVE DEVICES UTILIZED TO PREVENT FALLS:  Life alert? Yes  Use of a cane, walker or w/c? Yes  Grab bars in the bathroom? Yes Shower chair or bench in shower? No  Elevated toilet seat or a handicapped toilet? No   DME ORDERS:  DME order needed?  No   TIMED UP AND GO:  Was the test performed? Yes .  Length of time to ambulate 10 feet: 6 sec.   GAIT:  Appearance of gait: Gait slow, steady and with the use of an assistive device.   Education: Fall risk prevention has been discussed.  Intervention(s) required? No   Depression Screen PHQ 2/9 Scores 02/20/2019 12/27/2018 10/31/2018 08/23/2018  PHQ - 2 Score 0 0 0 0  PHQ- 9 Score - 0 0 2    Cognitive Function - MMSE completed 10/31/18   MMSE - Mini Mental State Exam 10/31/2018  Orientation to time 1  Orientation to Place 1  Registration 3  Attention/ Calculation 0  Recall 1  Language- name 2 objects 2  Language- repeat 1  Language- follow 3 step command 3  Language- read & follow direction 0  Write a sentence 0  Copy design  0  Total score 12     6CIT Screen 01/18/2018  What Year? 4 points  What month? 3 points  What time? 3 points  Count back from 20 4 points  Months in reverse 4 points  Repeat phrase 10 points  Total Score 28    Immunization History  Administered Date(s) Administered  . Fluad  Quad(high Dose 65+) 02/20/2019  . Influenza, High Dose Seasonal PF 03/18/2015, 03/17/2016, 03/16/2017, 01/18/2018  . Influenza-Unspecified 03/13/2013  . Pneumococcal Conjugate-13 03/18/2015  . Pneumococcal Polysaccharide-23 11/07/2012, 02/06/2014  . Tdap 02/06/2014    Qualifies for Shingles Vaccine? Yes . Due for Shingrix. Education has been provided regarding the importance of this vaccine. Pt has been advised to call insurance company to determine out of pocket expense. Advised may also receive vaccine at local pharmacy or Health Dept. Verbalized acceptance and understanding.  Tdap: Up to date  Flu Vaccine: Due for Flu vaccine. Does the patient want to receive this vaccine today?  Yes . Education has been provided regarding the importance of this vaccine but still declined. Advised may receive this vaccine at local pharmacy or Health Dept. Aware to provide a copy of the vaccination record if obtained from local pharmacy or Health Dept. Verbalized acceptance and understanding.  Pneumococcal Vaccine: Up to date   Screening Tests Health Maintenance  Topic Date Due  . OPHTHALMOLOGY EXAM  10/03/2018  . FOOT EXAM  01/19/2019  . HEMOGLOBIN A1C  02/22/2019  . TETANUS/TDAP  02/07/2024  . INFLUENZA VACCINE  Completed  . PNA vac Low Risk Adult  Completed   Cancer Screenings:  Colorectal Screening: No longer required.   Lung Cancer Screening: (Low Dose CT Chest recommended if Age 36-80 years, 30 pack-year currently smoking OR have quit w/in 15years.) does not qualify.   Additional Screening:  Hepatitis C Screening: no longer required  Vision Screening: Recommended annual ophthalmology exams for early detection of glaucoma and other disorders of the eye. Is the patient up to date with their annual eye exam?  No  - postponed due to Covid Who is the provider or what is the name of the office in which the pt attends annual eye exams? [place] Screening: Recommended  annual dental exams for proper oral hygiene  Community Resource Referral:  CRR required this visit?  No       Plan:    I have personally reviewed and addressed the Medicare Annual Wellness questionnaire and have noted the following in the patient's chart:  A. Medical and social history B. Use of alcohol, tobacco or illicit drugs  C. Current medications and supplements D. Functional ability and status E.  Nutritional status F.  Physical activity G. Advance directives H. List of other physicians I.  Hospitalizations, surgeries, and ER visits in previous 12 months J.  [place] such as hearing and vision if needed, cognitive and depression L. Referrals and appointments   In addition, I have reviewed and discussed with patient certain preventive protocols, quality metrics, and best practice recommendations. A written personalized care plan for preventive services as well as general preventive health recommendations were provided to patient.   Signed,  Clemetine Marker, LPN Nurse Health Advisor    Nurse Notes: pt accompanied to visit today by his daughter Clarene Critchley. Per patient and his daughter he only took donepezil one night but did not like the way it made him feel. She is concerned about his memory and eating habits but is in the process of  having him apply for medicaid for any possible additional assistance. Offered referral to CCM (med management and social work) and C3 but they were declined for today.

## 2019-02-20 NOTE — Patient Instructions (Addendum)
Justin Manning , Thank you for taking time to come for your Medicare Wellness Visit. I appreciate your ongoing commitment to your health goals. Please review the following plan we discussed and let me know if I can assist you in the future.   Screening recommendations/referrals: Colonoscopy: no longer required Recommended yearly ophthalmology/optometry visit for glaucoma screening and checkup Recommended yearly dental visit for hygiene and checkup  Vaccinations: Influenza vaccine: done today Pneumococcal vaccine: done 03/18/15 Tdap vaccine: done 02/06/14 Shingles vaccine: Shingrix discussed. Please contact your pharmacy for coverage information.   Advanced directives: Advance directive discussed with you today. I have provided a copy for you to complete at home and have notarized. Once this is complete please bring a copy in to our office so we can scan it into your chart.  Conditions/risks identified: Recommend drinking 6-8 glasses of water per day  Next appointment: Please follow up in one year for your Medicare Annual Wellness visit.    Influenza (Flu) Vaccine (Inactivated or Recombinant): What You Need to Know 1. Why get vaccinated? Influenza vaccine can prevent influenza (flu). Flu is a contagious disease that spreads around the [place] every year, usually between October and May. Anyone can get the flu, but it is more dangerous for some people. Infants and young children, people 40 years of age and older, pregnant women, and people with certain health conditions or a weakened immune system are at greatest risk of flu complications. Pneumonia, bronchitis, sinus infections and ear infections are examples of flu-related complications. If you have a medical condition, such as heart disease, cancer or diabetes, flu can make it worse. Flu can cause fever and chills, sore throat, muscle aches, fatigue, cough, headache, and runny or stuffy nose. Some people may have vomiting and diarrhea,  though this is more common in children than adults. Each year thousands of people in the [place] die from flu, and many more are hospitalized. Flu vaccine prevents millions of illnesses and flu-related visits to the doctor each year. 2. Influenza vaccine CDC recommends everyone 44 months of age and older get vaccinated every flu season. Children 6 months through 65 years of age may need 2 doses during a single flu season. Everyone else needs only 1 dose each flu season. It takes about 2 weeks for protection to develop after vaccination. There are many flu viruses, and they are always changing. Each year a new flu vaccine is made to protect against three or four viruses that are likely to cause disease in the upcoming flu season. Even when the vaccine doesn't exactly match these viruses, it may still provide some protection. Influenza vaccine does not cause flu. Influenza vaccine may be given at the same time as other vaccines. 3. Talk with your health care provider Tell your vaccine provider if the person getting the vaccine:  Has had an allergic reaction after a previous dose of influenza vaccine, or has any severe, life-threatening allergies.  Has ever had Guillain-Barr Syndrome (also called GBS). In some cases, your health care provider may decide to postpone influenza vaccination to a future visit. People with minor illnesses, such as a cold, may be vaccinated. People who are moderately or severely ill should usually wait until they recover before getting influenza vaccine. Your health care provider can give you more information. 4. Risks of a vaccine reaction  Soreness, redness, and swelling where shot is given, fever, muscle aches, and headache can happen after influenza vaccine.  There may be a very small increased  risk of Guillain-Barr Syndrome (GBS) after inactivated influenza vaccine (the flu shot). Young children who get the flu shot along with pneumococcal vaccine (PCV13),  and/or DTaP vaccine at the same time might be slightly more likely to have a seizure caused by fever. Tell your health care provider if a child who is getting flu vaccine has ever had a seizure. People sometimes faint after medical procedures, including vaccination. Tell your provider if you feel dizzy or have vision changes or ringing in the ears. As with any medicine, there is a very remote chance of a vaccine causing a severe allergic reaction, other serious injury, or death. 5. What if there is a serious problem? An allergic reaction could occur after the vaccinated person leaves the clinic. If you see signs of a severe allergic reaction (hives, swelling of the face and throat, difficulty breathing, a fast heartbeat, dizziness, or weakness), call 9-1-1 and get the person to the nearest hospital. For other signs that concern you, call your health care provider. Adverse reactions should be reported to the Vaccine Adverse Event Reporting System (VAERS). Your health care provider will usually file this report, or you can do it yourself. Visit the VAERS website at www.vaers.SamedayNews.es or call 903-508-5867.VAERS is only for reporting reactions, and VAERS staff do not give medical advice. 6. The National Vaccine Injury Compensation Program The [employer] Vaccine Injury Compensation Program (VICP) is a federal program that was created to compensate people who may have been injured by certain vaccines. Visit the VICP website at GoldCloset.com.ee or call 9288563446 to learn about the program and about filing a claim. There is a time limit to file a claim for compensation. 7. How can I learn more?  Ask your healthcare provider.  Call your local or state health department.  Contact the Centers for Disease Control and Prevention (CDC): ? Call 614 462 6745 (1-800-CDC-INFO) or ? Visit CDC's https://gibson.com/ Vaccine Information Statement (Interim) Inactivated Influenza Vaccine (12/27/2017)  This information is not intended to replace advice given to you by your health care provider. Make sure you discuss any questions you have with your health care provider. Document Released: 02/23/2006 Document Revised: 08/20/2018 Document Reviewed: 12/31/2017 Elsevier Patient Education  [place] 65 Years and Older, Male Preventive care refers to lifestyle choices and visits with your health care provider that can promote health and wellness. What does preventive care include?  A yearly physical exam. This is also called an annual well check.  Dental exams once or twice a year.  Routine eye exams. Ask your health care provider how often you should have your eyes checked.  Personal lifestyle choices, including:  Daily care of your teeth and gums.  Regular physical activity.  Eating a healthy diet.  Avoiding tobacco and drug use.  Limiting alcohol use.  Practicing safe sex.  Taking low doses of aspirin every day.  Taking vitamin and mineral supplements as recommended by your health care provider. What happens during an annual well check? The services and screenings done by your health care provider during your annual well check will depend on your age, overall health, lifestyle risk factors, and family history of disease. Counseling  Your health care provider may ask you questions about your:  Alcohol use.  Tobacco use.  Drug use.  Emotional well-being.  Home and relationship well-being.  Sexual activity.  Eating habits.  History of falls.  Memory and ability to understand (cognition).  Work and work Statistician. Screening  You may have  the following tests or measurements:  Height, weight, and BMI.  Blood pressure.  Lipid and cholesterol levels. These may be checked every 5 years, or more frequently if you are over 38 years old.  Skin check.  Lung cancer screening. You may have this screening every year starting at age 63  if you have a 30-pack-year history of smoking and currently smoke or have quit within the past 15 years.  Fecal occult blood test (FOBT) of the stool. You may have this test every year starting at age 60.  Flexible sigmoidoscopy or colonoscopy. You may have a sigmoidoscopy every 5 years or a colonoscopy every 10 years starting at age 12.  Prostate cancer screening. Recommendations will vary depending on your family history and other risks.  Hepatitis C blood test.  Hepatitis B blood test.  Sexually transmitted disease (STD) testing.  Diabetes screening. This is done by checking your blood sugar (glucose) after you have not eaten for a while (fasting). You may have this done every 1-3 years.  Abdominal aortic aneurysm (AAA) screening. You may need this if you are a current or former smoker.  Osteoporosis. You may be screened starting at age 83 if you are at high risk. Talk with your health care provider about your test results, treatment options, and if necessary, the need for more tests. Vaccines  Your health care provider may recommend certain vaccines, such as:  Influenza vaccine. This is recommended every year.  Tetanus, diphtheria, and acellular pertussis (Tdap, Td) vaccine. You may need a Td booster every 10 years.  Zoster vaccine. You may need this after age 63.  Pneumococcal 13-valent conjugate (PCV13) vaccine. One dose is recommended after age 1.  Pneumococcal polysaccharide (PPSV23) vaccine. One dose is recommended after age 46. Talk to your health care provider about which screenings and vaccines you need and how often you need them. This information is not intended to replace advice given to you by your health care provider. Make sure you discuss any questions you have with your health care provider. Document Released: 05/28/2015 Document Revised: 01/19/2016 Document Reviewed: 03/02/2015 Elsevier Interactive Patient Education  2017 [place] Prevention in  the Home Falls can cause injuries. They can happen to people of all ages. There are many things you can do to make your home safe and to help prevent falls. What can I do on the outside of my home?  Regularly fix the edges of walkways and driveways and fix any cracks.  Remove anything that might make you trip as you walk through a door, such as a raised step or threshold.  Trim any bushes or trees on the path to your home.  Use bright outdoor lighting.  Clear any walking paths of anything that might make someone trip, such as rocks or tools.  Regularly check to see if handrails are loose or broken. Make sure that both sides of any steps have handrails.  Any raised decks and porches should have guardrails on the edges.  Have any leaves, snow, or ice cleared regularly.  Use sand or salt on walking paths during winter.  Clean up any spills in your garage right away. This includes oil or grease spills. What can I do in the bathroom?  Use night lights.  Install grab bars by the toilet and in the tub and shower. Do not use towel bars as grab bars.  Use non-skid mats or decals in the tub or shower.  If you need to sit down  in the shower, use a plastic, non-slip stool.  Keep the floor dry. Clean up any water that spills on the floor as soon as it happens.  Remove soap buildup in the tub or shower regularly.  Attach bath mats securely with double-sided non-slip rug tape.  Do not have throw rugs and other things on the floor that can make you trip. What can I do in the bedroom?  Use night lights.  Make sure that you have a light by your bed that is easy to reach.  Do not use any sheets or blankets that are too big for your bed. They should not hang down onto the floor.  Have a firm chair that has side arms. You can use this for support while you get dressed.  Do not have throw rugs and other things on the floor that can make you trip. What can I do in the kitchen?  Clean up  any spills right away.  Avoid walking on wet floors.  Keep items that you use a lot in easy-to-reach places.  If you need to reach something above you, use a strong step stool that has a grab bar.  Keep electrical cords out of the way.  Do not use floor polish or wax that makes floors slippery. If you must use wax, use non-skid floor wax.  Do not have throw rugs and other things on the floor that can make you trip. What can I do with my stairs?  Do not leave any items on the stairs.  Make sure that there are handrails on both sides of the stairs and use them. Fix handrails that are broken or loose. Make sure that handrails are as long as the stairways.  Check any carpeting to make sure that it is firmly attached to the stairs. Fix any carpet that is loose or worn.  Avoid having throw rugs at the top or bottom of the stairs. If you do have throw rugs, attach them to the floor with carpet tape.  Make sure that you have a light switch at the top of the stairs and the bottom of the stairs. If you do not have them, ask someone to add them for you. What else can I do to help prevent falls?  Wear shoes that:  Do not have high heels.  Have rubber bottoms.  Are comfortable and fit you well.  Are closed at the toe. Do not wear sandals.  If you use a stepladder:  Make sure that it is fully opened. Do not climb a closed stepladder.  Make sure that both sides of the stepladder are locked into place.  Ask someone to hold it for you, if possible.  Clearly mark and make sure that you can see:  Any grab bars or handrails.  First and last steps.  Where the edge of each step is.  Use tools that help you move around (mobility aids) if they are needed. These include:  Canes.  Walkers.  Scooters.  Crutches.  Turn on the lights when you go into a dark area. Replace any light bulbs as soon as they burn out.  Set up your furniture so you have a clear path. Avoid moving your  furniture around.  If any of your floors are uneven, fix them.  If there are any pets around you, be aware of where they are.  Review your medicines with your doctor. Some medicines can make you feel dizzy. This can increase your chance of falling.  Ask your doctor what other things that you can do to help prevent falls. This information is not intended to replace advice given to you by your health care provider. Make sure you discuss any questions you have with your health care provider. Document Released: 02/25/2009 Document Revised: 10/07/2015 Document Reviewed: 06/05/2014 Elsevier Interactive Patient Education  2017 [employer].

## 2019-02-26 ENCOUNTER — Other Ambulatory Visit: Payer: Self-pay | Admitting: Family Medicine

## 2019-02-26 DIAGNOSIS — IMO0001 Reserved for inherently not codable concepts without codable children: Secondary | ICD-10-CM

## 2019-02-26 DIAGNOSIS — I10 Essential (primary) hypertension: Secondary | ICD-10-CM

## 2019-02-26 DIAGNOSIS — E1122 Type 2 diabetes mellitus with diabetic chronic kidney disease: Secondary | ICD-10-CM

## 2019-03-03 ENCOUNTER — Other Ambulatory Visit: Payer: Self-pay | Admitting: Family Medicine

## 2019-03-03 DIAGNOSIS — E1122 Type 2 diabetes mellitus with diabetic chronic kidney disease: Secondary | ICD-10-CM

## 2019-03-03 NOTE — Telephone Encounter (Signed)
Prescription for BD Pen NDL SHRT 31GX34mm  Expired on 01/18/2019.

## 2019-03-18 DIAGNOSIS — E785 Hyperlipidemia, unspecified: Secondary | ICD-10-CM | POA: Diagnosis not present

## 2019-03-18 DIAGNOSIS — I10 Essential (primary) hypertension: Secondary | ICD-10-CM | POA: Diagnosis not present

## 2019-03-18 DIAGNOSIS — Z794 Long term (current) use of insulin: Secondary | ICD-10-CM | POA: Diagnosis not present

## 2019-03-18 DIAGNOSIS — E1122 Type 2 diabetes mellitus with diabetic chronic kidney disease: Secondary | ICD-10-CM | POA: Diagnosis not present

## 2019-03-18 DIAGNOSIS — N183 Chronic kidney disease, stage 3 unspecified: Secondary | ICD-10-CM | POA: Diagnosis not present

## 2019-03-19 LAB — COMPLETE METABOLIC PANEL WITH GFR
AG Ratio: 1.4 (calc) (ref 1.0–2.5)
ALT: 9 U/L (ref 9–46)
AST: 16 U/L (ref 10–35)
Albumin: 3.6 g/dL (ref 3.6–5.1)
Alkaline phosphatase (APISO): 46 U/L (ref 35–144)
BUN/Creatinine Ratio: 18 (calc) (ref 6–22)
BUN: 32 mg/dL — ABNORMAL HIGH (ref 7–25)
CO2: 27 mmol/L (ref 20–32)
Calcium: 9.2 mg/dL (ref 8.6–10.3)
Chloride: 108 mmol/L (ref 98–110)
Creat: 1.74 mg/dL — ABNORMAL HIGH (ref 0.70–1.11)
GFR, Est African American: 42 mL/min/{1.73_m2} — ABNORMAL LOW (ref >=60)
GFR, Est Non African American: 36 mL/min/{1.73_m2} — ABNORMAL LOW (ref >=60)
Globulin: 2.5 g/dL (calc) (ref 1.9–3.7)
Glucose, Bld: 172 mg/dL — ABNORMAL HIGH (ref 65–99)
Potassium: 3.3 mmol/L — ABNORMAL LOW (ref 3.5–5.3)
Sodium: 144 mmol/L (ref 135–146)
Total Bilirubin: 0.4 mg/dL (ref 0.2–1.2)
Total Protein: 6.1 g/dL (ref 6.1–8.1)

## 2019-03-19 LAB — HEMOGLOBIN A1C
Hgb A1c MFr Bld: 5.7 % of total Hgb — ABNORMAL HIGH (ref <5.7)
Mean Plasma Glucose: 117 (calc)
eAG (mmol/L): 6.5 (calc)

## 2019-03-19 LAB — LIPID PANEL
Cholesterol: 132 mg/dL (ref <200)
HDL: 59 mg/dL (ref >=40)
LDL Cholesterol (Calc): 61 mg/dL (calc)
Non-HDL Cholesterol (Calc): 73 mg/dL (calc) (ref <130)
Total CHOL/HDL Ratio: 2.2 (calc) (ref <5.0)
Triglycerides: 50 mg/dL (ref <150)

## 2019-03-20 ENCOUNTER — Telehealth: Payer: Self-pay | Admitting: Family Medicine

## 2019-03-20 NOTE — Chronic Care Management (AMB) (Signed)
Chronic Care Management   Note  03/20/2019 Name: Justin Manning MRN: [id number] DOB: Aug 02, 1935  Justin Manning is a 83 y.o. year old male who is a primary care patient of Steele Sizer, MD. I reached out to [name] by phone today in response to a referral sent by Justin Manning's health plan.     Justin Manning was given information about Chronic Care Management services today including:  1. CCM service includes personalized support from designated clinical staff supervised by his physician, including individualized plan of care and coordination with other care providers 2. 24/7 contact phone numbers for assistance for urgent and routine care needs. 3. Service will only be billed when office clinical staff spend 20 minutes or more in a month to coordinate care. 4. Only one practitioner may furnish and bill the service in a calendar month. 5. The patient may stop CCM services at any time (effective at the end of the month) by phone call to the office staff. 6. The patient will be responsible for cost sharing (co-pay) of up to 20% of the service fee (after annual deductible is met).  Patient agreed to services and verbal consent obtained.   Follow up plan: Telephone appointment with CCM team member scheduled for: 05/07/2019  Friendly  ??[email or web address]   ??[phone]

## 2019-03-24 ENCOUNTER — Ambulatory Visit: Payer: Self-pay

## 2019-03-24 NOTE — Telephone Encounter (Signed)
Pt.'s daughter reports pt. Seemed "a little confused yesterday." He was alert and knew who she was - "just seemed a little off." Reports they had recently decreased his insulin. Would like an office visit. Warm transfer to [facility] in the practice for a visit.  Answer Assessment - Initial Assessment Questions 1. MAIN CONCERN OR SYMPTOM:  "What is your main concern right now?" "What questions do you have?" "What's the main symptom you're worried about?" (e.g., confusion, memory loss)     Seemed confused yesterday 2. ONSET:  "When did the symptom start (or worsen)?" (minutes, hours, days, weeks)     Yesterday 3. BETTER-SAME-WORSE: "Are you getting better, staying the same, or getting worse compared to the day you were discharged?"     Better today 4.  DIAGNOSIS: "Was patient diagnosed with dementia by a doctor?" If yes, "When?" (e.g., days, months, years ago)     No 5.  MEDICATION: "Has there been any change in medications recently?" (e.g., narcotics, antihistamines, benzodiazepines, etc.)     Decreased insulin 6.  OTHER SYMPTOMS: "Are there any other symptoms?" (e.g., fever, cough, pain, falling)     No 7. SUPPORT: Document living circumstances and support (e.g., family, nursing home)     Lives alone, but daughter checks on him frequency  Protocols used: [place]

## 2019-03-25 ENCOUNTER — Ambulatory Visit (INDEPENDENT_AMBULATORY_CARE_PROVIDER_SITE_OTHER): Payer: Medicare Other | Admitting: Family Medicine

## 2019-03-25 ENCOUNTER — Other Ambulatory Visit: Payer: Self-pay

## 2019-03-25 ENCOUNTER — Encounter: Payer: Self-pay | Admitting: Family Medicine

## 2019-03-25 VITALS — BP 122/62 | HR 98 | Temp 97.8°F | Resp 14 | Ht 68.0 in | Wt 153.0 lb

## 2019-03-25 DIAGNOSIS — E1165 Type 2 diabetes mellitus with hyperglycemia: Secondary | ICD-10-CM

## 2019-03-25 DIAGNOSIS — R4182 Altered mental status, unspecified: Secondary | ICD-10-CM

## 2019-03-25 DIAGNOSIS — E1129 Type 2 diabetes mellitus with other diabetic kidney complication: Secondary | ICD-10-CM

## 2019-03-25 DIAGNOSIS — IMO0002 Reserved for concepts with insufficient information to code with codable children: Secondary | ICD-10-CM

## 2019-03-25 LAB — POCT URINALYSIS DIPSTICK
Bilirubin, UA: NEGATIVE
Blood, UA: NEGATIVE
Glucose, UA: NEGATIVE
Ketones, UA: NEGATIVE
Leukocytes, UA: NEGATIVE
Nitrite, UA: NEGATIVE
Odor: NORMAL
Protein, UA: POSITIVE — AB
Spec Grav, UA: 1.015 (ref 1.010–1.025)
Urobilinogen, UA: 1 E.U./dL
pH, UA: 6 (ref 5.0–8.0)

## 2019-03-25 LAB — GLUCOSE, POCT (MANUAL RESULT ENTRY): POC Glucose: 77 mg/dl (ref 70–99)

## 2019-03-25 NOTE — Patient Instructions (Signed)
Blood sugar range - 100-150 for morning fasting blood sugars, also for 14 day average.  Highs will happen with random sugars after eating.  Usually those are okay as long as they are under 300 and come back down with his next couple fasting readings.   Normal low blood sugar is >70 when fasting.  But some people do not feel good if they get too low. See the handout on low blood sugar - and when he is having any symptoms that are new - check blood sugar  Heart rate normal range is 60-100, he may be occasionally lower, 50-60.  Since he has A. Fib - counting his pulse for 30 sec or a whole minute will give you a more accurate rate (count for 30 second multiply x 2, or if you count for 60 seconds the number of beats that you count is his heart rate)  Blood pressure 100/60 -140/90  I would suggest checking these vital signs if you can and only when he is having new symptoms.  I would follow-up with Dr. Ancil Boozer if anything is out of range.  If patient is confused altered, having trouble walking different from his baseline, is lethargic, having sweats or passing out episodes or if your God is telling you something is wrong I suggest taking him to the ER for evaluation.

## 2019-03-25 NOTE — Progress Notes (Signed)
Patient ID: Justin Manning, male    DOB: 1935-07-10, 83 y.o.   MRN: [id number]  PCP: Steele Sizer, MD  Chief Complaint  Patient presents with  . Diabetes    follow-up    Subjective:   Justin Manning is a 83 y.o. male, presents to clinic with CC of the following:  HPI  Patient is here with his daughter who is concerned about him.  She states that on Sunday he was not acting like himself but for the past several days he is at his baseline.  She does provide most of the history today.  Patient is new to me  Daughter states that last week they were called with lab results and patient's  A1C was lower 5.7%, so his insulin dose was decreased from 8 units daily to 6 units daily.  On Friday and Saturday they made this dose adjustment.  Daughter tries to go to his house multiple times a day but she cannot get there in the morning so they do his insulin in the evening.  He also does not check his sugar and she is not sure if he has eaten or not when she is there.  So the been giving 6 units of Lantus in the evening and checking his blood sugars sometimes in the afternoon and again in the evenings generally trying to check CBG 1- 2 x a day -not able to check fasting blood sugars right now. Pt 2 days ago (Sunday) was anxious and wanted to go out of the house.  It was uncharicteristic of him.  Patient's daughter states he was a little confused but she is not very specific about this.  She describes him waiting at the door for her, he was anxious to leave the house little bit and see an anxious appearing, they drove to a nearby park, they sat in the car at the park until sundown, they did not do any physical exertion or walk around, they did not eat.  When they returned to the house between 5 and 6 PM check his blood sugars and it was 214. Yesterday they started checking blood sugars in the morning.  They have his meter present CBGs on meter:  104 11/9 214 11/8 152 11/7 125 11/6 105 11/5 108  11/4  Patient is here today he is pleasant a little bit quiet he denies any complaints right now.  He denies any confusion, change to his appetite his mood his gait.  He does have a cane with him today he in the past has used a walker, use a cane or gone without.  His daughter states that when the grocery store he does lean forward on a cart that does make him tired. Denies any urinary symptoms, unintentional weight loss.  He does have A. fib, chronic is rate controlled, his blood pressure is good today, he denies any near syncope, lightheadedness, dizziness, shortness of breath, chest pain.  He states that sometimes he can feel palpitations and sometimes not.  He denies any lower extremity edema.  His daughter is concerned about the confusion on Sunday that resolved spontaneously.  She has tried to get to his house in the mornings and check his blood sugar the morning she is just not sure if he sleeps until 11 or if he is eating or fasting at that time.  There are no low blood sugar readings in the meter and no history of low blood sugar recently.  Is a history of  mild cognitive disorder, patient is not taking any medicines for this  I have reviewed his recent labs and chronic care management appointment labs pertinent for low potassium stable chronic renal disease stage IIIb, A1c 5.7, cholesterol panel within normal limits, liver function test normal.  No other specific concerns or associated symptoms at this time.  MA remarks that she detected some malodorous urine smell, I did not spill it during the visit but we did agree to check his urine to rule out any UTI that could be causing some confusion or change in his behavior or gait  Patient Active Problem List   Diagnosis Date Noted  . History of alcoholism ([place]) 01/05/2017  . Hiatal hernia 04/11/2015  . Emphysema lung ([place]) 04/11/2015  . Protein malnutrition ([place]) 03/18/2015  . Anemia in chronic illness 11/09/2014  . Abdominal aortic  atherosclerosis ([place]) 11/09/2014  . A-fib ([place]) 11/09/2014  . Atrial hypertrophy 11/09/2014  . Carotid arterial disease ([place]) 11/09/2014  . Chronic kidney disease (CKD), stage III (moderate) 11/09/2014  . Diabetes mellitus with renal manifestations, uncontrolled ([place]) 11/09/2014  . Diastolic dysfunction with heart failure ([place]) 11/09/2014  . Dysfunction of eustachian tube 11/09/2014  . Dyslipidemia associated with type 2 diabetes mellitus ([place]) 11/09/2014  . Essential (primary) hypertension 11/09/2014  . Gastro-esophageal reflux disease without esophagitis 11/09/2014  . Hearing loss of left ear 11/09/2014  . H/O carotid endarterectomy 11/09/2014  . H/O malignant neoplasm of colon 11/09/2014  . H/O infectious disease 11/09/2014  . H/O malignant neoplasm of prostate 11/09/2014  . Lung nodule, solitary 11/09/2014  . Mild cognitive disorder 11/09/2014  . TI (tricuspid incompetence) 11/09/2014      Current Outpatient Medications:  .  Cyanocobalamin (VITAMIN B 12) 100 MCG LOZG, Take 100 mcg by mouth daily. , Disp: , Rfl:  .  dabigatran (PRADAXA) 75 MG CAPS capsule, Take 2 tablets by mouth 2 (two) times daily., Disp: , Rfl:  .  diltiazem (CARTIA XT) 240 MG 24 hr capsule, Take 1 capsule by mouth daily., Disp: , Rfl:  .  hydrALAZINE (APRESOLINE) 50 MG tablet, Take 1 tablet by mouth 4 (four) times daily., Disp: , Rfl:  .  hydrochlorothiazide (HYDRODIURIL) 25 MG tablet, TAKE 1 TABLET BY MOUTH DAILY, Disp: 90 tablet, Rfl: 1 .  Insulin Glargine (LANTUS SOLOSTAR) 100 UNIT/ML Solostar Pen, Inject 8 Units into the skin daily. (Patient taking differently: Inject 6 Units into the skin daily. ), Disp: 15 mL, Rfl: 1 .  lisinopril (PRINIVIL,ZESTRIL) 20 MG tablet, TAKE 1 TABLET(20 MG) BY MOUTH TWICE DAILY, Disp: 180 tablet, Rfl: 1 .  rosuvastatin (CRESTOR) 40 MG tablet, TAKE 1 TABLET BY MOUTH DAILY, Disp: 90 tablet, Rfl: 0 .  donepezil (ARICEPT) 10 MG tablet, Take 1 tablet (10 mg total) by mouth at  bedtime. (Patient not taking: Reported on 02/20/2019), Disp: 90 tablet, Rfl: 0 .  glucose blood (FREESTYLE LITE) test strip, TEST BLOOD SUGAR THREE TIMES DAILY AS DIRECTED.., Disp: 300 strip, Rfl: 1 .  Insulin Pen Needle (B-D ULTRAFINE III SHORT PEN) 31G X 8 MM MISC, USE DAILY WITH LANTUS, Disp: 100 each, Rfl: 3   Allergies  Allergen Reactions  . Aspirin     tachycardia     Family History  Problem Relation Age of Onset  . Alcohol abuse Brother   . Diabetes Daughter      Social History   Socioeconomic History  . Marital status: Widowed    Spouse name: Janett Billow  . Number of children: 3  .  Years of education: Not on file  . Highest education level: 11th grade  Occupational History  . Occupation: retired  Scientific laboratory technician  . Financial resource strain: Not hard at all  . Food insecurity    Worry: Never true    Inability: Never true  . Transportation needs    Medical: No    Non-medical: No  Tobacco Use  . Smoking status: Former Smoker    Packs/day: 1.00    Years: 10.00    Pack years: 10.00    Types: Cigarettes    Quit date: 05/15/1988    Years since quitting: 30.8  . Smokeless tobacco: Never Used  . Tobacco comment: smoking cessation materials not required  Substance and Sexual Activity  . Alcohol use: No    Alcohol/week: 0.0 standard drinks    Comment: used to be a heavy drinker but quit 40 years ago   . Drug use: Not Currently    Types: Marijuana    Comment: many years ago   . Sexual activity: Not Currently  Lifestyle  . Physical activity    Days per week: 0 days    Minutes per session: 0 min  . Stress: Not at all  Relationships  . Social Herbalist on phone: Patient refused    Gets together: Patient refused    Attends religious service: Patient refused    Active member of club or organization: Patient refused    Attends meetings of clubs or organizations: Patient refused    Relationship status: Patient refused  . Intimate partner violence    Fear of  current or ex partner: No    Emotionally abused: No    Physically abused: No    Forced sexual activity: No  Other Topics Concern  . Not on file  Social History Narrative   He lives alone    I personally reviewed active problem list, medication list, allergies, family history, social history, health maintenance, notes from last encounter, lab results, imaging with the patient/caregiver today.  Review of Systems  Constitutional: Negative.   HENT: Negative.   Eyes: Negative.   Respiratory: Negative.   Cardiovascular: Negative.   Gastrointestinal: Negative.   Endocrine: Negative.   Genitourinary: Negative.   Musculoskeletal: Negative.   Skin: Negative.   Allergic/Immunologic: Negative.   Neurological: Negative.   Hematological: Negative.   Psychiatric/Behavioral: Negative.   All other systems reviewed and are negative.      Objective:   Vitals:   03/25/19 1007  BP: 122/62  Pulse: 98  Resp: 14  Temp: 97.8 F (36.6 C)  SpO2: 96%  Weight: 153 lb (69.4 kg)  Height: 5\' 8"  (1.727 m)    Body mass index is 23.26 kg/m.  Physical Exam Vitals signs and nursing note reviewed.  Constitutional:      General: He is not in acute distress.    Appearance: Normal appearance. He is well-developed. He is not ill-appearing, toxic-appearing or diaphoretic.     Interventions: Face mask in place.  HENT:     Head: Normocephalic and atraumatic.     Jaw: No trismus.     Right Ear: External ear normal.     Left Ear: External ear normal.     Nose: Nose normal.  Eyes:     General: Lids are normal. No scleral icterus.       Right eye: No discharge.        Left eye: No discharge.     Conjunctiva/sclera: Conjunctivae normal.  Pupils: Pupils are equal, round, and reactive to light.  Neck:     Musculoskeletal: Normal range of motion and neck supple.     Trachea: Trachea and phonation normal. No tracheal deviation.  Cardiovascular:     Rate and Rhythm: Bradycardia present. Rhythm  irregular.     Pulses: Normal pulses.          Radial pulses are 2+ on the right side and 2+ on the left side.       Posterior tibial pulses are 2+ on the right side and 2+ on the left side.     Heart sounds: Normal heart sounds. No murmur. No friction rub. No gallop.      Comments: Heart rate 56 at time of exam when counting for a full minute Pulmonary:     Effort: Pulmonary effort is normal. No respiratory distress.     Breath sounds: Normal breath sounds. No stridor. No wheezing, rhonchi or rales.  Abdominal:     General: Bowel sounds are normal. There is no distension.     Palpations: Abdomen is soft.     Tenderness: There is no abdominal tenderness. There is no guarding or rebound.  Musculoskeletal:     Right lower leg: No edema.     Left lower leg: No edema.  Skin:    General: Skin is warm and dry.     Coloration: Skin is not jaundiced or pale.     Findings: No rash.     Nails: There is no clubbing.   Neurological:     Mental Status: He is alert.     Cranial Nerves: No dysarthria or facial asymmetry.     Motor: No tremor or abnormal muscle tone.     Coordination: Coordination normal.     Gait: Gait abnormal.     Comments: Walking slowly with a cane  Psychiatric:        Mood and Affect: Mood normal.        Speech: Speech normal.        Behavior: Behavior normal. Behavior is cooperative.       Results for orders placed or performed in visit on 12/27/18  Lipid panel  Result Value Ref Range   Cholesterol 132 <200 mg/dL   HDL 59 > OR = 40 mg/dL   Triglycerides 50 <150 mg/dL   LDL Cholesterol (Calc) 61 mg/dL (calc)   Total CHOL/HDL Ratio 2.2 <5.0 (calc)   Non-HDL Cholesterol (Calc) 73 <130 mg/dL (calc)  Hemoglobin A1c  Result Value Ref Range   Hgb A1c MFr Bld 5.7 (H) <5.7 % of total Hgb   Mean Plasma Glucose 117 (calc)   eAG (mmol/L) 6.5 (calc)  COMPLETE METABOLIC PANEL WITH GFR  Result Value Ref Range   Glucose, Bld 172 (H) 65 - 99 mg/dL   BUN 32 (H) 7 - 25  mg/dL   Creat 1.74 (H) 0.70 - 1.11 mg/dL   GFR, Est Non African American 36 (L) > OR = 60 mL/min/1.38m2   GFR, Est African American 42 (L) > OR = 60 mL/min/1.60m2   BUN/Creatinine Ratio 18 6 - 22 (calc)   Sodium 144 135 - 146 mmol/L   Potassium 3.3 (L) 3.5 - 5.3 mmol/L   Chloride 108 98 - 110 mmol/L   CO2 27 20 - 32 mmol/L   Calcium 9.2 8.6 - 10.3 mg/dL   Total Protein 6.1 6.1 - 8.1 g/dL   Albumin 3.6 3.6 - 5.1 g/dL   Globulin 2.5 1.9 -  3.7 g/dL (calc)   AG Ratio 1.4 1.0 - 2.5 (calc)   Total Bilirubin 0.4 0.2 - 1.2 mg/dL   Alkaline phosphatase (APISO) 46 35 - 144 U/L   AST 16 10 - 35 U/L   ALT 9 9 - 46 U/L    Results for orders placed or performed in visit on 03/25/19  POCT UA dip  Result Value Ref Range   Color, UA yellow    Clarity, UA clear    Glucose, UA Negative Negative   Bilirubin, UA neg    Ketones, UA neg    Spec Grav, UA 1.015 1.010 - 1.025   Blood, UA neg    pH, UA 6.0 5.0 - 8.0   Protein, UA Positive (A) Negative   Urobilinogen, UA 1.0 0.2 or 1.0 E.U./dL   Nitrite, UA neg    Leukocytes, UA Negative Negative   Appearance clear    Odor normal   POCT Glucose (CBG)  Result Value Ref Range   POC Glucose 77 70 - 99 mg/dl       Assessment & Plan:      ICD-10-CM   1. Altered mental status, unspecified altered mental status type  R41.82 POCT UA dip    POCT Glucose (CBG)    Urine Culture   Very brief change in the patient's personality he was more and see and anxious to get out of his house then he was confused, unclear etiology -when the symptoms were active on Sunday which was 2 days ago, his blood sugar was 214 so I doubt it was hypoglycemia.  Urine today was normal and point-of-care glucose was 77 patient was hungry and he was not jittery sweaty confused, shaky his gait was a little unsteady after sitting on exam table for a while, but overall he was at his baseline today. There is no red flags from the given history today and patient presentation vital signs  and physical exam Suggest keeping the medications and insulin dose the same and monitor as best they can fasting blood sugar level and make sure that he is not going too low Unclear if he needs to be back on Aricept or any medications he was previously on I have encouraged him to follow-up with PCP if he has any further episodes   2. Diabetes mellitus with renal manifestations, uncontrolled (HCC)  E11.29 POCT UA dip   E11.65 POCT Glucose (CBG)   Reviewed with the patient and his daughter his labs and blood sugar monitoring.  Gave handout for hypoglycemia symptoms and plan.      Pending his urine culture but the urinalysis was unremarkable Any concerns of changing memory, personality, gait that is subtle a gradual of asked him to follow-up with her PCP.  Currently no acute or concerning findings today.  VSS, pt pleasant and alert and oriented.   Delsa Grana, PA-C 03/25/19 10:14 AM

## 2019-03-27 ENCOUNTER — Other Ambulatory Visit: Payer: Self-pay | Admitting: Family Medicine

## 2019-03-27 DIAGNOSIS — N390 Urinary tract infection, site not specified: Secondary | ICD-10-CM

## 2019-03-27 LAB — URINE CULTURE
MICRO NUMBER:: 1085551
SPECIMEN QUALITY:: ADEQUATE

## 2019-03-27 MED ORDER — CEPHALEXIN 500 MG PO CAPS: 500.0000 mg | ORAL_CAPSULE | Freq: Four times a day (QID) | ORAL | 0 refills | Status: AC

## 2019-03-28 ENCOUNTER — Telehealth: Payer: Self-pay

## 2019-03-28 NOTE — Telephone Encounter (Signed)
Dr. Ancil Boozer last time pt was here you decreased his insulin to 6 units at bedtime.  His ready the night of 03/26/19 was 90 before insulin.  I called her today to check on him and last nights sugar before insulin was 115, and the am fasting was 87.  Does he need to decrease insulin further?

## 2019-03-28 NOTE — Telephone Encounter (Signed)
-----   Message from Delsa Grana, [place] sent at 03/27/2019  2:14 PM EST ----- Please call Justin Manning and his daughter and let them know that the urine culture was positive for a urine infection with Proteus Mirabilis - I will send in an effective abx, keflex, for him to take for the next 5 days (send in Keflex to [facility] - I just checked his renal function prescription says 4x a day but please have him take 3 times a day).  Please f/up if he is still having balance issues or behavior changes that are concerning.  His sx may have all been from this.

## 2019-03-28 NOTE — Telephone Encounter (Signed)
Pt.notified

## 2019-04-01 NOTE — Chronic Care Management (AMB) (Signed)
Chronic Care Management   Note  04/01/2019 Name: Justin Manning MRN: [id number] DOB: [birth year]  Justin Manning is a 83 y.o. year old male who is a primary care patient of Steele Sizer, MD. I reached out to [name] by phone today in response to a referral sent by Justin Manning's health plan.     Justin Manning was given information about Chronic Care Management services today including:  1. CCM service includes personalized support from designated clinical staff supervised by his physician, including individualized plan of care and coordination with other care providers 2. 24/7 contact phone numbers for assistance for urgent and routine care needs. 3. Service will only be billed when office clinical staff spend 20 minutes or more in a month to coordinate care. 4. Only one practitioner may furnish and bill the service in a calendar month. 5. The patient may stop CCM services at any time (effective at the end of the month) by phone call to the office staff. 6. The patient will be responsible for cost sharing (co-pay) of up to 20% of the service fee (after annual deductible is met).  Patient agreed to services and verbal consent obtained.   Follow up plan: Telephone appointment with CCM team member scheduled for: 05/07/2019  [facility]  [place], [place] [address] Direct Dial: [email or web address]  Website: [email or web address]

## 2019-04-02 DIAGNOSIS — R809 Proteinuria, unspecified: Secondary | ICD-10-CM | POA: Diagnosis not present

## 2019-04-02 DIAGNOSIS — D631 Anemia in chronic kidney disease: Secondary | ICD-10-CM | POA: Diagnosis not present

## 2019-04-02 DIAGNOSIS — I129 Hypertensive chronic kidney disease with stage 1 through stage 4 chronic kidney disease, or unspecified chronic kidney disease: Secondary | ICD-10-CM | POA: Diagnosis not present

## 2019-04-02 DIAGNOSIS — N1832 Chronic kidney disease, stage 3b: Secondary | ICD-10-CM | POA: Diagnosis not present

## 2019-04-02 DIAGNOSIS — N2581 Secondary hyperparathyroidism of renal origin: Secondary | ICD-10-CM | POA: Diagnosis not present

## 2019-05-07 ENCOUNTER — Telehealth: Payer: Medicare Other

## 2019-05-07 ENCOUNTER — Ambulatory Visit: Payer: Self-pay

## 2019-05-09 ENCOUNTER — Other Ambulatory Visit: Payer: Self-pay | Admitting: Family Medicine

## 2019-05-09 DIAGNOSIS — E785 Hyperlipidemia, unspecified: Secondary | ICD-10-CM

## 2019-05-19 ENCOUNTER — Other Ambulatory Visit: Payer: Self-pay | Admitting: Family Medicine

## 2019-05-19 DIAGNOSIS — E1122 Type 2 diabetes mellitus with diabetic chronic kidney disease: Secondary | ICD-10-CM

## 2019-05-19 DIAGNOSIS — IMO0001 Reserved for inherently not codable concepts without codable children: Secondary | ICD-10-CM

## 2019-05-19 DIAGNOSIS — I10 Essential (primary) hypertension: Secondary | ICD-10-CM

## 2019-05-19 DIAGNOSIS — I5032 Chronic diastolic (congestive) heart failure: Secondary | ICD-10-CM

## 2019-06-03 DIAGNOSIS — I340 Nonrheumatic mitral (valve) insufficiency: Secondary | ICD-10-CM | POA: Diagnosis not present

## 2019-06-03 DIAGNOSIS — E785 Hyperlipidemia, unspecified: Secondary | ICD-10-CM | POA: Diagnosis not present

## 2019-06-03 DIAGNOSIS — I5032 Chronic diastolic (congestive) heart failure: Secondary | ICD-10-CM | POA: Diagnosis not present

## 2019-06-03 DIAGNOSIS — I4891 Unspecified atrial fibrillation: Secondary | ICD-10-CM | POA: Diagnosis not present

## 2019-06-03 DIAGNOSIS — N184 Chronic kidney disease, stage 4 (severe): Secondary | ICD-10-CM | POA: Diagnosis not present

## 2019-06-03 DIAGNOSIS — Z794 Long term (current) use of insulin: Secondary | ICD-10-CM | POA: Diagnosis not present

## 2019-06-03 DIAGNOSIS — E1122 Type 2 diabetes mellitus with diabetic chronic kidney disease: Secondary | ICD-10-CM | POA: Diagnosis not present

## 2019-06-03 DIAGNOSIS — E1169 Type 2 diabetes mellitus with other specified complication: Secondary | ICD-10-CM | POA: Diagnosis not present

## 2019-06-04 ENCOUNTER — Ambulatory Visit: Payer: Medicare Other

## 2019-06-04 NOTE — Chronic Care Management (AMB) (Signed)
  Chronic Care Management   Note  06/04/2019 Name: Justin Manning MRN: [id number] DOB: [birth year]    Attempted outreach with [name]. Call answered by his daughter [name]. Per [name], they were on the road at the time of the call. Requested to reschedule outreach. She agreed to contact the care management team later  this week to reschedule.    Follow up plan: -Pending return call from [name] and [name] regarding availability. -Will follow-up within the next 14 days if call is not returned this week.    [facility] [phone]

## 2019-06-18 DIAGNOSIS — M898X9 Other specified disorders of bone, unspecified site: Secondary | ICD-10-CM | POA: Diagnosis not present

## 2019-06-18 DIAGNOSIS — B351 Tinea unguium: Secondary | ICD-10-CM | POA: Diagnosis not present

## 2019-06-18 DIAGNOSIS — M79674 Pain in right toe(s): Secondary | ICD-10-CM | POA: Diagnosis not present

## 2019-06-18 DIAGNOSIS — M2042 Other hammer toe(s) (acquired), left foot: Secondary | ICD-10-CM | POA: Diagnosis not present

## 2019-06-18 DIAGNOSIS — M79675 Pain in left toe(s): Secondary | ICD-10-CM | POA: Diagnosis not present

## 2019-06-18 DIAGNOSIS — D492 Neoplasm of unspecified behavior of bone, soft tissue, and skin: Secondary | ICD-10-CM | POA: Diagnosis not present

## 2019-07-04 ENCOUNTER — Telehealth: Payer: Self-pay

## 2019-07-04 ENCOUNTER — Ambulatory Visit: Payer: Self-pay

## 2019-07-04 NOTE — Chronic Care Management (AMB) (Signed)
  Chronic Care Management   Outreach Note  07/04/2019 Name: Justin Manning MRN: [id number] DOB: 1935/11/23  Referred by: Steele Sizer, MD Reason for referral : Chronic Care Management   An unsuccessful telephone outreach was attempted with [name] and [name] today. Mr. Hank was referred to the case management team for assistance with care management and care coordination.   A HIPAA compliant voice message was left requesting a return call.    Follow Up Plan: The care management team will reach out again within the next two weeks.   [facility] [phone]

## 2019-07-04 NOTE — Chronic Care Management (AMB) (Signed)
  Chronic Care Management   Note for 05/07/19    Name: Justin Manning MRN: [id number] DOB: 1936-04-18   Attempted initial outreach with Mr. Statzer. Per daughter [name], he was not available. HIPAA identifiers verified.  Briefly discussed scope of care management services and availability for follow-up outreach. [name] anticipates being available on 06/04/19. Will plan to complete initial intake at that time.   Follow up plan: Will follow up as requested. [name] agreed to call if needed prior to the scheduled outreach.   [facility] [phone]

## 2019-07-16 ENCOUNTER — Ambulatory Visit: Payer: Medicare Other

## 2019-07-21 NOTE — Chronic Care Management (AMB) (Signed)
  Chronic Care Management   Note   Name: Justin Manning MRN: [id number] DOB: [birth year]   Brief outreach with patient's caregiver Justin Manning. HIPAA identifiers verified. Indicated she was not at the home with Justin Manning at the time of the call but anticipated being available later today.  Attempted a second outreach but Justin Manning and Justin Manning were not available.  Follow up plan: The care management team will reach out to Justin Manning and Justin Manning again next week.   [facility] [phone]

## 2019-07-25 ENCOUNTER — Telehealth: Payer: Self-pay

## 2019-07-30 ENCOUNTER — Other Ambulatory Visit: Payer: Self-pay | Admitting: Family Medicine

## 2019-07-30 DIAGNOSIS — E785 Hyperlipidemia, unspecified: Secondary | ICD-10-CM

## 2019-08-08 ENCOUNTER — Ambulatory Visit: Payer: Medicare Other | Admitting: Family Medicine

## 2019-08-13 DIAGNOSIS — D2272 Melanocytic nevi of left lower limb, including hip: Secondary | ICD-10-CM | POA: Diagnosis not present

## 2019-08-20 ENCOUNTER — Ambulatory Visit: Payer: Self-pay

## 2019-08-20 ENCOUNTER — Telehealth: Payer: Self-pay

## 2019-08-20 NOTE — Chronic Care Management (AMB) (Signed)
  Chronic Care Management   Outreach Note  08/20/2019 Name: Justin Manning MRN: [id number] DOB: [birth year]  Primary Care Provider: Steele Sizer, MD Reason for referral : Chronic Care Management    [name] was initially referred to the care management team for assistance with chronic care management and care coordination. His primary care provider will be notified of our unsuccessful attempts to maintain contact with [name] or his family. The care management team will gladly outreach at any time in the future if he is interested in receiving assistance.   PLAN The care management team will gladly follow up with [name] after his primary care provider has a conversation with him regarding recommendation for care management engagement and subsequent re-referral for care management services.   [facility] [phone]

## 2019-08-24 ENCOUNTER — Other Ambulatory Visit: Payer: Self-pay | Admitting: Family Medicine

## 2019-08-24 DIAGNOSIS — I5032 Chronic diastolic (congestive) heart failure: Secondary | ICD-10-CM

## 2019-08-24 DIAGNOSIS — IMO0001 Reserved for inherently not codable concepts without codable children: Secondary | ICD-10-CM

## 2019-08-24 DIAGNOSIS — E1122 Type 2 diabetes mellitus with diabetic chronic kidney disease: Secondary | ICD-10-CM

## 2019-08-24 DIAGNOSIS — I10 Essential (primary) hypertension: Secondary | ICD-10-CM

## 2019-08-24 NOTE — Telephone Encounter (Signed)
Requested Prescriptions  Pending Prescriptions Disp Refills  . lisinopril (ZESTRIL) 20 MG tablet [Pharmacy Med Name: LISINOPRIL 20MG  TABLETS] 180 tablet 0    Sig: TAKE 1 TABLET(20 MG) BY MOUTH TWICE DAILY     Cardiovascular:  ACE Inhibitors Failed - 08/24/2019  3:16 PM      Failed - Cr in normal range and within 180 days    Creat  Date Value Ref Range Status  03/18/2019 1.74 (H) 0.70 - 1.11 mg/dL Final    Comment:    For patients >82 years of age, the reference limit for Creatinine is approximately 13% higher for people identified as African-American. .    Creatinine, Urine  Date Value Ref Range Status  10/31/2018 110 20 - 320 mg/dL Final         Failed - K in normal range and within 180 days    Potassium  Date Value Ref Range Status  03/18/2019 3.3 (L) 3.5 - 5.3 mmol/L Final  01/13/2013 4.0 3.5 - 5.1 mmol/L Final         Passed - Patient is not pregnant      Passed - Last BP in normal range    BP Readings from Last 1 Encounters:  03/25/19 122/62         Passed - Valid encounter within last 6 months    Recent Outpatient Visits          5 months ago Altered mental status, unspecified altered mental status type   [facility] Delsa Grana, PA-C   8 months ago Controlled type 2 diabetes mellitus with stage 3 chronic kidney disease, with long-term current use of insulin [facility])   [facility] [place], Drue Stager, MD   9 months ago Dementia without behavioral disturbance, unspecified dementia type [facility])   [facility] Steele Sizer, MD   1 year ago Controlled type 2 diabetes mellitus with stage 3 chronic kidney disease, with long-term current use of insulin [facility])   [facility] [place], Drue Stager, MD   1 year ago Controlled type 2 diabetes mellitus with stage 3 chronic kidney disease, with long-term current use of insulin [facility])   [facility] Steele Sizer, MD      Future  Appointments            In 1 week Steele Sizer, MD [facility] [place]   In 6 months  [place]

## 2019-08-26 ENCOUNTER — Other Ambulatory Visit: Payer: Self-pay | Admitting: Family Medicine

## 2019-08-26 DIAGNOSIS — E1122 Type 2 diabetes mellitus with diabetic chronic kidney disease: Secondary | ICD-10-CM

## 2019-08-26 DIAGNOSIS — N183 Chronic kidney disease, stage 3 unspecified: Secondary | ICD-10-CM

## 2019-08-26 NOTE — Telephone Encounter (Signed)
Requested Prescriptions  Pending Prescriptions Disp Refills  . LANTUS SOLOSTAR 100 UNIT/ML Solostar Pen [Pharmacy Med Name: LANTUS SOLOSTAR PEN INJ 3ML] 15 mL 0    Sig: INJECT 8 UNITS UNDER THE SKIN DAILY     Endocrinology:  Diabetes - Insulins Passed - 08/26/2019  3:39 AM      Passed - HBA1C is between 0 and 7.9 and within 180 days    Hemoglobin A1C  Date Value Ref Range Status  07/09/2017 6.2  Final   HbA1c, POC (controlled diabetic range)  Date Value Ref Range Status  08/23/2018 6.4 0.0 - 7.0 % Final   Hgb A1c MFr Bld  Date Value Ref Range Status  03/18/2019 5.7 (H) <5.7 % of total Hgb Final    Comment:    For someone without known diabetes, a hemoglobin  A1c value between 5.7% and 6.4% is consistent with prediabetes and should be confirmed with a  follow-up test. . For someone with known diabetes, a value <7% indicates that their diabetes is well controlled. A1c targets should be individualized based on duration of diabetes, age, comorbid conditions, and other considerations. . This assay result is consistent with an increased risk of diabetes. . Currently, no consensus exists regarding use of hemoglobin A1c for diagnosis of diabetes for children. Renella Cunas - Valid encounter within last 6 months    Recent Outpatient Visits          5 months ago Altered mental status, unspecified altered mental status type   [facility] Delsa Grana, PA-C   8 months ago Controlled type 2 diabetes mellitus with stage 3 chronic kidney disease, with long-term current use of insulin [facility])   [facility] [place], Drue Stager, MD   9 months ago Dementia without behavioral disturbance, unspecified dementia type [facility])   [facility] Steele Sizer, MD   1 year ago Controlled type 2 diabetes mellitus with stage 3 chronic kidney disease, with long-term current use of insulin [facility])   [facility] [place], Drue Stager,  MD   1 year ago Controlled type 2 diabetes mellitus with stage 3 chronic kidney disease, with long-term current use of insulin [facility])   [facility] Steele Sizer, MD      Future Appointments            In 1 week Steele Sizer, MD [facility], [place]   In 6 months  [place]

## 2019-09-03 ENCOUNTER — Encounter: Payer: Self-pay | Admitting: Family Medicine

## 2019-09-03 ENCOUNTER — Other Ambulatory Visit: Payer: Self-pay | Admitting: Family Medicine

## 2019-09-03 ENCOUNTER — Ambulatory Visit (INDEPENDENT_AMBULATORY_CARE_PROVIDER_SITE_OTHER): Payer: Medicare Other | Admitting: Family Medicine

## 2019-09-03 ENCOUNTER — Other Ambulatory Visit: Payer: Self-pay

## 2019-09-03 VITALS — BP 120/72 | HR 97 | Temp 97.8°F | Resp 16 | Ht 68.0 in | Wt 143.5 lb

## 2019-09-03 DIAGNOSIS — I10 Essential (primary) hypertension: Secondary | ICD-10-CM

## 2019-09-03 DIAGNOSIS — N2581 Secondary hyperparathyroidism of renal origin: Secondary | ICD-10-CM

## 2019-09-03 DIAGNOSIS — I480 Paroxysmal atrial fibrillation: Secondary | ICD-10-CM

## 2019-09-03 DIAGNOSIS — I129 Hypertensive chronic kidney disease with stage 1 through stage 4 chronic kidney disease, or unspecified chronic kidney disease: Secondary | ICD-10-CM

## 2019-09-03 DIAGNOSIS — IMO0001 Reserved for inherently not codable concepts without codable children: Secondary | ICD-10-CM

## 2019-09-03 DIAGNOSIS — J439 Emphysema, unspecified: Secondary | ICD-10-CM

## 2019-09-03 DIAGNOSIS — E785 Hyperlipidemia, unspecified: Secondary | ICD-10-CM | POA: Diagnosis not present

## 2019-09-03 DIAGNOSIS — E1122 Type 2 diabetes mellitus with diabetic chronic kidney disease: Secondary | ICD-10-CM | POA: Diagnosis not present

## 2019-09-03 DIAGNOSIS — E46 Unspecified protein-calorie malnutrition: Secondary | ICD-10-CM | POA: Diagnosis not present

## 2019-09-03 DIAGNOSIS — D649 Anemia, unspecified: Secondary | ICD-10-CM | POA: Diagnosis not present

## 2019-09-03 DIAGNOSIS — F0390 Unspecified dementia without behavioral disturbance: Secondary | ICD-10-CM

## 2019-09-03 DIAGNOSIS — E1169 Type 2 diabetes mellitus with other specified complication: Secondary | ICD-10-CM | POA: Diagnosis not present

## 2019-09-03 DIAGNOSIS — I5032 Chronic diastolic (congestive) heart failure: Secondary | ICD-10-CM

## 2019-09-03 DIAGNOSIS — I700 Atherosclerosis of aorta: Secondary | ICD-10-CM | POA: Diagnosis not present

## 2019-09-03 DIAGNOSIS — F1021 Alcohol dependence, in remission: Secondary | ICD-10-CM

## 2019-09-03 LAB — POCT GLYCOSYLATED HEMOGLOBIN (HGB A1C): Hemoglobin A1C: 6.3 % — AB (ref 4.0–5.6)

## 2019-09-03 MED ORDER — MEMANTINE HCL 5 MG PO TABS: 5.0000 mg | ORAL_TABLET | Freq: Two times a day (BID) | ORAL | 0 refills | Status: DC

## 2019-09-03 MED ORDER — MIRTAZAPINE 15 MG PO TABS: 15.0000 mg | ORAL_TABLET | Freq: Every day | ORAL | 1 refills | Status: DC

## 2019-09-03 MED ORDER — HYDROCHLOROTHIAZIDE 12.5 MG PO TABS: 12.5000 mg | ORAL_TABLET | Freq: Every day | ORAL | 1 refills | Status: DC

## 2019-09-03 NOTE — Progress Notes (Signed)
Name: Justin Manning   MRN: [id number]    DOB: 02/06/1936   Date:09/03/2019       Progress Note  Subjective  Chief Complaint  Chief Complaint  Patient presents with  . Medication Refill    Wanted her dad check for a UTI  . Diabetes    Seeing Dr. Honor Junes at [facility] has been running around 150  . Dyslipidemia  . Atrial Fibrillation  . Malnutrition    He nibbles on food-but his eating has slowed down alot  . Atherosclerosis abdominal, carotid  . Dementia    Aricept made him feel funny and could not tolerate taking it. Patient daughter and brother taking turn taking care of him every other week    HPI  DMII: heused to see Dr. Gus Height he was seeingDr. Lawerance Sabal she left the practice. He has been doing well, he is on Lantus he has been off Metformin and pre-meal insulinlastHgbA1C last visit it was down to  5.7 % and we decreased dose of insulin from 10 units to 6 units, today A1C was 6.3%, advised to try going down to 4 units daily, his glucose at home usually 100-150. He denies polyphagia or polydipsia .  No recent episode of hypoglycemia or confusion He has proteinuria and is seeing Dr. Abigail Butts for [facility] stage III. Daughter checks on his every night and takes him dinner.   Dyslipidemia: taking Crestor 40 mg daily and denies side effects, no myalgia.we will recheck labs today since we don't know if he will be able to return to the office in 6 months   HTN: taking bp medication, denies side effects of medications. No chest pain or palpitation. Seeing Dr. Mattie Marlin [facility],since he is 66 and bp is towards low end of normal we will adjust hctz from 25 to 12.5 mg daily   Afib: denies any symptoms, no palpitation, no chest pain, he has SOB with mild activity, but chronic and stable. Taking Pradaxa, and is compliant. He sees Dr. Ubaldo Glassing Last echo showed global hypokinesis, but normal EF.Last EKG showed left buncle branch block   Chronic CHF - Diastolic : uses 2 pillows  to sleep- but flat pillows, no edema, denies PND, he has SOB withmildactivitylike showering,no wheezing. He sees Dr. Ubaldo Glassing  He is compliant with medication most of the time, daughter or son have to remind him   Malnutrition: helost 46  lbs since 2017, his weight was stable however lost another 24 lbs since last year, he skips meals/forgets to eat , unless if it is snacks. Discussed checking for malignancy but daughter and patient don't seem interested. We will check a few labs, continue protein shakes and we will att mitarzapine to improve appetite. Explained weight loss may also be from dementia or emphysema  Atherosclerosis abdominal, carotid: he is on statin therapy, and Pradaxa.   Dementia : going on for years, seen by Dr. Jimmey Ralph , neurologist at [facility] in 2014. Had labs and CT at the time, was advised to do otc supplementation. Daughter states his cognition is waxing and wanes, but had a severe episode of confusion this past weekend and was unable to call her, he ended up calling 911. She was not aware where he was. He also is not sure where he is at times, he lives alone, daughter checks on his on a regular basis. His daughter is now checking on him every evening and calls him in the afternnon to remind him to take his medication. He has not been driving  for a long time, daughter pays his bills over the phone, he still managing his banking account, but she is trying to do a joint account with him now . He stopped Aricept because it made him feel weird, we will try Namenda  Previous MMS done in 2014 was 19/30 and now 10/2018 was  12/30    Patient Active Problem List   Diagnosis Date Noted  . History of alcoholism ([place]) 01/05/2017  . Hiatal hernia 04/11/2015  . Emphysema lung ([place]) 04/11/2015  . Protein malnutrition ([place]) 03/18/2015  . Anemia in chronic illness 11/09/2014  . Abdominal aortic atherosclerosis ([place]) 11/09/2014  . A-fib ([place]) 11/09/2014  . Atrial hypertrophy 11/09/2014   . Carotid arterial disease ([place]) 11/09/2014  . Chronic kidney disease (CKD), stage III (moderate) 11/09/2014  . Diabetes mellitus with renal manifestations, uncontrolled ([place]) 11/09/2014  . Diastolic dysfunction with heart failure ([place]) 11/09/2014  . Dysfunction of eustachian tube 11/09/2014  . Dyslipidemia associated with type 2 diabetes mellitus ([place]) 11/09/2014  . Essential (primary) hypertension 11/09/2014  . Gastro-esophageal reflux disease without esophagitis 11/09/2014  . Hearing loss of left ear 11/09/2014  . H/O carotid endarterectomy 11/09/2014  . H/O malignant neoplasm of colon 11/09/2014  . H/O infectious disease 11/09/2014  . H/O malignant neoplasm of prostate 11/09/2014  . Lung nodule, solitary 11/09/2014  . Mild cognitive disorder 11/09/2014  . TI (tricuspid incompetence) 11/09/2014    Past Surgical History:  Procedure Laterality Date  . ENDARTERECTOMY Left    Carotid  . KNEE SURGERY Right   . PARTIAL COLECTOMY     with Anastomosis  . SHOULDER SURGERY Right     Family History  Problem Relation Age of Onset  . Alcohol abuse Brother   . Diabetes Daughter     Social History   Tobacco Use  . Smoking status: Former Smoker    Packs/day: 1.00    Years: 10.00    Pack years: 10.00    Types: Cigarettes    Quit date: 05/15/1988    Years since quitting: 31.3  . Smokeless tobacco: Never Used  . Tobacco comment: smoking cessation materials not required  Substance Use Topics  . Alcohol use: No    Alcohol/week: 0.0 standard drinks    Comment: used to be a heavy drinker but quit 40 years ago      Current Outpatient Medications:  .  Cyanocobalamin (VITAMIN B 12) 100 MCG LOZG, Take 100 mcg by mouth daily. , Disp: , Rfl:  .  dabigatran (PRADAXA) 75 MG CAPS capsule, Take 2 tablets by mouth 2 (two) times daily., Disp: , Rfl:  .  diltiazem (CARTIA XT) 240 MG 24 hr capsule, Take 1 capsule by mouth daily., Disp: , Rfl:  .  glucose blood (FREESTYLE LITE) test strip, TEST  BLOOD SUGAR THREE TIMES DAILY AS DIRECTED.., Disp: 300 strip, Rfl: 1 .  hydrALAZINE (APRESOLINE) 50 MG tablet, Take 1 tablet by mouth 3 (three) times daily. , Disp: , Rfl:  .  hydrochlorothiazide (HYDRODIURIL) 12.5 MG tablet, Take 1 tablet (12.5 mg total) by mouth daily. TAKE 1 TABLET BY MOUTH DAILY, Disp: 90 tablet, Rfl: 1 .  Insulin Pen Needle (B-D ULTRAFINE III SHORT PEN) 31G X 8 MM MISC, USE DAILY WITH LANTUS, Disp: 100 each, Rfl: 3 .  LANTUS SOLOSTAR 100 UNIT/ML Solostar Pen, INJECT 8 UNITS UNDER THE SKIN DAILY (Patient taking differently: Inject 6 Units into the skin daily. ), Disp: 15 mL, Rfl: 0 .  lisinopril (ZESTRIL) 20 MG tablet, TAKE  1 TABLET(20 MG) BY MOUTH TWICE DAILY, Disp: 180 tablet, Rfl: 0 .  rosuvastatin (CRESTOR) 40 MG tablet, TAKE 1 TABLET BY MOUTH DAILY, Disp: 90 tablet, Rfl: 1 .  memantine (NAMENDA) 5 MG tablet, Take 1 tablet (5 mg total) by mouth 2 (two) times daily., Disp: 60 tablet, Rfl: 0 .  mirtazapine (REMERON) 15 MG tablet, Take 1 tablet (15 mg total) by mouth at bedtime., Disp: 90 tablet, Rfl: 1  Allergies  Allergen Reactions  . Aspirin     tachycardia    I personally reviewed active problem list, medication list, allergies, family history, social history, health maintenance with the patient/caregiver today.   ROS  Constitutional: Negative for fever , positive for weight change.  Respiratory: Negative for cough and shortness of breath.   Cardiovascular: Negative for chest pain or palpitations.  Gastrointestinal: Negative for abdominal pain, no bowel changes.  Musculoskeletal: positive for gait problem but no joint swelling.  Skin: Negative for rash.  Neurological: Negative for dizziness or headache.  No other specific complaints in a complete review of systems (except as listed in HPI above).  Objective  Vitals:   09/03/19 1117  BP: 120/72  Pulse: 97  Resp: 16  Temp: 97.8 F (36.6 C)  TempSrc: Temporal  SpO2: 98%  Weight: 143 lb 8 oz (65.1 kg)   Height: 5\' 8"  (1.727 m)    Body mass index is 21.82 kg/m.  Physical Exam  Constitutional: Patient appears well-developed and malnourished, temporal waisting   No distress.  HEENT: head atraumatic, normocephalic, pupils equal and reactive to light Cardiovascular: Normal rate, regular rhythm and normal heart sounds.  No murmur heard. No BLE edema. Pulmonary/Chest: Effort normal and breath sounds normal. No respiratory distress. Abdominal: Soft.  There is no tenderness. Psychiatric: Patient has a normal mood and affect. behavior is normal. Judgment and thought content normal.   Diabetic Foot Exam: Diabetic Foot Exam - Simple   Simple Foot Form Diabetic Foot exam was performed with the following findings: Yes 09/03/2019 12:39 PM  Visual Inspection See comments: Yes Sensation Testing Intact to touch and monofilament testing bilaterally: Yes Pulse Check Posterior Tibialis and Dorsalis pulse intact bilaterally: Yes Comments Dry skin, mole on the bottom of left foot - seen podiatrist and dermatologist and only getting monitored now Thick toe nails and hammer toes      PHQ2/9: Depression screen [facility] 2/9 09/03/2019 03/25/2019 02/20/2019 12/27/2018 10/31/2018  Decreased Interest 0 1 0 0 0  Down, Depressed, Hopeless 0 1 0 0 0  PHQ - 2 Score 0 2 0 0 0  Altered sleeping 0 0 - 0 0  Tired, decreased energy 0 0 - 0 0  Change in appetite 0 0 - 0 0  Feeling bad or failure about yourself  0 0 - 0 0  Trouble concentrating 0 0 - 0 0  Moving slowly or fidgety/restless 0 0 - 0 0  Suicidal thoughts 0 0 - 0 0  PHQ-9 Score 0 2 - 0 0  Difficult doing work/chores Not difficult at all Not difficult at all - - -  Some recent data might be hidden    phq 9 is negative   Fall Risk: Fall Risk  09/03/2019 03/25/2019 02/20/2019 12/27/2018 10/31/2018  Falls in the past year? 0 0 0 0 0  Number falls in past yr: 0 0 0 0 0  Injury with Fall? 0 0 0 0 0  Risk for fall due to : - - - - -  Risk  for fall due to:  Comment - - - - -  Follow up - - Falls prevention discussed - -     Functional Status Survey: Is the patient deaf or have difficulty hearing?: No Does the patient have difficulty seeing, even when wearing glasses/contacts?: Yes Does the patient have difficulty concentrating, remembering, or making decisions?: Yes Does the patient have difficulty walking or climbing stairs?: Yes Does the patient have difficulty dressing or bathing?: No Does the patient have difficulty doing errands alone such as visiting a doctor's office or shopping?: Yes    Assessment & Plan  1. Dementia without behavioral disturbance, unspecified dementia type (HCC)  - TSH - memantine (NAMENDA) 5 MG tablet; Take 1 tablet (5 mg total) by mouth 2 (two) times daily.  Dispense: 60 tablet; Refill: 0  2. Abdominal aortic atherosclerosis ([place])   3. Secondary hyperparathyroidism of renal origin ([place])  - VITAMIN D 25 Hydroxy (Vit-D Deficiency, Fractures)  4. Type 2 diabetes mellitus with chronic kidney disease and hypertension (HCC)  - POCT glycosylated hemoglobin (Hb A1C) - hydrochlorothiazide (HYDRODIURIL) 12.5 MG tablet; Take 1 tablet (12.5 mg total) by mouth daily. TAKE 1 TABLET BY MOUTH DAILY  Dispense: 90 tablet; Refill: 1 - Microalbumin / creatinine urine ratio  5. Essential (primary) hypertension  - COMPLETE METABOLIC PANEL WITH GFR - CBC with Differential/Platelet - hydrochlorothiazide (HYDRODIURIL) 12.5 MG tablet; Take 1 tablet (12.5 mg total) by mouth daily. TAKE 1 TABLET BY MOUTH DAILY  Dispense: 90 tablet; Refill: 1  6. Dyslipidemia associated with type 2 diabetes mellitus ([place])  - Lipid panel  7. Paroxysmal atrial fibrillation (HCC)  Rate controlled   8. Chronic diastolic heart failure ([place])   9. History of alcoholism ([place])  In remission for many years  45. Protein malnutrition (HCC)  - TSH - mirtazapine (REMERON) 15 MG tablet; Take 1 tablet (15 mg total) by mouth at bedtime.   Dispense: 90 tablet; Refill: 1  11. Pulmonary emphysema, unspecified emphysema type ([place])

## 2019-09-08 ENCOUNTER — Other Ambulatory Visit: Payer: Self-pay | Admitting: Family Medicine

## 2019-09-08 DIAGNOSIS — E876 Hypokalemia: Secondary | ICD-10-CM

## 2019-09-08 MED ORDER — POTASSIUM CHLORIDE CRYS ER 20 MEQ PO TBCR: 20.0000 meq | EXTENDED_RELEASE_TABLET | Freq: Every day | ORAL | 0 refills | Status: DC

## 2019-09-10 LAB — TSH: TSH: 0.5 mIU/L (ref 0.40–4.50)

## 2019-09-10 LAB — COMPLETE METABOLIC PANEL WITH GFR
AG Ratio: 1.3 (calc) (ref 1.0–2.5)
ALT: 9 U/L (ref 9–46)
AST: 16 U/L (ref 10–35)
Albumin: 4 g/dL (ref 3.6–5.1)
Alkaline phosphatase (APISO): 51 U/L (ref 35–144)
BUN/Creatinine Ratio: 13 (calc) (ref 6–22)
BUN: 27 mg/dL — ABNORMAL HIGH (ref 7–25)
CO2: 30 mmol/L (ref 20–32)
Calcium: 9.6 mg/dL (ref 8.6–10.3)
Chloride: 103 mmol/L (ref 98–110)
Creat: 2.16 mg/dL — ABNORMAL HIGH (ref 0.70–1.11)
GFR, Est African American: 32 mL/min/{1.73_m2} — ABNORMAL LOW (ref >=60)
GFR, Est Non African American: 28 mL/min/{1.73_m2} — ABNORMAL LOW (ref >=60)
Globulin: 3 g/dL (calc) (ref 1.9–3.7)
Glucose, Bld: 130 mg/dL — ABNORMAL HIGH (ref 65–99)
Potassium: 2.9 mmol/L — ABNORMAL LOW (ref 3.5–5.3)
Sodium: 144 mmol/L (ref 135–146)
Total Bilirubin: 0.6 mg/dL (ref 0.2–1.2)
Total Protein: 7 g/dL (ref 6.1–8.1)

## 2019-09-10 LAB — CBC WITH DIFFERENTIAL/PLATELET
Absolute Monocytes: 646 cells/uL (ref 200–950)
Basophils Absolute: 19 cells/uL (ref 0–200)
Basophils Relative: 0.3 %
Eosinophils Absolute: 70 cells/uL (ref 15–500)
Eosinophils Relative: 1.1 %
HCT: 37.2 % — ABNORMAL LOW (ref 38.5–50.0)
Hemoglobin: 12.1 g/dL — ABNORMAL LOW (ref 13.2–17.1)
Lymphs Abs: 998 cells/uL (ref 850–3900)
MCH: 30.5 pg (ref 27.0–33.0)
MCHC: 32.5 g/dL (ref 32.0–36.0)
MCV: 93.7 fL (ref 80.0–100.0)
MPV: 11.2 fL (ref 7.5–12.5)
Monocytes Relative: 10.1 %
Neutro Abs: 4666 cells/uL (ref 1500–7800)
Neutrophils Relative %: 72.9 %
Platelets: 252 10*3/uL (ref 140–400)
RBC: 3.97 10*6/uL — ABNORMAL LOW (ref 4.20–5.80)
RDW: 14.2 % (ref 11.0–15.0)
Total Lymphocyte: 15.6 %
WBC: 6.4 10*3/uL (ref 3.8–10.8)

## 2019-09-10 LAB — LIPID PANEL
Cholesterol: 152 mg/dL (ref <200)
HDL: 62 mg/dL (ref >=40)
LDL Cholesterol (Calc): 77 mg/dL (calc)
Non-HDL Cholesterol (Calc): 90 mg/dL (calc) (ref <130)
Total CHOL/HDL Ratio: 2.5 (calc) (ref <5.0)
Triglycerides: 51 mg/dL (ref <150)

## 2019-09-10 LAB — TEST AUTHORIZATION

## 2019-09-10 LAB — VITAMIN D 25 HYDROXY (VIT D DEFICIENCY, FRACTURES): Vit D, 25-Hydroxy: 22 ng/mL — ABNORMAL LOW (ref 30–100)

## 2019-09-10 LAB — IRON,TIBC AND FERRITIN PANEL
%SAT: 24 % (calc) (ref 20–48)
Ferritin: 84 ng/mL (ref 24–380)
Iron: 72 ug/dL (ref 50–180)
TIBC: 302 mcg/dL (calc) (ref 250–425)

## 2019-09-15 DIAGNOSIS — N2581 Secondary hyperparathyroidism of renal origin: Secondary | ICD-10-CM | POA: Diagnosis not present

## 2019-09-15 DIAGNOSIS — D631 Anemia in chronic kidney disease: Secondary | ICD-10-CM | POA: Diagnosis not present

## 2019-09-15 DIAGNOSIS — I129 Hypertensive chronic kidney disease with stage 1 through stage 4 chronic kidney disease, or unspecified chronic kidney disease: Secondary | ICD-10-CM | POA: Diagnosis not present

## 2019-09-15 DIAGNOSIS — E876 Hypokalemia: Secondary | ICD-10-CM | POA: Diagnosis not present

## 2019-09-15 DIAGNOSIS — R809 Proteinuria, unspecified: Secondary | ICD-10-CM | POA: Diagnosis not present

## 2019-09-15 DIAGNOSIS — N1832 Chronic kidney disease, stage 3b: Secondary | ICD-10-CM | POA: Diagnosis not present

## 2019-09-29 ENCOUNTER — Encounter: Payer: Self-pay | Admitting: Emergency Medicine

## 2019-09-29 ENCOUNTER — Emergency Department: Payer: Medicare Other

## 2019-09-29 ENCOUNTER — Other Ambulatory Visit: Payer: Self-pay

## 2019-09-29 ENCOUNTER — Inpatient Hospital Stay
Admission: EM | Admit: 2019-09-29 | Discharge: 2019-10-06 | DRG: 292 | Disposition: A | Discharge status: Skilled Nursing Facility | Payer: Medicare Other | Attending: Internal Medicine | Admitting: Internal Medicine

## 2019-09-29 ENCOUNTER — Inpatient Hospital Stay
Admit: 2019-09-29 | Discharge: 2019-09-29 | Disposition: A | Discharge status: Home / Self Care | Payer: Medicare Other | Attending: Internal Medicine | Admitting: Internal Medicine

## 2019-09-29 DIAGNOSIS — Z794 Long term (current) use of insulin: Secondary | ICD-10-CM | POA: Diagnosis not present

## 2019-09-29 DIAGNOSIS — W19XXXA Unspecified fall, initial encounter: Secondary | ICD-10-CM | POA: Diagnosis present

## 2019-09-29 DIAGNOSIS — R296 Repeated falls: Secondary | ICD-10-CM | POA: Diagnosis present

## 2019-09-29 DIAGNOSIS — Z20822 Contact with and (suspected) exposure to covid-19: Secondary | ICD-10-CM | POA: Diagnosis present

## 2019-09-29 DIAGNOSIS — N400 Enlarged prostate without lower urinary tract symptoms: Secondary | ICD-10-CM | POA: Diagnosis present

## 2019-09-29 DIAGNOSIS — R778 Other specified abnormalities of plasma proteins: Secondary | ICD-10-CM | POA: Diagnosis not present

## 2019-09-29 DIAGNOSIS — G20 Parkinson's disease: Secondary | ICD-10-CM

## 2019-09-29 DIAGNOSIS — I130 Hypertensive heart and chronic kidney disease with heart failure and stage 1 through stage 4 chronic kidney disease, or unspecified chronic kidney disease: Secondary | ICD-10-CM | POA: Diagnosis present

## 2019-09-29 DIAGNOSIS — E1122 Type 2 diabetes mellitus with diabetic chronic kidney disease: Secondary | ICD-10-CM | POA: Diagnosis present

## 2019-09-29 DIAGNOSIS — I5033 Acute on chronic diastolic (congestive) heart failure: Secondary | ICD-10-CM | POA: Diagnosis not present

## 2019-09-29 DIAGNOSIS — R7989 Other specified abnormal findings of blood chemistry: Secondary | ICD-10-CM | POA: Diagnosis not present

## 2019-09-29 DIAGNOSIS — I160 Hypertensive urgency: Secondary | ICD-10-CM | POA: Diagnosis present

## 2019-09-29 DIAGNOSIS — Z886 Allergy status to analgesic agent status: Secondary | ICD-10-CM | POA: Diagnosis not present

## 2019-09-29 DIAGNOSIS — Z03818 Encounter for observation for suspected exposure to other biological agents ruled out: Secondary | ICD-10-CM | POA: Diagnosis not present

## 2019-09-29 DIAGNOSIS — E876 Hypokalemia: Secondary | ICD-10-CM | POA: Diagnosis not present

## 2019-09-29 DIAGNOSIS — I10 Essential (primary) hypertension: Secondary | ICD-10-CM | POA: Diagnosis not present

## 2019-09-29 DIAGNOSIS — Z79899 Other long term (current) drug therapy: Secondary | ICD-10-CM | POA: Diagnosis not present

## 2019-09-29 DIAGNOSIS — T502X5A Adverse effect of carbonic-anhydrase inhibitors, benzothiadiazides and other diuretics, initial encounter: Secondary | ICD-10-CM | POA: Diagnosis present

## 2019-09-29 DIAGNOSIS — M25561 Pain in right knee: Secondary | ICD-10-CM | POA: Diagnosis not present

## 2019-09-29 DIAGNOSIS — I482 Chronic atrial fibrillation, unspecified: Secondary | ICD-10-CM | POA: Diagnosis present

## 2019-09-29 DIAGNOSIS — F05 Delirium due to known physiological condition: Secondary | ICD-10-CM | POA: Diagnosis not present

## 2019-09-29 DIAGNOSIS — E785 Hyperlipidemia, unspecified: Secondary | ICD-10-CM | POA: Diagnosis present

## 2019-09-29 DIAGNOSIS — I5020 Unspecified systolic (congestive) heart failure: Secondary | ICD-10-CM | POA: Diagnosis present

## 2019-09-29 DIAGNOSIS — R809 Proteinuria, unspecified: Secondary | ICD-10-CM | POA: Diagnosis present

## 2019-09-29 DIAGNOSIS — W19XXXD Unspecified fall, subsequent encounter: Secondary | ICD-10-CM | POA: Diagnosis not present

## 2019-09-29 DIAGNOSIS — I5023 Acute on chronic systolic (congestive) heart failure: Secondary | ICD-10-CM | POA: Diagnosis not present

## 2019-09-29 DIAGNOSIS — Z7401 Bed confinement status: Secondary | ICD-10-CM | POA: Diagnosis not present

## 2019-09-29 DIAGNOSIS — I959 Hypotension, unspecified: Secondary | ICD-10-CM | POA: Diagnosis not present

## 2019-09-29 DIAGNOSIS — I129 Hypertensive chronic kidney disease with stage 1 through stage 4 chronic kidney disease, or unspecified chronic kidney disease: Secondary | ICD-10-CM | POA: Diagnosis not present

## 2019-09-29 DIAGNOSIS — S8991XA Unspecified injury of right lower leg, initial encounter: Secondary | ICD-10-CM | POA: Diagnosis not present

## 2019-09-29 DIAGNOSIS — R404 Transient alteration of awareness: Secondary | ICD-10-CM | POA: Diagnosis not present

## 2019-09-29 DIAGNOSIS — N1832 Chronic kidney disease, stage 3b: Secondary | ICD-10-CM | POA: Diagnosis not present

## 2019-09-29 DIAGNOSIS — F0390 Unspecified dementia without behavioral disturbance: Secondary | ICD-10-CM | POA: Diagnosis not present

## 2019-09-29 DIAGNOSIS — N1831 Chronic kidney disease, stage 3a: Secondary | ICD-10-CM | POA: Diagnosis not present

## 2019-09-29 DIAGNOSIS — I4891 Unspecified atrial fibrillation: Secondary | ICD-10-CM | POA: Diagnosis not present

## 2019-09-29 DIAGNOSIS — R748 Abnormal levels of other serum enzymes: Secondary | ICD-10-CM | POA: Diagnosis not present

## 2019-09-29 DIAGNOSIS — Y92239 Unspecified place in hospital as the place of occurrence of the external cause: Secondary | ICD-10-CM | POA: Diagnosis not present

## 2019-09-29 DIAGNOSIS — R4182 Altered mental status, unspecified: Secondary | ICD-10-CM | POA: Diagnosis not present

## 2019-09-29 DIAGNOSIS — Z87891 Personal history of nicotine dependence: Secondary | ICD-10-CM

## 2019-09-29 DIAGNOSIS — M2550 Pain in unspecified joint: Secondary | ICD-10-CM | POA: Diagnosis not present

## 2019-09-29 LAB — COMPREHENSIVE METABOLIC PANEL
ALT: 17 U/L (ref 0–44)
AST: 26 U/L (ref 15–41)
Albumin: 3.6 g/dL (ref 3.5–5.0)
Alkaline Phosphatase: 56 U/L (ref 38–126)
Anion gap: 8 (ref 5–15)
BUN: 14 mg/dL (ref 8–23)
CO2: 26 mmol/L (ref 22–32)
Calcium: 8.7 mg/dL — ABNORMAL LOW (ref 8.9–10.3)
Chloride: 110 mmol/L (ref 98–111)
Creatinine, Ser: 1.35 mg/dL — ABNORMAL HIGH (ref 0.61–1.24)
GFR calc Af Amer: 56 mL/min — ABNORMAL LOW (ref >60)
GFR calc non Af Amer: 49 mL/min — ABNORMAL LOW (ref >60)
Glucose, Bld: 132 mg/dL — ABNORMAL HIGH (ref 70–99)
Potassium: 2.5 mmol/L — CL (ref 3.5–5.1)
Sodium: 144 mmol/L (ref 135–145)
Total Bilirubin: 0.6 mg/dL (ref 0.3–1.2)
Total Protein: 6.9 g/dL (ref 6.5–8.1)

## 2019-09-29 LAB — URINALYSIS, COMPLETE (UACMP) WITH MICROSCOPIC
Bacteria, UA: NONE SEEN
Bilirubin Urine: NEGATIVE
Glucose, UA: NEGATIVE mg/dL
Ketones, ur: NEGATIVE mg/dL
Leukocytes,Ua: NEGATIVE
Nitrite: NEGATIVE
Protein, ur: 100 mg/dL — AB
Specific Gravity, Urine: 1.008 (ref 1.005–1.030)
pH: 6 (ref 5.0–8.0)

## 2019-09-29 LAB — CBC
HCT: 31.7 % — ABNORMAL LOW (ref 39.0–52.0)
Hemoglobin: 10.6 g/dL — ABNORMAL LOW (ref 13.0–17.0)
MCH: 30.7 pg (ref 26.0–34.0)
MCHC: 33.4 g/dL (ref 30.0–36.0)
MCV: 91.9 fL (ref 80.0–100.0)
Platelets: 207 10*3/uL (ref 150–400)
RBC: 3.45 MIL/uL — ABNORMAL LOW (ref 4.22–5.81)
RDW: 15.7 % — ABNORMAL HIGH (ref 11.5–15.5)
WBC: 10.2 10*3/uL (ref 4.0–10.5)
nRBC: 0 % (ref 0.0–0.2)

## 2019-09-29 LAB — SARS CORONAVIRUS 2 BY RT PCR (HOSPITAL ORDER, PERFORMED IN ~~LOC~~ HOSPITAL LAB): SARS Coronavirus 2: NEGATIVE

## 2019-09-29 LAB — TROPONIN I (HIGH SENSITIVITY)
Troponin I (High Sensitivity): 58 ng/L — ABNORMAL HIGH (ref <18)
Troponin I (High Sensitivity): 111 ng/L (ref <18)

## 2019-09-29 MED ORDER — VITAMIN D3 25 MCG (1000 UNIT) PO TABS
2000.0000 [IU] | ORAL_TABLET | Freq: Two times a day (BID) | ORAL | Status: DC
  Administered 2019-09-29 – 2019-10-06 (×14): 2000 [IU] via ORAL
  Filled 2019-09-29 (×26): qty 2

## 2019-09-29 MED ORDER — DILTIAZEM HCL ER COATED BEADS 240 MG PO CP24
240.0000 mg | ORAL_CAPSULE | Freq: Every day | ORAL | Status: DC
  Filled 2019-09-29: qty 1

## 2019-09-29 MED ORDER — ONDANSETRON HCL 4 MG/2ML IJ SOLN: 4.0000 mg | Freq: Four times a day (QID) | INTRAMUSCULAR | Status: DC | PRN

## 2019-09-29 MED ORDER — MIRTAZAPINE 15 MG PO TABS: 15.0000 mg | ORAL_TABLET | Freq: Every day | ORAL | Status: DC

## 2019-09-29 MED ORDER — INSULIN GLARGINE 100 UNIT/ML ~~LOC~~ SOLN
6.0000 [IU] | Freq: Every day | SUBCUTANEOUS | Status: DC
  Administered 2019-09-29 – 2019-10-05 (×7): 6 [IU] via SUBCUTANEOUS
  Filled 2019-09-29 (×8): qty 0.06

## 2019-09-29 MED ORDER — POTASSIUM CHLORIDE CRYS ER 20 MEQ PO TBCR
40.0000 meq | EXTENDED_RELEASE_TABLET | Freq: Once | ORAL | Status: AC
  Administered 2019-09-29: 40 meq via ORAL
  Filled 2019-09-29: qty 2

## 2019-09-29 MED ORDER — BISACODYL 5 MG PO TBEC: 5.0000 mg | DELAYED_RELEASE_TABLET | Freq: Every day | ORAL | Status: DC | PRN

## 2019-09-29 MED ORDER — ADULT MULTIVITAMIN W/MINERALS CH
1.0000 | ORAL_TABLET | Freq: Every day | ORAL | Status: DC
  Administered 2019-09-30 – 2019-10-06 (×7): 1 via ORAL
  Filled 2019-09-29 (×7): qty 1

## 2019-09-29 MED ORDER — POTASSIUM CHLORIDE 10 MEQ/100ML IV SOLN
10.0000 meq | Freq: Once | INTRAVENOUS | Status: AC
  Administered 2019-09-29: 10 meq via INTRAVENOUS
  Filled 2019-09-29: qty 100

## 2019-09-29 MED ORDER — SODIUM CHLORIDE 0.9% FLUSH
3.0000 mL | Freq: Once | INTRAVENOUS | Status: AC
  Administered 2019-09-29: 3 mL via INTRAVENOUS

## 2019-09-29 MED ORDER — MEMANTINE HCL 5 MG PO TABS: 5.0000 mg | ORAL_TABLET | Freq: Two times a day (BID) | ORAL | Status: DC

## 2019-09-29 MED ORDER — POTASSIUM CHLORIDE 10 MEQ/100ML IV SOLN
10.0000 meq | INTRAVENOUS | Status: AC
  Administered 2019-09-29 (×4): 10 meq via INTRAVENOUS
  Filled 2019-09-29 (×3): qty 100

## 2019-09-29 MED ORDER — LISINOPRIL 10 MG PO TABS: 20.0000 mg | ORAL_TABLET | Freq: Every day | ORAL | Status: DC

## 2019-09-29 MED ORDER — DILTIAZEM HCL ER COATED BEADS 240 MG PO CP24: 240.0000 mg | ORAL_CAPSULE | Freq: Every day | ORAL | Status: DC

## 2019-09-29 MED ORDER — ROSUVASTATIN CALCIUM 20 MG PO TABS
40.0000 mg | ORAL_TABLET | Freq: Every day | ORAL | Status: DC
  Administered 2019-09-29 – 2019-10-05 (×7): 40 mg via ORAL
  Filled 2019-09-29 (×8): qty 2

## 2019-09-29 MED ORDER — VITAMIN B-12 100 MCG PO TABS
100.0000 ug | ORAL_TABLET | Freq: Every day | ORAL | Status: DC
  Administered 2019-09-29 – 2019-10-06 (×8): 100 ug via ORAL
  Filled 2019-09-29 (×8): qty 1

## 2019-09-29 MED ORDER — DABIGATRAN ETEXILATE MESYLATE 75 MG PO CAPS
75.0000 mg | ORAL_CAPSULE | Freq: Two times a day (BID) | ORAL | Status: DC
  Administered 2019-09-29 – 2019-10-06 (×14): 75 mg via ORAL
  Filled 2019-09-29 (×16): qty 1

## 2019-09-29 MED ORDER — SODIUM CHLORIDE 0.9 % IV BOLUS
500.0000 mL | Freq: Once | INTRAVENOUS | Status: AC
  Administered 2019-09-29: 500 mL via INTRAVENOUS

## 2019-09-29 MED ORDER — LISINOPRIL 20 MG PO TABS
20.0000 mg | ORAL_TABLET | Freq: Every day | ORAL | Status: DC
  Administered 2019-09-29 – 2019-10-06 (×7): 20 mg via ORAL
  Filled 2019-09-29: qty 2
  Filled 2019-09-29 (×6): qty 1

## 2019-09-29 MED ORDER — DILTIAZEM HCL ER COATED BEADS 240 MG PO CP24
240.0000 mg | ORAL_CAPSULE | Freq: Every day | ORAL | Status: DC
  Administered 2019-09-30 – 2019-10-06 (×7): 240 mg via ORAL
  Filled 2019-09-29 (×7): qty 1

## 2019-09-29 MED ORDER — SENNOSIDES-DOCUSATE SODIUM 8.6-50 MG PO TABS: 1.0000 | ORAL_TABLET | Freq: Every evening | ORAL | Status: DC | PRN

## 2019-09-29 MED ORDER — ONDANSETRON HCL 4 MG PO TABS: 4.0000 mg | ORAL_TABLET | Freq: Four times a day (QID) | ORAL | Status: DC | PRN

## 2019-09-29 MED ORDER — HYDRALAZINE HCL 50 MG PO TABS
50.0000 mg | ORAL_TABLET | Freq: Three times a day (TID) | ORAL | Status: DC
  Administered 2019-09-29 – 2019-09-30 (×3): 50 mg via ORAL
  Filled 2019-09-29 (×3): qty 1

## 2019-09-29 MED ORDER — ENOXAPARIN SODIUM 40 MG/0.4ML ~~LOC~~ SOLN: 40.0000 mg | SUBCUTANEOUS | Status: DC

## 2019-09-29 NOTE — ED Triage Notes (Signed)
Arrives with daughter.  Per daughter, patient lives alone and has 'early dementia'.  She was called by PD this morning because patient was outside and had fallen, according to a neighbor.  Patient arrives AAOx1.  Confused to time and place.  Daughter states patient has been becoming more confused lately, but has not left the house on his on before this morning.  Daughter also states, patient medications and been changed recently, adding meds for memory and nutrition.

## 2019-09-29 NOTE — ED Notes (Signed)
Attempted to call report, was informed nurse is still in report and she would call me back when she is completed. This nurses phone number provided.

## 2019-09-29 NOTE — ED Notes (Addendum)
pts daughter took pts belongings with her home. Pt keeps shirt and glasses with him to floor. All other bags that were put together by prior shift taken by daughter including cane. Daughter was unhappy with pt having past urinated on self. This nurse informed daughter that this occurred prior to this nurses care but hat this nurse apologizes for that occurring. Daughter to home and informed of room assignment and visitation hours

## 2019-09-29 NOTE — Progress Notes (Signed)
*  PRELIMINARY RESULTS* Echocardiogram 2D Echocardiogram has been performed.  Justin Manning C Clariece Roesler 09/29/2019, 8:16 PM

## 2019-09-29 NOTE — ED Provider Notes (Signed)
[facility] Emergency Department Provider Note ____________________________________________   First MD Initiated Contact with Patient 09/29/19 1135     (approximate)  I have reviewed the triage vital signs and the nursing notes.   HISTORY  Chief Complaint ams  Level 5 caveat: History of present illness limited due to dementia  HPI Justin Manning is a 84 y.o. male with PMH as noted below including chronic kidney disease, dementia, and diabetes who presents after a possible fall when he left the house and wandered this morning.  The daughter reports that a bystander called around 5 AM saying that the patient was outside of the house and had apparently fallen.  The patient does not remember what happened, and he denies any pain.  He does report that his right knee sometimes "pops," and that this happened today.  It is not hurting him now.  The daughter reports that he has been increasingly confused and weak over the last 1 to 2 weeks, and they have begun to look into placement options since he no longer can safely be at home.  Past Medical History:  Diagnosis Date  . Anemia   . CKD (chronic kidney disease), stage III    Dr. Juleen China  . Dementia ([place])   . Diabetes mellitus without complication ([place])   . GERD (gastroesophageal reflux disease)   . Hyperlipidemia   . Hypertension   . Mitral regurgitation    Mild    Patient Active Problem List   Diagnosis Date Noted  . History of alcoholism ([place]) 01/05/2017  . Hiatal hernia 04/11/2015  . Emphysema lung ([place]) 04/11/2015  . Protein malnutrition ([place]) 03/18/2015  . Anemia in chronic illness 11/09/2014  . Abdominal aortic atherosclerosis ([place]) 11/09/2014  . A-fib ([place]) 11/09/2014  . Atrial hypertrophy 11/09/2014  . Carotid arterial disease ([place]) 11/09/2014  . Chronic kidney disease (CKD), stage III (moderate) 11/09/2014  . Diabetes mellitus with renal manifestations, uncontrolled ([place]) 11/09/2014  .  Diastolic dysfunction with heart failure ([place]) 11/09/2014  . Dysfunction of eustachian tube 11/09/2014  . Dyslipidemia associated with type 2 diabetes mellitus ([place]) 11/09/2014  . Essential (primary) hypertension 11/09/2014  . Gastro-esophageal reflux disease without esophagitis 11/09/2014  . Hearing loss of left ear 11/09/2014  . H/O carotid endarterectomy 11/09/2014  . H/O malignant neoplasm of colon 11/09/2014  . H/O infectious disease 11/09/2014  . H/O malignant neoplasm of prostate 11/09/2014  . Lung nodule, solitary 11/09/2014  . Mild cognitive disorder 11/09/2014  . TI (tricuspid incompetence) 11/09/2014    Past Surgical History:  Procedure Laterality Date  . ENDARTERECTOMY Left    Carotid  . KNEE SURGERY Right   . PARTIAL COLECTOMY     with Anastomosis  . SHOULDER SURGERY Right     Prior to Admission medications   Medication Sig Start Date End Date Taking? Authorizing Provider  hydrochlorothiazide (HYDRODIURIL) 12.5 MG tablet Take 1 tablet (12.5 mg total) by mouth daily. TAKE 1 TABLET BY MOUTH DAILY 09/03/19  Yes Sowles, Drue Stager, MD  Cyanocobalamin (VITAMIN B 12) 100 MCG LOZG Take 100 mcg by mouth daily.     [provider]  dabigatran (PRADAXA) 75 MG CAPS capsule Take 2 tablets by mouth 2 (two) times daily.    [provider]  diltiazem (CARTIA XT) 240 MG 24 hr capsule Take 1 capsule by mouth daily. 11/08/16   [provider]  glucose blood (FREESTYLE LITE) test strip TEST BLOOD SUGAR THREE TIMES DAILY AS DIRECTED.. 11/11/18   Sowles,  Drue Stager, MD  hydrALAZINE (APRESOLINE) 50 MG tablet Take 1 tablet by mouth 3 (three) times daily.     [provider]  Insulin Pen Needle (B-D ULTRAFINE III SHORT PEN) 31G X 8 MM MISC USE DAILY WITH LANTUS 03/04/19   Sowles, Drue Stager, MD  LANTUS SOLOSTAR 100 UNIT/ML Solostar Pen INJECT 8 UNITS UNDER THE SKIN DAILY Patient taking differently: Inject 6 Units into the skin daily.  08/26/19   Sowles, Drue Stager, MD   lisinopril (ZESTRIL) 20 MG tablet TAKE 1 TABLET(20 MG) BY MOUTH TWICE DAILY 08/24/19   Ancil Boozer, Drue Stager, MD  memantine (NAMENDA) 5 MG tablet Take 1 tablet (5 mg total) by mouth 2 (two) times daily. 09/03/19   Steele Sizer, MD  mirtazapine (REMERON) 15 MG tablet Take 1 tablet (15 mg total) by mouth at bedtime. 09/03/19   Steele Sizer, MD  potassium chloride SA (KLOR-CON) 20 MEQ tablet Take 1 tablet (20 mEq total) by mouth daily. 09/08/19   Steele Sizer, MD  rosuvastatin (CRESTOR) 40 MG tablet TAKE 1 TABLET BY MOUTH DAILY 07/30/19   Steele Sizer, MD    Allergies Aspirin  Family History  Problem Relation Age of Onset  . Alcohol abuse Brother   . Diabetes Daughter     Social History Social History   Tobacco Use  . Smoking status: Former Smoker    Packs/day: 1.00    Years: 10.00    Pack years: 10.00    Types: Cigarettes    Quit date: 05/15/1988    Years since quitting: 31.3  . Smokeless tobacco: Never Used  . Tobacco comment: smoking cessation materials not required  Substance Use Topics  . Alcohol use: No    Alcohol/week: 0.0 standard drinks    Comment: used to be a heavy drinker but quit 40 years ago   . Drug use: Not Currently    Types: Marijuana    Comment: many years ago     Review of Systems Level 5 caveat: Unable to obtain review of systems due to dementia    ____________________________________________   PHYSICAL EXAM:  VITAL SIGNS: ED Triage Vitals  Enc Vitals Group     BP 09/29/19 0833 (!) 168/82     Pulse Rate 09/29/19 0833 90     Resp 09/29/19 0833 16     Temp 09/29/19 0833 98.5 F (36.9 C)     Temp Source 09/29/19 0833 Oral     SpO2 09/29/19 0833 100 %     Weight 09/29/19 0834 143 lb 8.3 oz (65.1 kg)     Height 09/29/19 0834 5\' 8"  (1.727 m)     Head Circumference --      Peak Flow --      Pain Score 09/29/19 0834 0     Pain Loc --      Pain Edu? --      Excl. in [place]? --     Constitutional: Alert, oriented x2.  Relatively comfortable  appearing, in no acute distress. Eyes: Conjunctivae are normal.  EOMI.  PERRLA. Head: Atraumatic. Nose: No congestion/rhinnorhea. Mouth/Throat: Mucous membranes are moist.   Neck: Normal range of motion.  No midline cervical spinal tenderness. Cardiovascular: Normal rate, regular rhythm.   Good peripheral circulation. Respiratory: Normal respiratory effort.  No retractions.  Gastrointestinal: Soft and nontender. No distention.  Genitourinary: No flank tenderness. Musculoskeletal: Extremities warm and well perfused.  Full range of motion of the right knee with no bony tenderness, crepitus, or clicking. Neurologic:  Normal speech and language.  Motor  intact in all extremities.  Normal coordination. Skin:  Skin is warm and dry. No rash noted. Psychiatric: Calm and cooperative.  ____________________________________________   LABS (all labs ordered are listed, but only abnormal results are displayed)  Labs Reviewed  COMPREHENSIVE METABOLIC PANEL - Abnormal; Notable for the following components:      Result Value   Potassium 2.5 (*)    Glucose, Bld 132 (*)    Creatinine, Ser 1.35 (*)    Calcium 8.7 (*)    GFR calc non Af Amer 49 (*)    GFR calc Af Amer 56 (*)    All other components within normal limits  CBC - Abnormal; Notable for the following components:   RBC 3.45 (*)    Hemoglobin 10.6 (*)    HCT 31.7 (*)    RDW 15.7 (*)    All other components within normal limits  URINALYSIS, COMPLETE (UACMP) WITH MICROSCOPIC - Abnormal; Notable for the following components:   Color, Urine STRAW (*)    APPearance CLEAR (*)    Hgb urine dipstick SMALL (*)    Protein, ur 100 (*)    All other components within normal limits  TROPONIN I (HIGH SENSITIVITY) - Abnormal; Notable for the following components:   Troponin I (High Sensitivity) 58 (*)    All other components within normal limits  SARS CORONAVIRUS 2 BY RT PCR (HOSPITAL ORDER, [place] [facility])  TROPONIN I (HIGH  SENSITIVITY)   ____________________________________________  EKG  ED ECG REPORT I, Arta Silence, the attending physician, personally viewed and interpreted this ECG.  Date: 09/29/2019 EKG Time: 1308 Rate: 98 Rhythm: Sinus rhythm with frequent PVCs QRS Axis: normal Intervals: normal ST/T Wave abnormalities: Nonspecific ST abnormalities Narrative Interpretation: Nonspecific abnormalities with no evidence of acute ischemia  ____________________________________________  RADIOLOGY  CT head: No ICH or other acute abnormalities XR R knee: Pending ____________________________________________   PROCEDURES  Procedure(s) performed: No  Procedures  Critical Care performed: No ____________________________________________   INITIAL IMPRESSION / ASSESSMENT AND PLAN / ED COURSE  Pertinent labs & imaging results that were available during my care of the patient were reviewed by me and considered in my medical decision making (see chart for details).  84 year old male with PMH as noted above including CKD, dementia, diabetes presents after he wandered from the home today and was noted to have fallen.  The patient reports that his right knee sometimes pops, but denies any pain currently.  It is not clear if he hit his head or what happened when he fell, but he was ambulatory afterwards.  The daughter also reports that he has become increasingly confused recently.  He started on 2 new medications for dementia in the last couple of weeks.  They feel that he needs more advanced care and are looking into placement options.  I reviewed the past medical records in [place].  The patient has no recent prior ED visits or admissions here.  I discussed the patient's case with his daughter who provided much of the history.  She lives with him currently.  On exam he is alert and oriented x2.  His vital signs are normal except for hypertension.  Neurologic exam is nonfocal and there is no visible  trauma.  He has full range of motion of the right knee.  Initial lab work-up reveals significant hypokalemia.  He apparently has a history of hypokalemia, but this is an acute change.  The daughter reports that he has not been able to  tolerate p.o. potassium in the past, so has tried to change his diet.  1.  Possible fall: Patient has no visible trauma.  Given the possibility of head injury and the patient's recent change in mental status, we will obtain a CT head as well as an x-ray of the right knee.  Other trauma is ruled out based on exam.  2.  Change in mental status: Hypokalemia may be contributing.  Differential includes UTI or other infection, dehydration or other metabolic cause, or less likely cardiac or CNS etiology.  We will obtain CT head as above, troponin, urinalysis, and reassess.  Given the hypokalemia and inability to tolerate p.o., as well as the patient's change in mental status, we will anticipate admission.  ----------------------------------------- 2:06 PM on 09/29/2019 -----------------------------------------  CT head shows no acute findings.  Lab work-up is significant for elevated troponin of unclear etiology.  The patient has no chest pain or significant EKG changes.  We will plan to trend this.  I discussed the case with the hospitalist for admission.  ____________________________  [name] was evaluated in Emergency Department on 09/29/2019 for the symptoms described in the history of present illness. He was evaluated in the context of the global COVID-19 pandemic, which necessitated consideration that the patient might be at risk for infection with the SARS-CoV-2 virus that causes COVID-19. Institutional protocols and algorithms that pertain to the evaluation of patients at risk for COVID-19 are in a state of rapid change based on information released by regulatory bodies including the CDC and federal and state organizations. These policies and algorithms  were followed during the patient's care in the ED. ____________________________________________   FINAL CLINICAL IMPRESSION(S) / ED DIAGNOSES  Final diagnoses:  Hypokalemia  Elevated troponin      NEW MEDICATIONS STARTED DURING THIS VISIT:  New Prescriptions   No medications on file     Note:  This document was prepared using Dragon voice recognition software and may include unintentional dictation errors.     Arta Silence, MD 09/29/19 1407

## 2019-09-29 NOTE — ED Notes (Signed)
Pt is in bed at this time. States he has no pants and that he needs a friend to come to bring him pants before he walks home. Pt unable to state where he is. Correct name and birthday.

## 2019-09-29 NOTE — H&P (Addendum)
History and Physical    Justin Manning TML:[id number] DOB: 27-Dec-1935 DOA: 09/29/2019  PCP: Steele Sizer, MD  Patient coming from: home   Chief Complaint: fall  HPI: Justin Manning is a 84 y.o. male with medical history significant of chronic kidney disease, dementia, diabetes mellitus, hypertension, atrial fibrillation on Pradaxa had left his house earlier this morning morning around and a neighbor saw him and called the daughter. Apparently patient had fallen. Pt does not remember anything as he is demented. History was given by Er and daughter who I spoke to via phone. Daughter states patient needs skilled nursing. Patient is confused but interactive with me. Denies chest pain, sob, abd pain. ED Course: In the ER patient was found with potassium of 2.5, creatinine 1, glucose 132, creatinine of 14.  Troponin was initially 58 and has increased to 111.  WBC is 10.2, hematocrit 31.7 and MCV of 91.9 and hematocrit of 31.7. Tele: afib.  CT of the head no acute intracranial findings.  Chronic microvascular ischemic changes and cerebral volume loss. Review of Systems: All systems reviewed and otherwise negative.    Past Medical History:  Diagnosis Date  . Anemia   . CKD (chronic kidney disease), stage III    Dr. Juleen China  . Dementia ([place])   . Diabetes mellitus without complication ([place])   . GERD (gastroesophageal reflux disease)   . Hyperlipidemia   . Hypertension   . Mitral regurgitation    Mild    Past Surgical History:  Procedure Laterality Date  . ENDARTERECTOMY Left    Carotid  . KNEE SURGERY Right   . PARTIAL COLECTOMY     with Anastomosis  . SHOULDER SURGERY Right      reports that he quit smoking about 31 years ago. His smoking use included cigarettes. He has a 10.00 pack-year smoking history. He has never used smokeless tobacco. He reports previous drug use. Drug: Marijuana. He reports that he does not drink alcohol.  Allergies  Allergen Reactions  . Aspirin     tachycardia    Family History  Problem Relation Age of Onset  . Alcohol abuse Brother   . Diabetes Daughter      Prior to Admission medications   Medication Sig Start Date End Date Taking? Authorizing Provider  Cholecalciferol 50 MCG (2000 UT) CAPS Take 2,000 Units by mouth 2 (two) times daily.    Yes [provider]  Cyanocobalamin (VITAMIN B 12) 100 MCG LOZG Take 100 mcg by mouth daily.    Yes [provider]  dabigatran (PRADAXA) 75 MG CAPS capsule Take 75 mg by mouth 2 (two) times daily.    Yes [provider]  diltiazem (CARTIA XT) 240 MG 24 hr capsule Take 1 capsule by mouth daily. 11/08/16  Yes [provider]  hydrALAZINE (APRESOLINE) 50 MG tablet Take 1 tablet by mouth 3 (three) times daily.    Yes [provider]  hydrochlorothiazide (HYDRODIURIL) 12.5 MG tablet Take 1 tablet (12.5 mg total) by mouth daily. TAKE 1 TABLET BY MOUTH DAILY 09/03/19  Yes Sowles, Drue Stager, MD  LANTUS SOLOSTAR 100 UNIT/ML Solostar Pen INJECT 8 UNITS UNDER THE SKIN DAILY Patient taking differently: Inject 6 Units into the skin daily.  08/26/19  Yes Sowles, Drue Stager, MD  lisinopril (ZESTRIL) 20 MG tablet TAKE 1 TABLET(20 MG) BY MOUTH TWICE DAILY 08/24/19  Yes Sowles, Drue Stager, MD  memantine (NAMENDA) 5 MG tablet Take 1 tablet (5 mg total) by mouth 2 (two) times daily. 09/03/19  Yes Steele Sizer, MD  mirtazapine (REMERON) 15 MG tablet Take 1 tablet (15 mg total) by mouth at bedtime. 09/03/19  Yes Sowles, Drue Stager, MD  rosuvastatin (CRESTOR) 40 MG tablet TAKE 1 TABLET BY MOUTH DAILY 07/30/19  Yes Sowles, Drue Stager, MD  glucose blood (FREESTYLE LITE) test strip TEST BLOOD SUGAR THREE TIMES DAILY AS DIRECTED.. 11/11/18   Steele Sizer, MD  Insulin Pen Needle (B-D ULTRAFINE III SHORT PEN) 31G X 8 MM MISC USE DAILY WITH LANTUS 03/04/19   Sowles, Drue Stager, MD  potassium chloride SA (KLOR-CON) 20 MEQ tablet Take 1 tablet (20 mEq total) by mouth daily. Patient not taking:  Reported on 09/29/2019 09/08/19   Steele Sizer, MD    Physical Exam: Vitals:   09/29/19 0834 09/29/19 1133 09/29/19 1134 09/29/19 1200  BP:  (!) 194/80  (!) 166/89  Pulse:  62 (!) 59 84  Resp:   16 12  Temp:      TempSrc:      SpO2:  99% 99% 99%  Weight: 65.1 kg     Height: 5\' 8"  (1.727 m)       Constitutional: NAD, calm, comfortable Vitals:   09/29/19 0834 09/29/19 1133 09/29/19 1134 09/29/19 1200  BP:  (!) 194/80  (!) 166/89  Pulse:  62 (!) 59 84  Resp:   16 12  Temp:      TempSrc:      SpO2:  99% 99% 99%  Weight: 65.1 kg     Height: 5\' 8"  (1.727 m)      Eyes: , EOMI ENMT: Mucous membranes are moist.  Neck: normal, supple Respiratory: clear to auscultation bilaterally, no wheezing, no crackles. Normal respiratory effort. No accessory muscle use.  Cardiovascular: Regular rate and rhythm, no murmurs / rubs / gallops. No extremity edema.  Abdomen: no tenderness, no masses palpated.Bowel sounds positive.  Musculoskeletal: no clubbing / cyanosis.  Skin: Warm dry Neurologic: Demented CN 2-12 grossly intact.  Psychiatric: Alert and oriented x to person only. Normal mood.    Labs on Admission: I have personally reviewed following labs and imaging studies  CBC: Recent Labs  Lab 09/29/19 0835  WBC 10.2  HGB 10.6*  HCT 31.7*  MCV 91.9  PLT 166   Basic Metabolic Panel: Recent Labs  Lab 09/29/19 0835  NA 144  K 2.5*  CL 110  CO2 26  GLUCOSE 132*  BUN 14  CREATININE 1.35*  CALCIUM 8.7*   GFR: Estimated Creatinine Clearance: 38.8 mL/min (A) (by C-G formula based on SCr of 1.35 mg/dL (H)). Liver Function Tests: Recent Labs  Lab 09/29/19 0835  AST 26  ALT 17  ALKPHOS 56  BILITOT 0.6  PROT 6.9  ALBUMIN 3.6   No results for input(s): LIPASE, AMYLASE in the last 168 hours. No results for input(s): AMMONIA in the last 168 hours. Coagulation Profile: No results for input(s): INR, PROTIME in the last 168 hours. Cardiac Enzymes: No results for input(s):  CKTOTAL, CKMB, CKMBINDEX, TROPONINI in the last 168 hours. BNP (last 3 results) No results for input(s): PROBNP in the last 8760 hours. HbA1C: No results for input(s): HGBA1C in the last 72 hours. CBG: No results for input(s): GLUCAP in the last 168 hours. Lipid Profile: No results for input(s): CHOL, HDL, LDLCALC, TRIG, CHOLHDL, LDLDIRECT in the last 72 hours. Thyroid Function Tests: No results for input(s): TSH, T4TOTAL, FREET4, T3FREE, THYROIDAB in the last 72 hours. Anemia Panel: No results for input(s): VITAMINB12, FOLATE, FERRITIN, TIBC, IRON, RETICCTPCT in the last  72 hours. Urine analysis:    Component Value Date/Time   COLORURINE STRAW (A) 09/29/2019 0835   APPEARANCEUR CLEAR (A) 09/29/2019 0835   APPEARANCEUR [place] 01/09/2013 1604   LABSPEC 1.008 09/29/2019 0835   LABSPEC 1.014 01/09/2013 1604   PHURINE 6.0 09/29/2019 0835   GLUCOSEU NEGATIVE 09/29/2019 0835   GLUCOSEU 50 mg/dL 01/09/2013 1604   HGBUR SMALL (A) 09/29/2019 0835   BILIRUBINUR NEGATIVE 09/29/2019 0835   BILIRUBINUR neg 03/25/2019 1056   BILIRUBINUR Negative 01/09/2013 [place] 09/29/2019 0835   PROTEINUR 100 (A) 09/29/2019 0835   UROBILINOGEN 1.0 03/25/2019 1056   NITRITE NEGATIVE 09/29/2019 0835   LEUKOCYTESUR NEGATIVE 09/29/2019 0835   LEUKOCYTESUR Negative 01/09/2013 1604    Radiological Exams on Admission: DG Knee 2 Views Right  Result Date: 09/29/2019 CLINICAL DATA:  Fall this morning. Right knee pain. Initial encounter. EXAM: RIGHT KNEE - 2 VIEW COMPARISON:  None. FINDINGS: No evidence of fracture, dislocation, or joint effusion. Total knee arthroplasty is seen with all 3 components in expected position. Extensive peripheral vascular calcification noted. IMPRESSION: No acute findings. Electronically Signed   By: Marlaine Hind M.D.   On: 09/29/2019 14:26   CT Head Wo Contrast  Result Date: 09/29/2019 CLINICAL DATA:  Altered mental status EXAM: CT HEAD WITHOUT CONTRAST TECHNIQUE:  Contiguous axial images were obtained from the base of the skull through the vertex without intravenous contrast. COMPARISON:  01/09/2013 FINDINGS: Brain: No evidence of acute infarction, hemorrhage, hydrocephalus, extra-axial collection or mass lesion/mass effect. Extensive low-density changes within the periventricular and subcortical white matter compatible with chronic microvascular ischemic change. Moderate diffuse cerebral volume loss. Vascular: Atherosclerotic calcifications involving the large vessels of the skull base. No unexpected hyperdense vessel. Skull: Normal. Negative for fracture or focal lesion. Sinuses/Orbits: Partially opacified bilateral mastoid air cells. Paranasal sinuses are clear. Orbital structures appear unremarkable. Other: None. IMPRESSION: 1. No acute intracranial findings. 2. Chronic microvascular ischemic change and cerebral volume loss. 3. Partially opacified bilateral mastoid air cells. Electronically Signed   By: Davina Poke D.O.   On: 09/29/2019 12:41    EKG: Independently reviewed.  A. fib, PVCs no ischemic ST changes  Assessment/Plan Active Problems:   Fall   1.s/p fall- CT head neg No fx. PT/OT  2. Hypokalemia- likely from HCtz.  It appears clinically has been low We will hold hydrochlorothiazide Aggressively replace potassium Monitor levels  3.afib - with intermittent rvr on tele Resume home cardizem.  Continue Pradaxa  4. Elevated tp- likely demand ischemia. No cp Ck echo Cards consulted   5.CKD IIIa - appears better than previous lab work Daughter asked for his nephrologist Dr. Juleen China to see patient while in-house.  6.Dementia- continue outpt meds  7. Hypertension-  Elevated currently. Will resume home medications except for hctz.  Monitor closely   DVT prophylaxis: pradaxa Code Status: full  Family Communication: spoke to daughter via phone  Disposition Plan: likely snf  Consults called: nephrology and cardiology  Admission  status: inpatient as pt requires >2 [place] stays.    Nolberto Hanlon MD [facility] Pager [phone]   If 7PM-7AM, please contact night-coverage [email or web address] Password [email or web address]  09/29/2019, 3:20 PM

## 2019-09-29 NOTE — Consult Note (Signed)
[facility] Consultation Note  Patient ID: Justin Manning, MRN: [id number], DOB/AGE: 1935/10/08 84 y.o. Admit date: 09/29/2019   Date of Consult: 09/29/2019 Primary Physician: Steele Sizer, MD Primary Cardiologist: Ubaldo Glassing  Chief Complaint:  Chief Complaint  Patient presents with  . ams   Reason for Consult: Atrial fibrillation with elevated troponin  HPI: 84 y.o. male with known diabetes hypertension hyperlipidemia paroxysmal nonvalvular atrial fibrillation chronic kidney disease with appropriate medication management for all above.  The patient was apparently at the barbershop and had some shortness of breath and other concerns and went out to the sidewalk and had some dizziness but was brought to the emergency room.  The patient did have some difficulty with his history and most of the history was achieved by discussing with the nurse and other people and EMS.  The patient since then has had atrial fibrillation with slightly rapid rate but currently controlled on his continue diltiazem oral dose.  The patient has not had any other significant chest pain dizziness or syncope since admitted to the emergency room.  Currently EKG shows atrial fibrillation with controlled ventricular rate preventricular contractions and nonspecific ST changes.  Troponin level elevated to 111 and then subsequently 58 most consistent with demand ischemia rather than acute coronary syndrome.  Chest x-ray shows no evidence of acute congestive heart failure or pulmonary edema.  Currently the patient feels relatively well and is hemodynamically stable with no further symptoms at this time  Past Medical History:  Diagnosis Date  . Anemia   . CKD (chronic kidney disease), stage III    Dr. Juleen China  . Dementia ([place])   . Diabetes mellitus without complication ([place])   . GERD (gastroesophageal reflux disease)   . Hyperlipidemia   . Hypertension   . Mitral regurgitation    Mild      Surgical History:   Past Surgical History:  Procedure Laterality Date  . ENDARTERECTOMY Left    Carotid  . KNEE SURGERY Right   . PARTIAL COLECTOMY     with Anastomosis  . SHOULDER SURGERY Right      Home Meds: Prior to Admission medications   Medication Sig Start Date End Date Taking? Authorizing Provider  Cholecalciferol 50 MCG (2000 UT) CAPS Take 2,000 Units by mouth 2 (two) times daily.    Yes [provider]  Cyanocobalamin (VITAMIN B 12) 100 MCG LOZG Take 100 mcg by mouth daily.    Yes [provider]  dabigatran (PRADAXA) 75 MG CAPS capsule Take 75 mg by mouth 2 (two) times daily.    Yes [provider]  diltiazem (CARTIA XT) 240 MG 24 hr capsule Take 1 capsule by mouth daily. 11/08/16  Yes [provider]  hydrALAZINE (APRESOLINE) 50 MG tablet Take 1 tablet by mouth 3 (three) times daily.    Yes [provider]  hydrochlorothiazide (HYDRODIURIL) 12.5 MG tablet Take 1 tablet (12.5 mg total) by mouth daily. TAKE 1 TABLET BY MOUTH DAILY 09/03/19  Yes Sowles, Drue Stager, MD  LANTUS SOLOSTAR 100 UNIT/ML Solostar Pen INJECT 8 UNITS UNDER THE SKIN DAILY Patient taking differently: Inject 6 Units into the skin daily.  08/26/19  Yes Sowles, Drue Stager, MD  lisinopril (ZESTRIL) 20 MG tablet TAKE 1 TABLET(20 MG) BY MOUTH TWICE DAILY 08/24/19  Yes Sowles, Drue Stager, MD  memantine (NAMENDA) 5 MG tablet Take 1 tablet (5 mg total) by mouth 2 (two) times daily. 09/03/19  Yes Sowles, Drue Stager, MD  mirtazapine (REMERON) 15 MG tablet Take 1  tablet (15 mg total) by mouth at bedtime. 09/03/19  Yes Sowles, Drue Stager, MD  rosuvastatin (CRESTOR) 40 MG tablet TAKE 1 TABLET BY MOUTH DAILY 07/30/19  Yes Sowles, Drue Stager, MD  glucose blood (FREESTYLE LITE) test strip TEST BLOOD SUGAR THREE TIMES DAILY AS DIRECTED.. 11/11/18   Steele Sizer, MD  Insulin Pen Needle (B-D ULTRAFINE III SHORT PEN) 31G X 8 MM MISC USE DAILY WITH LANTUS 03/04/19   Sowles, Drue Stager, MD  potassium chloride SA (KLOR-CON) 20 MEQ  tablet Take 1 tablet (20 mEq total) by mouth daily. Patient not taking: Reported on 09/29/2019 09/08/19   Steele Sizer, MD    Inpatient Medications:  . cholecalciferol  2,000 Units Oral BID  . dabigatran  75 mg Oral BID  . [START ON 09/30/2019] diltiazem  240 mg Oral Daily  . hydrALAZINE  50 mg Oral TID  . insulin glargine  6 Units Subcutaneous Q2000  . lisinopril  20 mg Oral Daily  . multivitamin with minerals  1 tablet Oral Daily  . rosuvastatin  40 mg Oral QHS  . vitamin B-12  100 mcg Oral Daily   . potassium chloride 10 mEq (09/29/19 1553)    Allergies:  Allergies  Allergen Reactions  . Aspirin     tachycardia    Social History   Socioeconomic History  . Marital status: Widowed    Spouse name: Janett Billow  . Number of children: 3  . Years of education: Not on file  . Highest education level: 11th grade  Occupational History  . Occupation: retired  Tobacco Use  . Smoking status: Former Smoker    Packs/day: 1.00    Years: 10.00    Pack years: 10.00    Types: Cigarettes    Quit date: 05/15/1988    Years since quitting: 31.3  . Smokeless tobacco: Never Used  . Tobacco comment: smoking cessation materials not required  Substance and Sexual Activity  . Alcohol use: No    Alcohol/week: 0.0 standard drinks    Comment: used to be a heavy drinker but quit 40 years ago   . Drug use: Not Currently    Types: Marijuana    Comment: many years ago   . Sexual activity: Not Currently  Other Topics Concern  . Not on file  Social History Narrative   He lives alone   Social Determinants of Health   Financial Resource Strain:   . Difficulty of Paying Living Expenses:   Food Insecurity:   . Worried About Charity fundraiser in the Last Year:   . Arboriculturist in the Last Year:   Transportation Needs:   . Film/video editor (Medical):   Marland Kitchen Lack of Transportation (Non-Medical):   Physical Activity:   . Days of Exercise per Week:   . Minutes of Exercise per Session:    Stress:   . Feeling of Stress :   Social Connections:   . Frequency of Communication with Friends and Family:   . Frequency of Social Gatherings with Friends and Family:   . Attends Religious Services:   . Active Member of Clubs or Organizations:   . Attends Archivist Meetings:   Marland Kitchen Marital Status:   Intimate Partner Violence:   . Fear of Current or Ex-Partner:   . Emotionally Abused:   Marland Kitchen Physically Abused:   . Sexually Abused:      Family History  Problem Relation Age of Onset  . Alcohol abuse Brother   . Diabetes Daughter  Review of Systems Positive for weakness fatigue Negative for: General:  chills, fever, night sweats or weight changes.  Cardiovascular: PND orthopnea syncope dizziness  Dermatological skin lesions rashes Respiratory: Cough congestion Urologic: Frequent urination urination at night and hematuria Abdominal: negative for nausea, vomiting, diarrhea, bright red blood per rectum, melena, or hematemesis Neurologic: negative for visual changes, and/or hearing changes  All other systems reviewed and are otherwise negative except as noted above.  Labs: No results for input(s): CKTOTAL, CKMB, TROPONINI in the last 72 hours. Lab Results  Component Value Date   WBC 10.2 09/29/2019   HGB 10.6 (L) 09/29/2019   HCT 31.7 (L) 09/29/2019   MCV 91.9 09/29/2019   PLT 207 09/29/2019    Recent Labs  Lab 09/29/19 0835  NA 144  K 2.5*  CL 110  CO2 26  BUN 14  CREATININE 1.35*  CALCIUM 8.7*  PROT 6.9  BILITOT 0.6  ALKPHOS 56  ALT 17  AST 26  GLUCOSE 132*   Lab Results  Component Value Date   CHOL 152 09/03/2019   HDL 62 09/03/2019   LDLCALC 77 09/03/2019   TRIG 51 09/03/2019   No results found for: DDIMER  Radiology/Studies:  DG Knee 2 Views Right  Result Date: 09/29/2019 CLINICAL DATA:  Fall this morning. Right knee pain. Initial encounter. EXAM: RIGHT KNEE - 2 VIEW COMPARISON:  None. FINDINGS: No evidence of fracture, dislocation,  or joint effusion. Total knee arthroplasty is seen with all 3 components in expected position. Extensive peripheral vascular calcification noted. IMPRESSION: No acute findings. Electronically Signed   By: Marlaine Hind M.D.   On: 09/29/2019 14:26   CT Head Wo Contrast  Result Date: 09/29/2019 CLINICAL DATA:  Altered mental status EXAM: CT HEAD WITHOUT CONTRAST TECHNIQUE: Contiguous axial images were obtained from the base of the skull through the vertex without intravenous contrast. COMPARISON:  01/09/2013 FINDINGS: Brain: No evidence of acute infarction, hemorrhage, hydrocephalus, extra-axial collection or mass lesion/mass effect. Extensive low-density changes within the periventricular and subcortical white matter compatible with chronic microvascular ischemic change. Moderate diffuse cerebral volume loss. Vascular: Atherosclerotic calcifications involving the large vessels of the skull base. No unexpected hyperdense vessel. Skull: Normal. Negative for fracture or focal lesion. Sinuses/Orbits: Partially opacified bilateral mastoid air cells. Paranasal sinuses are clear. Orbital structures appear unremarkable. Other: None. IMPRESSION: 1. No acute intracranial findings. 2. Chronic microvascular ischemic change and cerebral volume loss. 3. Partially opacified bilateral mastoid air cells. Electronically Signed   By: Davina Poke D.O.   On: 09/29/2019 12:41    EKG: Atrial fibrillation with controlled ventricular rate preventricular contractions with nonspecific ST and T wave changes  Weights: Filed Weights   09/29/19 0834  Weight: 65.1 kg     Physical Exam: Blood pressure (!) 127/111, pulse 96, temperature 98.5 F (36.9 C), temperature source Oral, resp. rate 20, height 5\' 8"  (1.727 m), weight 65.1 kg, SpO2 96 %. Body mass index is 21.82 kg/m. General: Well developed, well nourished, in no acute distress. Head eyes ears nose throat: Normocephalic, atraumatic, sclera non-icteric, no xanthomas,  nares are without discharge. No apparent thyromegaly and/or mass  Lungs: Normal respiratory effort.  no wheezes, few basilar rales, no rhonchi.  Heart: Irregular with normal S1 S2. no murmur gallop, no rub, PMI is normal size and placement, carotid upstroke normal without bruit, jugular venous pressure is normal Abdomen: Soft, non-tender, non-distended with normoactive bowel sounds. No hepatomegaly. No rebound/guarding. No obvious abdominal masses. Abdominal aorta is normal size without  bruit Extremities: Trace edema. no cyanosis, no clubbing, no ulcers  Peripheral : 2+ bilateral upper extremity pulses, 2+ bilateral femoral pulses, 2+ bilateral dorsal pedal pulse Neuro: Not alert and oriented. No facial asymmetry. No focal deficit. Moves all extremities spontaneously. Musculoskeletal: Normal muscle tone without kyphosis Psych: Does not responds to questions appropriately with a normal affect.    Assessment: 84 year old male with chronic kidney disease diabetes hypertension hyperlipidemia and paroxysmal nonvalvular atrial fibrillation on appropriate previous appropriate medication management with atrial fibrillation with rapid ventricular rate now more controlled in the ED in the emergency room with a slight elevation of troponin consistent with demand ischemia rather than acute coronary syndrome and no current evidence of congestive heart failure  Plan: 1.  Continue investigation disorientation and concerns for the possibility of neurologic abnormalities 2.  Continue oral medication management including diltiazem for heart rate control without change at 240 mg each day 3.  Continue anticoagulation for further risk reduction stroke with atrial fibrillation with Pradaxa without change in dosage of medication 4.  No change in hypertension control with the lisinopril diltiazem hydralazine combination 5.  No further cardiac diagnostics necessary at this time due to no evidence of congestive heart  failure or anginal equivalent at this time with no evidence of myocardial infarction but troponin elevation most consistent with demand ischemia 6.  High intensity cholesterol therapy for further risk reduction cardiovascular event 7.  Begin ambulation and follow-up for improvements of symptoms and other adjustments of medications  Signed, Corey Skains M.D. [facility] 09/29/2019, 4:43 PM

## 2019-09-29 NOTE — Progress Notes (Signed)
[facility]  ROUNDING NOTE   Subjective:   [name] admitted to [facility] on 09/29/2019 for Fall [W19.XXXA]  Nephrology consulted for continuity of care. Patient follows with me as as outpatient and was last seen on 09/15/2019.   History taken with assistance of daughter who is available on the phone. Patient was recently started on both mirtazepine and memantine.   Objective:  Vital signs in last 24 hours:  Temp:  [98.5 F (36.9 C)] 98.5 F (36.9 C) (05/17 0833) Pulse Rate:  [52-148] 96 (05/17 1519) Resp:  [12-23] 20 (05/17 1519) BP: (161-195)/(66-105) 195/66 (05/17 1524) SpO2:  [96 %-100 %] 96 % (05/17 1519) Weight:  [65.1 kg] 65.1 kg (05/17 0834)  Weight change:  Filed Weights   09/29/19 0834  Weight: 65.1 kg    Intake/Output: No intake/output data recorded.   Intake/Output this shift:  Total I/O In: 687.5 [IV Piggyback:687.5] Out: -   Physical Exam: General: NAD,   Head: Normocephalic, atraumatic. Moist oral mucosal membranes  Eyes: Anicteric, PERRL  Neck: Supple, trachea midline  Lungs:  Clear to auscultation  Heart: Regular rate and rhythm  Abdomen:  Soft, nontender,   Extremities:  no peripheral edema.  Neurologic: Not alert to situation  Skin: No lesions        Basic Metabolic Panel: Recent Labs  Lab 09/29/19 0835  NA 144  K 2.5*  CL 110  CO2 26  GLUCOSE 132*  BUN 14  CREATININE 1.35*  CALCIUM 8.7*    Liver Function Tests: Recent Labs  Lab 09/29/19 0835  AST 26  ALT 17  ALKPHOS 56  BILITOT 0.6  PROT 6.9  ALBUMIN 3.6   No results for input(s): LIPASE, AMYLASE in the last 168 hours. No results for input(s): AMMONIA in the last 168 hours.  CBC: Recent Labs  Lab 09/29/19 0835  WBC 10.2  HGB 10.6*  HCT 31.7*  MCV 91.9  PLT 207    Cardiac Enzymes: No results for input(s): CKTOTAL, CKMB, CKMBINDEX, TROPONINI in the last 168 hours.  BNP: Invalid input(s): POCBNP  CBG: No results for input(s): GLUCAP in  the last 168 hours.  Microbiology: Results for orders placed or performed during the hospital encounter of 09/29/19  SARS Coronavirus 2 by RT PCR (hospital order, performed in [facility] hospital lab) Nasopharyngeal Nasopharyngeal Swab     Status: None   Collection Time: 09/29/19  1:10 PM   Specimen: Nasopharyngeal Swab  Result Value Ref Range Status   SARS Coronavirus 2 NEGATIVE NEGATIVE Final    Comment: (NOTE) SARS-CoV-2 target nucleic acids are NOT DETECTED. The SARS-CoV-2 RNA is generally detectable in upper and lower respiratory specimens during the acute phase of infection. The lowest concentration of SARS-CoV-2 viral copies this assay can detect is 250 copies / mL. A negative result does not preclude SARS-CoV-2 infection and should not be used as the sole basis for treatment or other patient management decisions.  A negative result may occur with improper specimen collection / handling, submission of specimen other than nasopharyngeal swab, presence of viral mutation(s) within the areas targeted by this assay, and inadequate number of viral copies (<250 copies / mL). A negative result must be combined with clinical observations, patient history, and epidemiological information. Fact Sheet for Patients:   StrictlyIdeas.no Fact Sheet for Healthcare Providers: BankingDealers.co.za This test is not yet approved or cleared  by the [place] FDA and has been authorized for detection and/or diagnosis of SARS-CoV-2 by FDA under an  Emergency Use Authorization (EUA).  This EUA will remain in effect (meaning this test can be used) for the duration of the COVID-19 declaration under Section 564(b)(1) of the Act, 21 U.S.C. section 360bbb-3(b)(1), unless the authorization is terminated or revoked sooner. Performed at [facility], [address] [place], [place] [address]     Coagulation Studies: No results for input(s):  LABPROT, INR in the last 72 hours.  Urinalysis: Recent Labs    09/29/19 0835  COLORURINE STRAW*  LABSPEC 1.008  PHURINE 6.0  GLUCOSEU NEGATIVE  HGBUR SMALL*  BILIRUBINUR NEGATIVE  KETONESUR NEGATIVE  PROTEINUR 100*  NITRITE NEGATIVE  LEUKOCYTESUR NEGATIVE      Imaging: DG Knee 2 Views Right  Result Date: 09/29/2019 CLINICAL DATA:  Fall this morning. Right knee pain. Initial encounter. EXAM: RIGHT KNEE - 2 VIEW COMPARISON:  None. FINDINGS: No evidence of fracture, dislocation, or joint effusion. Total knee arthroplasty is seen with all 3 components in expected position. Extensive peripheral vascular calcification noted. IMPRESSION: No acute findings. Electronically Signed   By: Marlaine Hind M.D.   On: 09/29/2019 14:26   CT Head Wo Contrast  Result Date: 09/29/2019 CLINICAL DATA:  Altered mental status EXAM: CT HEAD WITHOUT CONTRAST TECHNIQUE: Contiguous axial images were obtained from the base of the skull through the vertex without intravenous contrast. COMPARISON:  01/09/2013 FINDINGS: Brain: No evidence of acute infarction, hemorrhage, hydrocephalus, extra-axial collection or mass lesion/mass effect. Extensive low-density changes within the periventricular and subcortical white matter compatible with chronic microvascular ischemic change. Moderate diffuse cerebral volume loss. Vascular: Atherosclerotic calcifications involving the large vessels of the skull base. No unexpected hyperdense vessel. Skull: Normal. Negative for fracture or focal lesion. Sinuses/Orbits: Partially opacified bilateral mastoid air cells. Paranasal sinuses are clear. Orbital structures appear unremarkable. Other: None. IMPRESSION: 1. No acute intracranial findings. 2. Chronic microvascular ischemic change and cerebral volume loss. 3. Partially opacified bilateral mastoid air cells. Electronically Signed   By: Davina Poke D.O.   On: 09/29/2019 12:41     Medications:   . potassium chloride     .  cholecalciferol  2,000 Units Oral BID  . dabigatran  75 mg Oral BID  . [START ON 09/30/2019] diltiazem  240 mg Oral Daily  . hydrALAZINE  50 mg Oral TID  . insulin glargine  6 Units Subcutaneous Q2000  . lisinopril  20 mg Oral Daily  . memantine  5 mg Oral BID  . mirtazapine  15 mg Oral QHS  . multivitamin with minerals  1 tablet Oral Daily  . potassium chloride  40 mEq Oral Once  . rosuvastatin  40 mg Oral QHS  . vitamin B-12  100 mcg Oral Daily   bisacodyl, ondansetron **OR** ondansetron (ZOFRAN) IV, senna-docusate  Assessment/ Plan:  [name] is a 84 y.o. black male with hypertension, atrial fibrillation, diabetes mellitus type II insulin requiring, BPH who is admitted to [facility] on 09/29/2019 for Fall [W19.XXXA]  1. Chronic Kidney Disease stage IIIB:with proteinuria: baseline creatinine of 1.46, GFR of 44 on 09/23/19.  Chronic kidney disease secondary to diabetic nephropathy.   2. Hypertension: urgency on admission. Home regimen of hydralazine, hydrochlorothiazide, lisinopril, diltiazem.  Recommend restarting all agents except hydrochlorothiazide.   3. Hypokalemia: history of hyponatremia secondary to hydrochlorothiazide. Recommend discontinuation of this agent at this time.  - lisinopril and potassium replacement.   4. Delirium: could be due to progression of dementia versus acute from new medications: memantine and/or mirtazapine.   Discussed case over  the phone with daughter, Justin Manning.    LOS: 0 Siarra Gilkerson 5/17/20213:48 PM

## 2019-09-30 DIAGNOSIS — W19XXXD Unspecified fall, subsequent encounter: Secondary | ICD-10-CM | POA: Diagnosis not present

## 2019-09-30 DIAGNOSIS — R778 Other specified abnormalities of plasma proteins: Secondary | ICD-10-CM | POA: Diagnosis not present

## 2019-09-30 DIAGNOSIS — F0390 Unspecified dementia without behavioral disturbance: Secondary | ICD-10-CM

## 2019-09-30 DIAGNOSIS — E876 Hypokalemia: Secondary | ICD-10-CM | POA: Diagnosis not present

## 2019-09-30 DIAGNOSIS — I4891 Unspecified atrial fibrillation: Secondary | ICD-10-CM

## 2019-09-30 LAB — BASIC METABOLIC PANEL
Anion gap: 9 (ref 5–15)
BUN: 14 mg/dL (ref 8–23)
CO2: 25 mmol/L (ref 22–32)
Calcium: 9.1 mg/dL (ref 8.9–10.3)
Chloride: 109 mmol/L (ref 98–111)
Creatinine, Ser: 1.46 mg/dL — ABNORMAL HIGH (ref 0.61–1.24)
GFR calc Af Amer: 51 mL/min — ABNORMAL LOW (ref >60)
GFR calc non Af Amer: 44 mL/min — ABNORMAL LOW (ref >60)
Glucose, Bld: 136 mg/dL — ABNORMAL HIGH (ref 70–99)
Potassium: 2.7 mmol/L — CL (ref 3.5–5.1)
Sodium: 143 mmol/L (ref 135–145)

## 2019-09-30 LAB — CBC
HCT: 31.6 % — ABNORMAL LOW (ref 39.0–52.0)
Hemoglobin: 10.6 g/dL — ABNORMAL LOW (ref 13.0–17.0)
MCH: 31.3 pg (ref 26.0–34.0)
MCHC: 33.5 g/dL (ref 30.0–36.0)
MCV: 93.2 fL (ref 80.0–100.0)
Platelets: 209 10*3/uL (ref 150–400)
RBC: 3.39 MIL/uL — ABNORMAL LOW (ref 4.22–5.81)
RDW: 15.9 % — ABNORMAL HIGH (ref 11.5–15.5)
WBC: 8.1 10*3/uL (ref 4.0–10.5)
nRBC: 0 % (ref 0.0–0.2)

## 2019-09-30 LAB — ECHOCARDIOGRAM COMPLETE
Height: 68 in
Weight: 2296.31 oz

## 2019-09-30 LAB — MAGNESIUM: Magnesium: 1.8 mg/dL (ref 1.7–2.4)

## 2019-09-30 LAB — GLUCOSE, CAPILLARY
Glucose-Capillary: 155 mg/dL — ABNORMAL HIGH (ref 70–99)
Glucose-Capillary: 112 mg/dL — ABNORMAL HIGH (ref 70–99)
Glucose-Capillary: 118 mg/dL — ABNORMAL HIGH (ref 70–99)

## 2019-09-30 MED ORDER — POTASSIUM CHLORIDE 10 MEQ/100ML IV SOLN
10.0000 meq | INTRAVENOUS | Status: AC
  Administered 2019-09-30 (×6): 10 meq via INTRAVENOUS
  Filled 2019-09-30 (×3): qty 100

## 2019-09-30 MED ORDER — INSULIN ASPART 100 UNIT/ML ~~LOC~~ SOLN
0.0000 [IU] | SUBCUTANEOUS | Status: DC
  Administered 2019-10-01 (×2): 1 [IU] via SUBCUTANEOUS
  Administered 2019-10-01: 2 [IU] via SUBCUTANEOUS
  Administered 2019-10-02: 3 [IU] via SUBCUTANEOUS
  Administered 2019-10-02: 20:00:00 2 [IU] via SUBCUTANEOUS
  Administered 2019-10-03 (×2): 1 [IU] via SUBCUTANEOUS
  Administered 2019-10-03: 3 [IU] via SUBCUTANEOUS
  Administered 2019-10-03 – 2019-10-04 (×2): 2 [IU] via SUBCUTANEOUS
  Administered 2019-10-04 (×2): 1 [IU] via SUBCUTANEOUS
  Administered 2019-10-05: 2 [IU] via SUBCUTANEOUS
  Administered 2019-10-05 (×2): 1 [IU] via SUBCUTANEOUS
  Filled 2019-09-30 (×15): qty 1

## 2019-09-30 MED ORDER — POTASSIUM CHLORIDE CRYS ER 20 MEQ PO TBCR
40.0000 meq | EXTENDED_RELEASE_TABLET | Freq: Once | ORAL | Status: AC
  Administered 2019-09-30: 40 meq via ORAL
  Filled 2019-09-30: qty 2

## 2019-09-30 MED ORDER — POTASSIUM CHLORIDE CRYS ER 20 MEQ PO TBCR: 20.0000 meq | EXTENDED_RELEASE_TABLET | Freq: Every day | ORAL | Status: DC

## 2019-09-30 MED ORDER — MEMANTINE HCL 5 MG PO TABS
5.0000 mg | ORAL_TABLET | Freq: Two times a day (BID) | ORAL | Status: DC
  Administered 2019-09-30 – 2019-10-06 (×13): 5 mg via ORAL
  Filled 2019-09-30 (×12): qty 1

## 2019-09-30 MED ORDER — ISOSORBIDE MONONITRATE ER 30 MG PO TB24
30.0000 mg | ORAL_TABLET | Freq: Every day | ORAL | Status: DC
  Administered 2019-09-30 – 2019-10-06 (×6): 30 mg via ORAL
  Filled 2019-09-30 (×6): qty 1

## 2019-09-30 MED ORDER — HYDRALAZINE HCL 50 MG PO TABS
100.0000 mg | ORAL_TABLET | Freq: Three times a day (TID) | ORAL | Status: DC
  Administered 2019-09-30 – 2019-10-06 (×16): 100 mg via ORAL
  Filled 2019-09-30 (×16): qty 2

## 2019-09-30 MED ORDER — CARVEDILOL 3.125 MG PO TABS
6.2500 mg | ORAL_TABLET | Freq: Two times a day (BID) | ORAL | Status: DC
  Administered 2019-09-30 – 2019-10-06 (×10): 6.25 mg via ORAL
  Filled 2019-09-30 (×10): qty 2

## 2019-09-30 MED ORDER — POTASSIUM CHLORIDE CRYS ER 20 MEQ PO TBCR
20.0000 meq | EXTENDED_RELEASE_TABLET | Freq: Every day | ORAL | Status: DC
  Administered 2019-10-01 – 2019-10-06 (×5): 20 meq via ORAL
  Filled 2019-09-30 (×5): qty 1

## 2019-09-30 NOTE — Progress Notes (Signed)
OT Cancellation Note  Patient Details Name: Justin Manning MRN: [id number] DOB: 1936-02-13   Cancelled Treatment:    Reason Eval/Treat Not Completed: Medical issues which prohibited therapy  OT consult received and chart reviewed. Upon chart review this AM, pt noted to have critically low K+ at 2.7 which falls outside of guidelines for therapy participation. Will f/u for OT evaluation when pt more appropriate. Thank you.  Gerrianne Scale, [place], OTR/L ascom [phone] 09/30/19, 8:51 AM

## 2019-09-30 NOTE — Progress Notes (Signed)
PROGRESS NOTE    Justin Manning  HDQ:[id number] DOB: 1935/09/30 DOA: 09/29/2019 PCP: Steele Sizer, MD    Brief Narrative:  CAS TRACZ is a 84 y.o. male with medical history significant of chronic kidney disease, dementia, diabetes mellitus, hypertension, atrial fibrillation on Pradaxa had left his house earlier this morning morning around and a neighbor saw him and called the daughter. Apparently patient had fallen. Pt does not remember anything as he is demented. History was given by Er and daughter who I spoke to via phone. Daughter states patient needs skilled nursing.    Consultants:   nephrology, cardiology  Procedures:  Echo 1. Left ventricular ejection fraction, by estimation, is 25 to 30%. The  left ventricle has severely decreased function. The left ventricle  demonstrates global hypokinesis. The left ventricular internal cavity size  was mildly dilated. Left ventricular  diastolic function could not be evaluated.  2. Right ventricular systolic function is normal. The right ventricular  size is normal. There is moderately elevated pulmonary artery systolic  pressure.  3. Left atrial size was mildly dilated.  4. Right atrial size was mildly dilated.  5. The mitral valve is normal in structure. Moderate mitral valve  regurgitation.  6. Tricuspid valve regurgitation is moderate.  7. The aortic valve is normal in structure. Aortic valve regurgitation is  trivial.    Antimicrobials:      Subjective: Daughter at bedside, at baseline confusion but able to identify daughter with much thinking.  Eating breakfast with no trouble  Objective: Vitals:   09/30/19 0430 09/30/19 0753 09/30/19 0755 09/30/19 1100  BP: (!) 163/79 (!) 184/89 (!) 151/91 (!) 161/100  Pulse: (!) 109 (!) 114 81 83  Resp: (!) 26 17  (!) 21  Temp:  98.2 F (36.8 C)  98.1 F (36.7 C)  TempSrc:  Oral  Oral  SpO2:  99%  99%  Weight:      Height:        Intake/Output Summary  (Last 24 hours) at 09/30/2019 1117 Last data filed at 09/30/2019 1010 Gross per 24 hour  Intake 1232.15 ml  Output 400 ml  Net 832.15 ml   Filed Weights   09/29/19 0834  Weight: 65.1 kg    Examination:  General exam: Appears calm and comfortable, confused at baseline Respiratory system: Clear to auscultation. Respiratory effort normal.  No wheezing Cardiovascular system: S1 & S2 heard, RRR. No JVD, murmurs, rubs, gallops or clicks.  Gastrointestinal system: Abdomen is nondistended, soft and nontender.  Normal bowel sounds heard. Central nervous system: Awake, alert, oriented only to person, grossly intact  extremities: No edema Skin: Warm dry Psychiatry: Mood & affect appropriate in current setting.     Data Reviewed: I have personally reviewed following labs and imaging studies  CBC: Recent Labs  Lab 09/29/19 0835 09/30/19 0500  WBC 10.2 8.1  HGB 10.6* 10.6*  HCT 31.7* 31.6*  MCV 91.9 93.2  PLT 207 119   Basic Metabolic Panel: Recent Labs  Lab 09/29/19 0835 09/30/19 0500  NA 144 143  K 2.5* 2.7*  CL 110 109  CO2 26 25  GLUCOSE 132* 136*  BUN 14 14  CREATININE 1.35* 1.46*  CALCIUM 8.7* 9.1   GFR: Estimated Creatinine Clearance: 35.9 mL/min (A) (by C-G formula based on SCr of 1.46 mg/dL (H)). Liver Function Tests: Recent Labs  Lab 09/29/19 0835  AST 26  ALT 17  ALKPHOS 56  BILITOT 0.6  PROT 6.9  ALBUMIN 3.6   No  results for input(s): LIPASE, AMYLASE in the last 168 hours. No results for input(s): AMMONIA in the last 168 hours. Coagulation Profile: No results for input(s): INR, PROTIME in the last 168 hours. Cardiac Enzymes: No results for input(s): CKTOTAL, CKMB, CKMBINDEX, TROPONINI in the last 168 hours. BNP (last 3 results) No results for input(s): PROBNP in the last 8760 hours. HbA1C: No results for input(s): HGBA1C in the last 72 hours. CBG: No results for input(s): GLUCAP in the last 168 hours. Lipid Profile: No results for input(s):  CHOL, HDL, LDLCALC, TRIG, CHOLHDL, LDLDIRECT in the last 72 hours. Thyroid Function Tests: No results for input(s): TSH, T4TOTAL, FREET4, T3FREE, THYROIDAB in the last 72 hours. Anemia Panel: No results for input(s): VITAMINB12, FOLATE, FERRITIN, TIBC, IRON, RETICCTPCT in the last 72 hours. Sepsis Labs: No results for input(s): PROCALCITON, LATICACIDVEN in the last 168 hours.  Recent Results (from the past 240 hour(s))  SARS Coronavirus 2 by RT PCR (hospital order, performed in [facility] hospital lab) Nasopharyngeal Nasopharyngeal Swab     Status: None   Collection Time: 09/29/19  1:10 PM   Specimen: Nasopharyngeal Swab  Result Value Ref Range Status   SARS Coronavirus 2 NEGATIVE NEGATIVE Final    Comment: (NOTE) SARS-CoV-2 target nucleic acids are NOT DETECTED. The SARS-CoV-2 RNA is generally detectable in upper and lower respiratory specimens during the acute phase of infection. The lowest concentration of SARS-CoV-2 viral copies this assay can detect is 250 copies / mL. A negative result does not preclude SARS-CoV-2 infection and should not be used as the sole basis for treatment or other patient management decisions.  A negative result may occur with improper specimen collection / handling, submission of specimen other than nasopharyngeal swab, presence of viral mutation(s) within the areas targeted by this assay, and inadequate number of viral copies (<250 copies / mL). A negative result must be combined with clinical observations, patient history, and epidemiological information. Fact Sheet for Patients:   StrictlyIdeas.no Fact Sheet for Healthcare Providers: BankingDealers.co.za This test is not yet approved or cleared  by the [place] FDA and has been authorized for detection and/or diagnosis of SARS-CoV-2 by FDA under an Emergency Use Authorization (EUA).  This EUA will remain in effect (meaning this test can be used) for  the duration of the COVID-19 declaration under Section 564(b)(1) of the Act, 21 U.S.C. section 360bbb-3(b)(1), unless the authorization is terminated or revoked sooner. Performed at [facility], [address] [place], [place] [address]          Radiology Studies: DG Knee 2 Views Right  Result Date: 09/29/2019 CLINICAL DATA:  Fall this morning. Right knee pain. Initial encounter. EXAM: RIGHT KNEE - 2 VIEW COMPARISON:  None. FINDINGS: No evidence of fracture, dislocation, or joint effusion. Total knee arthroplasty is seen with all 3 components in expected position. Extensive peripheral vascular calcification noted. IMPRESSION: No acute findings. Electronically Signed   By: Marlaine Hind M.D.   On: 09/29/2019 14:26   CT Head Wo Contrast  Result Date: 09/29/2019 CLINICAL DATA:  Altered mental status EXAM: CT HEAD WITHOUT CONTRAST TECHNIQUE: Contiguous axial images were obtained from the base of the skull through the vertex without intravenous contrast. COMPARISON:  01/09/2013 FINDINGS: Brain: No evidence of acute infarction, hemorrhage, hydrocephalus, extra-axial collection or mass lesion/mass effect. Extensive low-density changes within the periventricular and subcortical white matter compatible with chronic microvascular ischemic change. Moderate diffuse cerebral volume loss. Vascular: Atherosclerotic calcifications involving the large vessels of the skull  base. No unexpected hyperdense vessel. Skull: Normal. Negative for fracture or focal lesion. Sinuses/Orbits: Partially opacified bilateral mastoid air cells. Paranasal sinuses are clear. Orbital structures appear unremarkable. Other: None. IMPRESSION: 1. No acute intracranial findings. 2. Chronic microvascular ischemic change and cerebral volume loss. 3. Partially opacified bilateral mastoid air cells. Electronically Signed   By: Davina Poke D.O.   On: 09/29/2019 12:41   ECHOCARDIOGRAM COMPLETE  Result Date: 09/30/2019     ECHOCARDIOGRAM REPORT   Patient Name:   JEKHI BOLIN Date of Exam: 09/29/2019 Medical Rec #:  [id number]        Height:       68.0 in Accession #:    [id number]       Weight:       143.5 lb Date of Birth:  04/27/1936        BSA:          1.775 m Patient Age:    36 years         BP:           142/107 mmHg Patient Gender: M                HR:           86 bpm. Exam Location:  [facility] Procedure: 2D Echo, Cardiac Doppler and Color Doppler Indications:     NSTEMI I21.4  History:         Patient has no prior history of Echocardiogram examinations.                  Signs/Symptoms:Murmur; Risk Factors:Hypertension.  Sonographer:     Alyse Low Roar Referring Phys:  [id number] Nolberto Hanlon Diagnosing Phys: Serafina Royals MD IMPRESSIONS  1. Left ventricular ejection fraction, by estimation, is 25 to 30%. The left ventricle has severely decreased function. The left ventricle demonstrates global hypokinesis. The left ventricular internal cavity size was mildly dilated. Left ventricular diastolic function could not be evaluated.  2. Right ventricular systolic function is normal. The right ventricular size is normal. There is moderately elevated pulmonary artery systolic pressure.  3. Left atrial size was mildly dilated.  4. Right atrial size was mildly dilated.  5. The mitral valve is normal in structure. Moderate mitral valve regurgitation.  6. Tricuspid valve regurgitation is moderate.  7. The aortic valve is normal in structure. Aortic valve regurgitation is trivial. FINDINGS  Left Ventricle: Left ventricular ejection fraction, by estimation, is 25 to 30%. The left ventricle has severely decreased function. The left ventricle demonstrates global hypokinesis. The left ventricular internal cavity size was mildly dilated. There is no left ventricular hypertrophy. Left ventricular diastolic function could not be evaluated. Right Ventricle: The right ventricular size is normal. No increase in right ventricular wall thickness. Right  ventricular systolic function is normal. There is moderately elevated pulmonary artery systolic pressure. The tricuspid regurgitant velocity is 3.37 m/s, and with an assumed right atrial pressure of 10 mmHg, the estimated right ventricular systolic pressure is 09.9 mmHg. Left Atrium: Left atrial size was mildly dilated. Right Atrium: Right atrial size was mildly dilated. Pericardium: There is no evidence of pericardial effusion. Mitral Valve: The mitral valve is normal in structure. Moderate mitral valve regurgitation. Tricuspid Valve: The tricuspid valve is normal in structure. Tricuspid valve regurgitation is moderate. Aortic Valve: The aortic valve is normal in structure. Aortic valve regurgitation is trivial. Aortic valve mean gradient measures 9.0 mmHg. Aortic valve peak gradient measures 15.1 mmHg. Aortic valve area, by VTI measures  1.20 cm. Pulmonic Valve: The pulmonic valve was normal in structure. Pulmonic valve regurgitation is trivial. Aorta: The aortic root and ascending aorta are structurally normal, with no evidence of dilitation. IAS/Shunts: No atrial level shunt detected by color flow Doppler.  LEFT VENTRICLE PLAX 2D LVIDd:         5.08 cm     Diastology LVIDs:         4.31 cm     LV e' lateral:   9.25 cm/s LV PW:         1.04 cm     LV E/e' lateral: 9.9 LV IVS:        1.23 cm     LV e' medial:    7.40 cm/s LVOT diam:     1.80 cm     LV E/e' medial:  12.4 LV SV:         31 LV SV Index:   17 LVOT Area:     2.54 cm  LV Volumes (MOD) LV vol d, MOD A2C: 90.2 ml LV vol d, MOD A4C: 99.0 ml LV vol s, MOD A2C: 60.1 ml LV vol s, MOD A4C: 61.6 ml LV SV MOD A2C:     30.1 ml LV SV MOD A4C:     99.0 ml LV SV MOD BP:      34.1 ml RIGHT VENTRICLE RV Mid diam:    3.29 cm RV S prime:     8.38 cm/s TAPSE (M-mode): 1.5 cm LEFT ATRIUM             Index       RIGHT ATRIUM           Index LA diam:        4.50 cm 2.54 cm/m  RA Area:     19.30 cm LA Vol (A2C):   77.2 ml 43.50 ml/m RA Volume:   58.50 ml  32.96 ml/m LA  Vol (A4C):   77.0 ml 43.38 ml/m LA Biplane Vol: 77.2 ml 43.50 ml/m  AORTIC VALVE                    PULMONIC VALVE AV Area (Vmax):    0.90 cm     PV Vmax:        0.88 m/s AV Area (Vmean):   0.78 cm     PV Peak grad:   3.1 mmHg AV Area (VTI):     1.20 cm     RVOT Peak grad: 1 mmHg AV Vmax:           194.00 cm/s AV Vmean:          139.000 cm/s AV VTI:            0.254 m AV Peak Grad:      15.1 mmHg AV Mean Grad:      9.0 mmHg LVOT Vmax:         68.50 cm/s LVOT Vmean:        42.400 cm/s LVOT VTI:          0.120 m LVOT/AV VTI ratio: 0.47  AORTA Ao Root diam: 2.80 cm MITRAL VALVE               TRICUSPID VALVE MV Area (PHT): 3.89 cm    TR Peak grad:   45.4 mmHg MV Decel Time: 195 msec    TR Vmax:        337.00 cm/s MV E velocity: 91.70 cm/s  SHUNTS                            Systemic VTI:  0.12 m                            Systemic Diam: 1.80 cm Serafina Royals MD Electronically signed by Serafina Royals MD Signature Date/Time: 09/30/2019/8:54:51 AM    Final         Scheduled Meds: . cholecalciferol  2,000 Units Oral BID  . dabigatran  75 mg Oral BID  . diltiazem  240 mg Oral Daily  . hydrALAZINE  50 mg Oral TID  . insulin glargine  6 Units Subcutaneous Q2000  . isosorbide mononitrate  30 mg Oral Daily  . lisinopril  20 mg Oral Daily  . multivitamin with minerals  1 tablet Oral Daily  . rosuvastatin  40 mg Oral QHS  . vitamin B-12  100 mcg Oral Daily   Continuous Infusions: . potassium chloride 10 mEq (09/30/19 1100)    Assessment & Plan:   Active Problems:   Fall   1.s/p fall- CT head neg No fx. PT/OT pending  2. Hypokalemia- likely from HCtz.  It appears clinically has been low We will hold hydrochlorothiazide Aggressively replace potassium Monitor levels  3.afib - with intermittent rvr on tele Continue home Cardizem 240 daily Continue Pradaxa  4. Elevated tp- likely demand ischemia. No cp Echo with EF 5-30 Cards following recommend  Continue  diltiazem 240 mg each day for heart rate control of atrial fibrillation  Lisinopril hydralazine and also use of isosorbide for treatment of congestive heart failure currently stable  High intensity cholesterol therapy without change No further diagnostic necessary okay to discharge from cardiac standpoint with follow-up next week for further medication adjustment   5.CKD IIIa - appears better than previous lab work At baseline Nephrology following  6.Dementia- continue memantine. Daughter wanted [place] to hold Remeron as she thinks it may make him more confused, hold for now.  7. Hypertension-  Elevated currently. Resumed home medications except for hctz.  Increase hydralazine to 100mg  tid  8.DM- on insulin, riss, ck fs   DVT prophylaxis: Pradaxa Code Status: Full Family Communication: Daughter updated at bedside Disposition Plan: SNF versus home Barrier: Found with new cardiomyopathy, needs SNF evaluation, hypokalemia needing IV potassium .medically not ready for discharge possible DC in 1 to 2 days       LOS: 1 day   Time spent: 45 minutes with more than 50% on [place], MD [facility] Pager [phone]  If 7PM-7AM, please contact night-coverage [email or web address] Password [facility] 09/30/2019, 11:17 AM

## 2019-09-30 NOTE — Progress Notes (Signed)
CRITICAL VALUE ALERT  Critical Value:  K+ 2.7  Date & Time Notied:  09/30/2019 0620  Provider Notified: NP, Jonny Ruiz   Orders Received/Actions taken: New orders placed K+ replacement

## 2019-09-30 NOTE — Progress Notes (Signed)
[facility] Encounter Note  Patient: Justin Manning / Admit Date: 09/29/2019 / Date of Encounter: 09/30/2019, 8:56 AM   Subjective: Patient overall feeling better at this time and able to converse although still a bit disoriented.  The patient did have a fall last night after doing things out of his bed which appears to be consistent with disorientation.  There was no injury.  Telemetry showing controlled ventricular rate of atrial fibrillation on current dose of diltiazem. Echocardiogram showing severe LV systolic dysfunction with ejection fraction of 25% with moderate mitral and tricuspid regurgitation No current evidence of congestive heart failure or anginal symptoms  Review of Systems: Positive for: Shortness of breath disorientation Negative for: Vision change, hearing change, syncope, dizziness, nausea, vomiting,diarrhea, bloody stool, stomach pain, cough, congestion, diaphoresis, urinary frequency, urinary pain,skin lesions, skin rashes Others previously listed  Objective: Telemetry: Atrial fibrillation with controlled ventricular rate Physical Exam: Blood pressure (!) 151/91, pulse 81, temperature 98.2 F (36.8 C), temperature source Oral, resp. rate 17, height 5\' 8"  (1.727 m), weight 65.1 kg, SpO2 99 %. Body mass index is 21.82 kg/m. General: Well developed, well nourished, in no acute distress. Head: Normocephalic, atraumatic, sclera non-icteric, no xanthomas, nares are without discharge. Neck: No apparent masses Lungs: Normal respirations with no wheezes, no rhonchi, no rales , no crackles   Heart: Irregular rate and rhythm, normal S1 S2, no murmur, no rub, no gallop, PMI is normal size and placement, carotid upstroke normal without bruit, jugular venous pressure normal Abdomen: Soft, non-tender, non-distended with normoactive bowel sounds. No hepatosplenomegaly. Abdominal aorta is normal size without bruit Extremities: Trace edema, no clubbing, no  cyanosis, no ulcers,  Peripheral: 2+ radial, 2+ femoral, 2+ dorsal pedal pulses Neuro: Alert and oriented. Moves all extremities spontaneously. Psych:  Responds to questions appropriately with a normal affect.   Intake/Output Summary (Last 24 hours) at 09/30/2019 0856 Last data filed at 09/29/2019 1817 Gross per 24 hour  Intake 872.15 ml  Output 400 ml  Net 472.15 ml    Inpatient Medications:  . cholecalciferol  2,000 Units Oral BID  . dabigatran  75 mg Oral BID  . diltiazem  240 mg Oral Daily  . hydrALAZINE  50 mg Oral TID  . insulin glargine  6 Units Subcutaneous Q2000  . lisinopril  20 mg Oral Daily  . multivitamin with minerals  1 tablet Oral Daily  . rosuvastatin  40 mg Oral QHS  . vitamin B-12  100 mcg Oral Daily   Infusions:  . potassium chloride 10 mEq (09/30/19 0832)    Labs: Recent Labs    09/29/19 0835 09/30/19 0500  NA 144 143  K 2.5* 2.7*  CL 110 109  CO2 26 25  GLUCOSE 132* 136*  BUN 14 14  CREATININE 1.35* 1.46*  CALCIUM 8.7* 9.1   Recent Labs    09/29/19 0835  AST 26  ALT 17  ALKPHOS 56  BILITOT 0.6  PROT 6.9  ALBUMIN 3.6   Recent Labs    09/29/19 0835 09/30/19 0500  WBC 10.2 8.1  HGB 10.6* 10.6*  HCT 31.7* 31.6*  MCV 91.9 93.2  PLT 207 209   No results for input(s): CKTOTAL, CKMB, TROPONINI in the last 72 hours. Invalid input(s): POCBNP No results for input(s): HGBA1C in the last 72 hours.   Weights: Filed Weights   09/29/19 0834  Weight: 65.1 kg     Radiology/Studies:  DG Knee 2 Views Right  Result Date: 09/29/2019 CLINICAL DATA:  Fall this morning. Right knee pain. Initial encounter. EXAM: RIGHT KNEE - 2 VIEW COMPARISON:  None. FINDINGS: No evidence of fracture, dislocation, or joint effusion. Total knee arthroplasty is seen with all 3 components in expected position. Extensive peripheral vascular calcification noted. IMPRESSION: No acute findings. Electronically Signed   By: Marlaine Hind M.D.   On: 09/29/2019 14:26   CT  Head Wo Contrast  Result Date: 09/29/2019 CLINICAL DATA:  Altered mental status EXAM: CT HEAD WITHOUT CONTRAST TECHNIQUE: Contiguous axial images were obtained from the base of the skull through the vertex without intravenous contrast. COMPARISON:  01/09/2013 FINDINGS: Brain: No evidence of acute infarction, hemorrhage, hydrocephalus, extra-axial collection or mass lesion/mass effect. Extensive low-density changes within the periventricular and subcortical white matter compatible with chronic microvascular ischemic change. Moderate diffuse cerebral volume loss. Vascular: Atherosclerotic calcifications involving the large vessels of the skull base. No unexpected hyperdense vessel. Skull: Normal. Negative for fracture or focal lesion. Sinuses/Orbits: Partially opacified bilateral mastoid air cells. Paranasal sinuses are clear. Orbital structures appear unremarkable. Other: None. IMPRESSION: 1. No acute intracranial findings. 2. Chronic microvascular ischemic change and cerebral volume loss. 3. Partially opacified bilateral mastoid air cells. Electronically Signed   By: Davina Poke D.O.   On: 09/29/2019 12:41   ECHOCARDIOGRAM COMPLETE  Result Date: 09/30/2019    ECHOCARDIOGRAM REPORT   Patient Name:   Justin Manning Date of Exam: 09/29/2019 Medical Rec #:  [id number]        Height:       68.0 in Accession #:    [id number]       Weight:       143.5 lb Date of Birth:  Apr 21, 1936        BSA:          1.775 m Patient Age:    84 years         BP:           142/107 mmHg Patient Gender: M                HR:           86 bpm. Exam Location:  [facility] Procedure: 2D Echo, Cardiac Doppler and Color Doppler Indications:     NSTEMI I21.4  History:         Patient has no prior history of Echocardiogram examinations.                  Signs/Symptoms:Murmur; Risk Factors:Hypertension.  Sonographer:     Alyse Low Roar Referring Phys:  [id number] Nolberto Hanlon Diagnosing Phys: Serafina Royals MD IMPRESSIONS  1. Left ventricular ejection  fraction, by estimation, is 25 to 30%. The left ventricle has severely decreased function. The left ventricle demonstrates global hypokinesis. The left ventricular internal cavity size was mildly dilated. Left ventricular diastolic function could not be evaluated.  2. Right ventricular systolic function is normal. The right ventricular size is normal. There is moderately elevated pulmonary artery systolic pressure.  3. Left atrial size was mildly dilated.  4. Right atrial size was mildly dilated.  5. The mitral valve is normal in structure. Moderate mitral valve regurgitation.  6. Tricuspid valve regurgitation is moderate.  7. The aortic valve is normal in structure. Aortic valve regurgitation is trivial. FINDINGS  Left Ventricle: Left ventricular ejection fraction, by estimation, is 25 to 30%. The left ventricle has severely decreased function. The left ventricle demonstrates global hypokinesis. The left ventricular internal cavity size was mildly dilated. There is no left ventricular  hypertrophy. Left ventricular diastolic function could not be evaluated. Right Ventricle: The right ventricular size is normal. No increase in right ventricular wall thickness. Right ventricular systolic function is normal. There is moderately elevated pulmonary artery systolic pressure. The tricuspid regurgitant velocity is 3.37 m/s, and with an assumed right atrial pressure of 10 mmHg, the estimated right ventricular systolic pressure is 70.6 mmHg. Left Atrium: Left atrial size was mildly dilated. Right Atrium: Right atrial size was mildly dilated. Pericardium: There is no evidence of pericardial effusion. Mitral Valve: The mitral valve is normal in structure. Moderate mitral valve regurgitation. Tricuspid Valve: The tricuspid valve is normal in structure. Tricuspid valve regurgitation is moderate. Aortic Valve: The aortic valve is normal in structure. Aortic valve regurgitation is trivial. Aortic valve mean gradient measures 9.0  mmHg. Aortic valve peak gradient measures 15.1 mmHg. Aortic valve area, by VTI measures 1.20 cm. Pulmonic Valve: The pulmonic valve was normal in structure. Pulmonic valve regurgitation is trivial. Aorta: The aortic root and ascending aorta are structurally normal, with no evidence of dilitation. IAS/Shunts: No atrial level shunt detected by color flow Doppler.  LEFT VENTRICLE PLAX 2D LVIDd:         5.08 cm     Diastology LVIDs:         4.31 cm     LV e' lateral:   9.25 cm/s LV PW:         1.04 cm     LV E/e' lateral: 9.9 LV IVS:        1.23 cm     LV e' medial:    7.40 cm/s LVOT diam:     1.80 cm     LV E/e' medial:  12.4 LV SV:         31 LV SV Index:   17 LVOT Area:     2.54 cm  LV Volumes (MOD) LV vol d, MOD A2C: 90.2 ml LV vol d, MOD A4C: 99.0 ml LV vol s, MOD A2C: 60.1 ml LV vol s, MOD A4C: 61.6 ml LV SV MOD A2C:     30.1 ml LV SV MOD A4C:     99.0 ml LV SV MOD BP:      34.1 ml RIGHT VENTRICLE RV Mid diam:    3.29 cm RV S prime:     8.38 cm/s TAPSE (M-mode): 1.5 cm LEFT ATRIUM             Index       RIGHT ATRIUM           Index LA diam:        4.50 cm 2.54 cm/m  RA Area:     19.30 cm LA Vol (A2C):   77.2 ml 43.50 ml/m RA Volume:   58.50 ml  32.96 ml/m LA Vol (A4C):   77.0 ml 43.38 ml/m LA Biplane Vol: 77.2 ml 43.50 ml/m  AORTIC VALVE                    PULMONIC VALVE AV Area (Vmax):    0.90 cm     PV Vmax:        0.88 m/s AV Area (Vmean):   0.78 cm     PV Peak grad:   3.1 mmHg AV Area (VTI):     1.20 cm     RVOT Peak grad: 1 mmHg AV Vmax:           194.00 cm/s AV Vmean:  139.000 cm/s AV VTI:            0.254 m AV Peak Grad:      15.1 mmHg AV Mean Grad:      9.0 mmHg LVOT Vmax:         68.50 cm/s LVOT Vmean:        42.400 cm/s LVOT VTI:          0.120 m LVOT/AV VTI ratio: 0.47  AORTA Ao Root diam: 2.80 cm MITRAL VALVE               TRICUSPID VALVE MV Area (PHT): 3.89 cm    TR Peak grad:   45.4 mmHg MV Decel Time: 195 msec    TR Vmax:        337.00 cm/s MV E velocity: 91.70 cm/s                             SHUNTS                            Systemic VTI:  0.12 m                            Systemic Diam: 1.80 cm Serafina Royals MD Electronically signed by Serafina Royals MD Signature Date/Time: 09/30/2019/8:54:51 AM    Final      Assessment and Recommendation  84 y.o. male with known chronic nonvalvular atrial fibrillation hypertension hyperlipidemia diabetes chronic kidney disease with acute disorientation and apparent dizziness still concerning without evidence of myocardial infarction or congestive heart failure but echocardiogram showing severe LV dysfunction 1.  Continue diltiazem 240 mg each day for heart rate control of atrial fibrillation 2.  Lisinopril hydralazine and also use of isosorbide for treatment of congestive heart failure currently stable 3.  High intensity cholesterol therapy without change 4.  Consideration of low-dose of diuretic as an outpatient for congestive heart failure if necessary 5.  Anticoagulation for further risk reduction stroke with atrial fibrillation 6.  No further cardiac diagnostics necessary at this time 7.  Begin ambulation and follow-up for improvements of symptoms and possible discharge home from cardiac standpoint with follow-up next week with further adjustments of medication  Signed, Serafina Royals M.D. FACC

## 2019-09-30 NOTE — Progress Notes (Signed)
PT Cancellation Note  Patient Details Name: Justin Manning MRN: [id number] DOB: January 13, 1936   Cancelled Treatment:    Reason Eval/Treat Not Completed: Medical issues which prohibited therapy(Chart reviewed: K+ corrected from 2.5 -> 2.7, still outside of safe range for PT services. Will defer evaluation until later date/time.)  11:44 AM, 09/30/19 Etta Grandchild, PT, DPT Physical Therapist - [facility]  [phone] ([place])    [place] C 09/30/2019, 11:44 AM

## 2019-09-30 NOTE — Progress Notes (Signed)
[facility]  ROUNDING NOTE   Subjective:   Patient fell this morning Son at bedside  Patient unable to give a history.   Objective:  Vital signs in last 24 hours:  Temp:  [98 F (36.7 C)-98.5 F (36.9 C)] 98.1 F (36.7 C) (05/18 1100) Pulse Rate:  [52-148] 83 (05/18 1100) Resp:  [13-26] 21 (05/18 1100) BP: (91-197)/(66-111) 161/100 (05/18 1100) SpO2:  [90 %-100 %] 99 % (05/18 1100)  Weight change:  Filed Weights   09/29/19 0834  Weight: 65.1 kg    Intake/Output: I/O last 3 completed shifts: In: 872.2 [IV Piggyback:872.2] Out: 400 [Urine:400]   Intake/Output this shift:  Total I/O In: 360 [P.O.:360] Out: -   Physical Exam: General: NAD,   Head: Normocephalic, atraumatic. Moist oral mucosal membranes  Eyes: Anicteric, PERRL  Neck: Supple, trachea midline  Lungs:  Clear to auscultation  Heart: Regular rate and rhythm  Abdomen:  Soft, nontender,   Extremities:  no peripheral edema.  Neurologic: Not alert to situation  Skin: No lesions        Basic Metabolic Panel: Recent Labs  Lab 09/29/19 0835 09/30/19 0500  NA 144 143  K 2.5* 2.7*  CL 110 109  CO2 26 25  GLUCOSE 132* 136*  BUN 14 14  CREATININE 1.35* 1.46*  CALCIUM 8.7* 9.1    Liver Function Tests: Recent Labs  Lab 09/29/19 0835  AST 26  ALT 17  ALKPHOS 56  BILITOT 0.6  PROT 6.9  ALBUMIN 3.6   No results for input(s): LIPASE, AMYLASE in the last 168 hours. No results for input(s): AMMONIA in the last 168 hours.  CBC: Recent Labs  Lab 09/29/19 0835 09/30/19 0500  WBC 10.2 8.1  HGB 10.6* 10.6*  HCT 31.7* 31.6*  MCV 91.9 93.2  PLT 207 209    Cardiac Enzymes: No results for input(s): CKTOTAL, CKMB, CKMBINDEX, TROPONINI in the last 168 hours.  BNP: Invalid input(s): POCBNP  CBG: Recent Labs  Lab 09/30/19 1211  GLUCAP 155*    Microbiology: Results for orders placed or performed during the hospital encounter of 09/29/19  SARS Coronavirus 2 by RT PCR  (hospital order, performed in [facility] hospital lab) Nasopharyngeal Nasopharyngeal Swab     Status: None   Collection Time: 09/29/19  1:10 PM   Specimen: Nasopharyngeal Swab  Result Value Ref Range Status   SARS Coronavirus 2 NEGATIVE NEGATIVE Final    Comment: (NOTE) SARS-CoV-2 target nucleic acids are NOT DETECTED. The SARS-CoV-2 RNA is generally detectable in upper and lower respiratory specimens during the acute phase of infection. The lowest concentration of SARS-CoV-2 viral copies this assay can detect is 250 copies / mL. A negative result does not preclude SARS-CoV-2 infection and should not be used as the sole basis for treatment or other patient management decisions.  A negative result may occur with improper specimen collection / handling, submission of specimen other than nasopharyngeal swab, presence of viral mutation(s) within the areas targeted by this assay, and inadequate number of viral copies (<250 copies / mL). A negative result must be combined with clinical observations, patient history, and epidemiological information. Fact Sheet for Patients:   StrictlyIdeas.no Fact Sheet for Healthcare Providers: BankingDealers.co.za This test is not yet approved or cleared  by the [place] FDA and has been authorized for detection and/or diagnosis of SARS-CoV-2 by FDA under an Emergency Use Authorization (EUA).  This EUA will remain in effect (meaning this test can be used) for the duration of  the COVID-19 declaration under Section 564(b)(1) of the Act, 21 U.S.C. section 360bbb-3(b)(1), unless the authorization is terminated or revoked sooner. Performed at [facility], [address] [place], [place] [address]     Coagulation Studies: No results for input(s): LABPROT, INR in the last 72 hours.  Urinalysis: Recent Labs    09/29/19 0835  COLORURINE STRAW*  LABSPEC 1.008  PHURINE 6.0  GLUCOSEU NEGATIVE   HGBUR SMALL*  BILIRUBINUR NEGATIVE  KETONESUR NEGATIVE  PROTEINUR 100*  NITRITE NEGATIVE  LEUKOCYTESUR NEGATIVE      Imaging: DG Knee 2 Views Right  Result Date: 09/29/2019 CLINICAL DATA:  Fall this morning. Right knee pain. Initial encounter. EXAM: RIGHT KNEE - 2 VIEW COMPARISON:  None. FINDINGS: No evidence of fracture, dislocation, or joint effusion. Total knee arthroplasty is seen with all 3 components in expected position. Extensive peripheral vascular calcification noted. IMPRESSION: No acute findings. Electronically Signed   By: Marlaine Hind M.D.   On: 09/29/2019 14:26   CT Head Wo Contrast  Result Date: 09/29/2019 CLINICAL DATA:  Altered mental status EXAM: CT HEAD WITHOUT CONTRAST TECHNIQUE: Contiguous axial images were obtained from the base of the skull through the vertex without intravenous contrast. COMPARISON:  01/09/2013 FINDINGS: Brain: No evidence of acute infarction, hemorrhage, hydrocephalus, extra-axial collection or mass lesion/mass effect. Extensive low-density changes within the periventricular and subcortical white matter compatible with chronic microvascular ischemic change. Moderate diffuse cerebral volume loss. Vascular: Atherosclerotic calcifications involving the large vessels of the skull base. No unexpected hyperdense vessel. Skull: Normal. Negative for fracture or focal lesion. Sinuses/Orbits: Partially opacified bilateral mastoid air cells. Paranasal sinuses are clear. Orbital structures appear unremarkable. Other: None. IMPRESSION: 1. No acute intracranial findings. 2. Chronic microvascular ischemic change and cerebral volume loss. 3. Partially opacified bilateral mastoid air cells. Electronically Signed   By: Davina Poke D.O.   On: 09/29/2019 12:41   ECHOCARDIOGRAM COMPLETE  Result Date: 09/30/2019    ECHOCARDIOGRAM REPORT   Patient Name:   Justin Manning Date of Exam: 09/29/2019 Medical Rec #:  [id number]        Height:       68.0 in Accession #:     [id number]       Weight:       143.5 lb Date of Birth:  07-Mar-1936        BSA:          1.775 m Patient Age:    84 years         BP:           142/107 mmHg Patient Gender: M                HR:           86 bpm. Exam Location:  [facility] Procedure: 2D Echo, Cardiac Doppler and Color Doppler Indications:     NSTEMI I21.4  History:         Patient has no prior history of Echocardiogram examinations.                  Signs/Symptoms:Murmur; Risk Factors:Hypertension.  Sonographer:     Alyse Low Roar Referring Phys:  [id number] Nolberto Hanlon Diagnosing Phys: Serafina Royals MD IMPRESSIONS  1. Left ventricular ejection fraction, by estimation, is 25 to 30%. The left ventricle has severely decreased function. The left ventricle demonstrates global hypokinesis. The left ventricular internal cavity size was mildly dilated. Left ventricular diastolic function could not be evaluated.  2. Right ventricular systolic function  is normal. The right ventricular size is normal. There is moderately elevated pulmonary artery systolic pressure.  3. Left atrial size was mildly dilated.  4. Right atrial size was mildly dilated.  5. The mitral valve is normal in structure. Moderate mitral valve regurgitation.  6. Tricuspid valve regurgitation is moderate.  7. The aortic valve is normal in structure. Aortic valve regurgitation is trivial. FINDINGS  Left Ventricle: Left ventricular ejection fraction, by estimation, is 25 to 30%. The left ventricle has severely decreased function. The left ventricle demonstrates global hypokinesis. The left ventricular internal cavity size was mildly dilated. There is no left ventricular hypertrophy. Left ventricular diastolic function could not be evaluated. Right Ventricle: The right ventricular size is normal. No increase in right ventricular wall thickness. Right ventricular systolic function is normal. There is moderately elevated pulmonary artery systolic pressure. The tricuspid regurgitant velocity is 3.37 m/s, and  with an assumed right atrial pressure of 10 mmHg, the estimated right ventricular systolic pressure is 32.4 mmHg. Left Atrium: Left atrial size was mildly dilated. Right Atrium: Right atrial size was mildly dilated. Pericardium: There is no evidence of pericardial effusion. Mitral Valve: The mitral valve is normal in structure. Moderate mitral valve regurgitation. Tricuspid Valve: The tricuspid valve is normal in structure. Tricuspid valve regurgitation is moderate. Aortic Valve: The aortic valve is normal in structure. Aortic valve regurgitation is trivial. Aortic valve mean gradient measures 9.0 mmHg. Aortic valve peak gradient measures 15.1 mmHg. Aortic valve area, by VTI measures 1.20 cm. Pulmonic Valve: The pulmonic valve was normal in structure. Pulmonic valve regurgitation is trivial. Aorta: The aortic root and ascending aorta are structurally normal, with no evidence of dilitation. IAS/Shunts: No atrial level shunt detected by color flow Doppler.  LEFT VENTRICLE PLAX 2D LVIDd:         5.08 cm     Diastology LVIDs:         4.31 cm     LV e' lateral:   9.25 cm/s LV PW:         1.04 cm     LV E/e' lateral: 9.9 LV IVS:        1.23 cm     LV e' medial:    7.40 cm/s LVOT diam:     1.80 cm     LV E/e' medial:  12.4 LV SV:         31 LV SV Index:   17 LVOT Area:     2.54 cm  LV Volumes (MOD) LV vol d, MOD A2C: 90.2 ml LV vol d, MOD A4C: 99.0 ml LV vol s, MOD A2C: 60.1 ml LV vol s, MOD A4C: 61.6 ml LV SV MOD A2C:     30.1 ml LV SV MOD A4C:     99.0 ml LV SV MOD BP:      34.1 ml RIGHT VENTRICLE RV Mid diam:    3.29 cm RV S prime:     8.38 cm/s TAPSE (M-mode): 1.5 cm LEFT ATRIUM             Index       RIGHT ATRIUM           Index LA diam:        4.50 cm 2.54 cm/m  RA Area:     19.30 cm LA Vol (A2C):   77.2 ml 43.50 ml/m RA Volume:   58.50 ml  32.96 ml/m LA Vol (A4C):   77.0 ml 43.38 ml/m LA Biplane Vol: 77.2 ml  43.50 ml/m  AORTIC VALVE                    PULMONIC VALVE AV Area (Vmax):    0.90 cm     PV Vmax:         0.88 m/s AV Area (Vmean):   0.78 cm     PV Peak grad:   3.1 mmHg AV Area (VTI):     1.20 cm     RVOT Peak grad: 1 mmHg AV Vmax:           194.00 cm/s AV Vmean:          139.000 cm/s AV VTI:            0.254 m AV Peak Grad:      15.1 mmHg AV Mean Grad:      9.0 mmHg LVOT Vmax:         68.50 cm/s LVOT Vmean:        42.400 cm/s LVOT VTI:          0.120 m LVOT/AV VTI ratio: 0.47  AORTA Ao Root diam: 2.80 cm MITRAL VALVE               TRICUSPID VALVE MV Area (PHT): 3.89 cm    TR Peak grad:   45.4 mmHg MV Decel Time: 195 msec    TR Vmax:        337.00 cm/s MV E velocity: 91.70 cm/s                            SHUNTS                            Systemic VTI:  0.12 m                            Systemic Diam: 1.80 cm Serafina Royals MD Electronically signed by Serafina Royals MD Signature Date/Time: 09/30/2019/8:54:51 AM    Final      Medications:   . potassium chloride 10 mEq (09/30/19 1200)   . cholecalciferol  2,000 Units Oral BID  . dabigatran  75 mg Oral BID  . diltiazem  240 mg Oral Daily  . hydrALAZINE  100 mg Oral TID  . insulin aspart  0-9 Units Subcutaneous Q4H  . insulin glargine  6 Units Subcutaneous Q2000  . isosorbide mononitrate  30 mg Oral Daily  . lisinopril  20 mg Oral Daily  . memantine  5 mg Oral BID  . multivitamin with minerals  1 tablet Oral Daily  . rosuvastatin  40 mg Oral QHS  . vitamin B-12  100 mcg Oral Daily   bisacodyl, ondansetron **OR** ondansetron (ZOFRAN) IV, senna-docusate  Assessment/ Plan:  Mr. GEROLD SAR is a 84 y.o. black male with hypertension, atrial fibrillation, diabetes mellitus type II insulin requiring, BPH who is admitted to [facility] on 09/29/2019 for Hypokalemia [E87.6] Fall [W19.XXXA] Elevated troponin [R77.8]  1. Chronic Kidney Disease stage IIIB:with proteinuria: baseline creatinine of 1.46, GFR of 44 on 09/23/19.  Chronic kidney disease secondary to diabetic nephropathy.   2. Hypertension: urgency on admission. Home regimen of hydralazine,  hydrochlorothiazide, lisinopril, diltiazem. Holding hydrochlorothiazide due to hypokalemia Will increase lisinopril and may add carvedilol if further control is required.   3. Hypokalemia: history of hyponatremia secondary to hydrochlorothiazide.   - Check mag level.  -  lisinopril and potassium replacement.   4. Delirium: could be due to progression of dementia versus acute from new medications: memantine and/or mirtazapine.    LOS: 1 Alexius Hangartner 5/18/202112:57 PM

## 2019-09-30 NOTE — Progress Notes (Signed)
Patient high fall risk and has had an unwitnessed fall. Patient denies hitting head. Patient in low bed with bed alarm was in place at time of fall. Bed alarm didn't sound. Patient pleasantly confused but easily reoriented to hospital setting. Patient was trying to see what the filter fan in the room was all about at time of fall. Patient was moved to room 119 due to disconnection of filter system. Patient with mattress placed at bedside of low bed and bed alarm in place. Patient off telemetry x 2 due to removal by patient since move to room 119 attempting to get out of bed. I would like an order for 1:1 sitter for safety.

## 2019-10-01 DIAGNOSIS — W19XXXD Unspecified fall, subsequent encounter: Secondary | ICD-10-CM

## 2019-10-01 DIAGNOSIS — R778 Other specified abnormalities of plasma proteins: Secondary | ICD-10-CM

## 2019-10-01 DIAGNOSIS — E876 Hypokalemia: Secondary | ICD-10-CM

## 2019-10-01 LAB — BASIC METABOLIC PANEL
Anion gap: 6 (ref 5–15)
BUN: 17 mg/dL (ref 8–23)
CO2: 26 mmol/L (ref 22–32)
Calcium: 8.5 mg/dL — ABNORMAL LOW (ref 8.9–10.3)
Chloride: 113 mmol/L — ABNORMAL HIGH (ref 98–111)
Creatinine, Ser: 1.48 mg/dL — ABNORMAL HIGH (ref 0.61–1.24)
GFR calc Af Amer: 50 mL/min — ABNORMAL LOW (ref >60)
GFR calc non Af Amer: 43 mL/min — ABNORMAL LOW (ref >60)
Glucose, Bld: 86 mg/dL (ref 70–99)
Potassium: 2.9 mmol/L — ABNORMAL LOW (ref 3.5–5.1)
Sodium: 145 mmol/L (ref 135–145)

## 2019-10-01 LAB — GLUCOSE, CAPILLARY
Glucose-Capillary: 107 mg/dL — ABNORMAL HIGH (ref 70–99)
Glucose-Capillary: 126 mg/dL — ABNORMAL HIGH (ref 70–99)
Glucose-Capillary: 142 mg/dL — ABNORMAL HIGH (ref 70–99)
Glucose-Capillary: 198 mg/dL — ABNORMAL HIGH (ref 70–99)
Glucose-Capillary: 84 mg/dL (ref 70–99)
Glucose-Capillary: 85 mg/dL (ref 70–99)
Glucose-Capillary: 96 mg/dL (ref 70–99)

## 2019-10-01 LAB — MAGNESIUM: Magnesium: 1.9 mg/dL (ref 1.7–2.4)

## 2019-10-01 NOTE — Evaluation (Signed)
Occupational Therapy Evaluation Patient Details Name: Justin Manning MRN: [id number] DOB: 12/29/1935 Today's Date: 10/01/2019    History of Present Illness Pt is an 84 yo male who presented at [facility] on 5/17 after a fall. His PMH includes dementia, chronic kidney disease, DM, HTN and AF on Pradaxa   Clinical Impression   Justin Manning presents this morning awake in bed, very engaged and pleasant with sitter at bedside. He was ambulating around the nurses' station using a RW prior to session. Pt was able to state his full name and date of birth, however unable to remember his age, where he is at or the current date/year/time of year. He was able to carry casual conversation, but comprehension and speech expression are limited with more complex questions. Prior to hospitalization, pt lived alone on the first floor of an apt with a tub/shower, regular toilet and SPC he used for mobility. Per phone call with his daughter, pt was independent in ADL/IADL management but family has been increasingly concerned due to pt confusion. She stated, "He falls a lot...at least 2 times in the past 6 months." During session, pt completed bed mobility with supervision and cueing for sequencing. He demonstrated BUE/BLE WFL for tasks assessed and stood from EOB with CGA using a RW due to fair standing balance. He took 3-4 steps forward before sitting in recliner for breakfast. Pt will benefit from skilled acute OT services to address sitting balance, cognitive compensatory strategies and functional mobility to maximize functional participation in ADL/IADL, minimize caregiver burden and decrease falls risk/risk of rehospitalization. Recommend SNF upon discharge.       Follow Up Recommendations  SNF    Equipment Recommendations       Recommendations for Other Services       Precautions / Restrictions Precautions Precautions: Fall      Mobility Bed Mobility Overal bed mobility: Modified Independent              General bed mobility comments: Pt needed increased time and verbal cues for sequencing  Transfers Overall transfer level: Needs assistance Equipment used: Rolling walker (2 wheeled) Transfers: Sit to/from Stand Sit to Stand: Min guard              Balance Overall balance assessment: Needs assistance;History of Falls Sitting-balance support: Feet supported;No upper extremity supported Sitting balance-Leahy Scale: Fair   Postural control: Left lateral lean;Posterior lean(during sitting with feet supported)   Standing balance-Leahy Scale: Fair Standing balance comment: using RW for UE support                           ADL either performed or assessed with clinical judgement   ADL Overall ADL's : Needs assistance/impaired Eating/Feeding: Set up;Sitting   Grooming: Set up;Sitting Grooming Details (indicate cue type and reason): Based on clinical obs, pt needs setup assist when sitting supported. If sitting without support, needs CGA due to F- sitting balance.         Upper Body Dressing : Cueing for sequencing;Sitting;Set up;Supervision/safety Upper Body Dressing Details (indicate cue type and reason): Per clinical obs     Toilet Transfer: Min guard;Cueing for safety;Cueing for sequencing;RW;Ambulation           Functional mobility during ADLs: Min guard;Rolling walker;Cueing for safety;Cueing for sequencing(Pt took 3-4 steps)       Vision Baseline Vision/History: Wears glasses Wears Glasses: At all times Patient Visual Report: No change from baseline Vision Assessment?: No apparent  visual deficits     Perception     Praxis      Pertinent Vitals/Pain Pain Assessment: No/denies pain     Hand Dominance Right   Extremity/Trunk Assessment Upper Extremity Assessment Upper Extremity Assessment: Overall WFL for tasks assessed(grossly 4-/5 for BUE during bed mobility and t/f using RW)   Lower Extremity Assessment Lower Extremity Assessment:  Overall WFL for tasks assessed       Communication Communication Communication: Other (comment)(Pt diagnosed with dementia and exhibits some receptive/expressive/comprehension deficits (see "Cognition"))   Cognition Arousal/Alertness: Awake/alert Behavior During Therapy: WFL for tasks assessed/performed Overall Cognitive Status: History of cognitive impairments - at baseline(H/o dementia)                                 General Comments: Pt has a pleasant and agreeable demeanor throughout session. He is able to state his full name and date of birth, however is unable to remember his age, where he is at or the current year. When asked what season of the year we were entering, he replied, "Winter, because it has been cold outside." Pt is able to carry casual conversation, but comprehension and speech expression are limited with more complex questions.   General Comments  Per dtr report on the phone, pt has had at least 2 falls in the past 6 months    Exercises Other Exercises Other Exercises: Pt introduced to role of OT; minimal reception detected.   Shoulder Instructions      Home Living Family/patient expects to be discharged to:: Private residence Living Arrangements: Alone Available Help at Discharge: Family;Available PRN/intermittently(pt has dtr and 2 sons) Type of Home: Apartment       Home Layout: One level     Bathroom Shower/Tub: Teacher, early years/pre: Standard     Home Equipment: Cane - single point          Prior Functioning/Environment Level of Independence: Independent with assistive device(s)        Comments: Per pt and dtr report, pt used a SPC for household mobility and was independent in ADL/IADL completion. Dtr reports that he did occasionally have difficulties standing up from his toilet.        OT Problem List: Decreased strength;Decreased cognition;Decreased safety awareness;Impaired balance (sitting and/or  standing);Decreased knowledge of use of DME or AE      OT Treatment/Interventions: Self-care/ADL training;Therapeutic exercise;Therapeutic activities;Cognitive remediation/compensation;DME and/or AE instruction;Patient/family education    OT Goals(Current goals can be found in the care plan section) Acute Rehab OT Goals OT Goal Formulation: Patient unable to participate in goal setting(2/2 cognition) ADL Goals Pt Will Perform Grooming: sitting;with supervision(seated for 3-4 min without LOB while completing 2 g/h tasks) Pt Will Transfer to Toilet: with supervision;ambulating;grab bars(with RW) Additional ADL Goal #1: Pt will demonstrate improved safety awareness with supports/visual aid with less than 10% verbal cues  OT Frequency:     Barriers to D/C:            Co-evaluation              AM-PAC OT "6 Clicks" Daily Activity     Outcome Measure Help from another person eating meals?: A Little Help from another person taking care of personal grooming?: A Little Help from another person toileting, which includes using toliet, bedpan, or urinal?: A Little Help from another person bathing (including washing, rinsing, drying)?: A Little Help from another person  to put on and taking off regular upper body clothing?: A Little Help from another person to put on and taking off regular lower body clothing?: A Little 6 Click Score: 18   End of Session Equipment Utilized During Treatment: Gait belt;Rolling walker Nurse Communication: Mobility status;Precautions  Activity Tolerance: Patient tolerated treatment well Patient left: in chair;with call bell/phone within reach;with chair alarm set;with nursing/sitter in room  OT Visit Diagnosis: History of falling (Z91.81);Other symptoms and signs involving cognitive function;Unsteadiness on feet (R26.81)                Time: 9741-6384 OT Time Calculation (min): 38 min Charges:  OT General Charges $OT Visit: 1 Visit OT Evaluation $OT  Eval Moderate Complexity: 1 Mod OT Treatments $Self Care/Home Management : 8-22 mins $Therapeutic Activity: 8-22 mins  Jerilynn Birkenhead, OTS 10/01/19, 10:46 AM

## 2019-10-01 NOTE — NC FL2 (Signed)
[place] LEVEL OF CARE SCREENING TOOL     IDENTIFICATION  Patient Name: Justin Manning Birthdate: 1935-10-12 Sex: male Admission Date (Current Location): 09/29/2019  [place] and [place] Number:  Engineering geologist and Address:  [facility], [address] [place], [place] [address]      Provider Number: [id number]  Attending Physician Name and Address:  Lorella Nimrod, MD  Relative Name and Phone Number:  Lemar Bakos    Current Level of Care: Hospital Recommended Level of Care: [place] Prior Approval Number:    Date Approved/Denied:   PASRR Number:    Discharge Plan: SNF    Current Diagnoses: Patient Active Problem List   Diagnosis Date Noted  . Fall 09/29/2019  . History of alcoholism ([place]) 01/05/2017  . Hiatal hernia 04/11/2015  . Emphysema lung ([place]) 04/11/2015  . Protein malnutrition ([place]) 03/18/2015  . Anemia in chronic illness 11/09/2014  . Abdominal aortic atherosclerosis ([place]) 11/09/2014  . A-fib ([place]) 11/09/2014  . Atrial hypertrophy 11/09/2014  . Carotid arterial disease ([place]) 11/09/2014  . Chronic kidney disease (CKD), stage III (moderate) 11/09/2014  . Diabetes mellitus with renal manifestations, uncontrolled ([place]) 11/09/2014  . Diastolic dysfunction with heart failure ([place]) 11/09/2014  . Dysfunction of eustachian tube 11/09/2014  . Dyslipidemia associated with type 2 diabetes mellitus ([place]) 11/09/2014  . Essential (primary) hypertension 11/09/2014  . Gastro-esophageal reflux disease without esophagitis 11/09/2014  . Hearing loss of left ear 11/09/2014  . H/O carotid endarterectomy 11/09/2014  . H/O malignant neoplasm of colon 11/09/2014  . H/O infectious disease 11/09/2014  . H/O malignant neoplasm of prostate 11/09/2014  . Lung nodule, solitary 11/09/2014  . Mild cognitive disorder 11/09/2014  . TI (tricuspid incompetence) 11/09/2014    Orientation RESPIRATION BLADDER Height & Weight      Self, Situation  Normal Continent Weight: 65.1 kg Height:  5\' 8"  (172.7 cm)  BEHAVIORAL SYMPTOMS/MOOD NEUROLOGICAL BOWEL NUTRITION STATUS      Continent Diet(Carb Modified)  AMBULATORY STATUS COMMUNICATION OF NEEDS Skin   Limited Assist Verbally Normal                       Personal Care Assistance Level of Assistance  Bathing, Feeding, Dressing Bathing Assistance: Limited assistance Feeding assistance: Limited assistance Dressing Assistance: Limited assistance     Functional Limitations Info  Sight, Hearing, Speech Sight Info: Adequate Hearing Info: Adequate Speech Info: Adequate    SPECIAL CARE FACTORS FREQUENCY  PT (By licensed PT), OT (By licensed OT)                    Contractures Contractures Info: Not present    Additional Factors Info  Code Status, Allergies Code Status Info: Full Allergies Info: Aspirin           Current Medications (10/01/2019):  This is the current hospital active medication list Current Facility-Administered Medications  Medication Dose Route Frequency Provider Last Rate Last Admin  . bisacodyl (DULCOLAX) EC tablet 5 mg  5 mg Oral Daily PRN Nolberto Hanlon, MD      . carvedilol (COREG) tablet 6.25 mg  6.25 mg Oral BID WC Nolberto Hanlon, MD   6.25 mg at 10/01/19 0954  . cholecalciferol (VITAMIN D) tablet 2,000 Units  2,000 Units Oral BID Nolberto Hanlon, MD   2,000 Units at 10/01/19 0953  . dabigatran (PRADAXA) capsule 75 mg  75 mg Oral BID Nolberto Hanlon, MD   75 mg at  10/01/19 0953  . diltiazem (CARDIZEM CD) 24 hr capsule 240 mg  240 mg Oral Daily Nolberto Hanlon, MD   240 mg at 10/01/19 0955  . hydrALAZINE (APRESOLINE) tablet 100 mg  100 mg Oral TID Nolberto Hanlon, MD   100 mg at 10/01/19 0952  . insulin aspart (novoLOG) injection 0-9 Units  0-9 Units Subcutaneous Q4H Nolberto Hanlon, MD   1 Units at 10/01/19 1225  . insulin glargine (LANTUS) injection 6 Units  6 Units Subcutaneous Q2000 Nolberto Hanlon, MD   6 Units at 09/30/19 2048  .  isosorbide mononitrate (IMDUR) 24 hr tablet 30 mg  30 mg Oral Daily Corey Skains, MD   30 mg at 10/01/19 0954  . lisinopril (ZESTRIL) tablet 20 mg  20 mg Oral Daily Nolberto Hanlon, MD   20 mg at 10/01/19 0954  . memantine (NAMENDA) tablet 5 mg  5 mg Oral BID Nolberto Hanlon, MD   5 mg at 10/01/19 0955  . multivitamin with minerals tablet 1 tablet  1 tablet Oral Daily Nolberto Hanlon, MD   1 tablet at 10/01/19 0955  . ondansetron (ZOFRAN) tablet 4 mg  4 mg Oral Q6H PRN Nolberto Hanlon, MD       Or  . ondansetron (ZOFRAN) injection 4 mg  4 mg Intravenous Q6H PRN Nolberto Hanlon, MD      . potassium chloride SA (KLOR-CON) CR tablet 20 mEq  20 mEq Oral Daily Kolluru, Sarath, MD   20 mEq at 10/01/19 0952  . rosuvastatin (CRESTOR) tablet 40 mg  40 mg Oral QHS Nolberto Hanlon, MD   40 mg at 09/30/19 2046  . senna-docusate (Senokot-S) tablet 1 tablet  1 tablet Oral QHS PRN Nolberto Hanlon, MD      . vitamin B-12 (CYANOCOBALAMIN) tablet 100 mcg  100 mcg Oral Daily Nolberto Hanlon, MD   100 mcg at 10/01/19 4585     Discharge Medications: Please see discharge summary for a list of discharge medications.  Relevant Imaging Results:  Relevant Lab Results:   Additional Information SS# [id number]  Shelbie Ammons, RN

## 2019-10-01 NOTE — Progress Notes (Signed)
[facility]  ROUNDING NOTE   Subjective:   Son at bedside. Patient is more able to give a history today.  K 2.9   Objective:  Vital signs in last 24 hours:  Temp:  [97.9 F (36.6 C)-98.3 F (36.8 C)] 97.9 F (36.6 C) (05/19 0703) Pulse Rate:  [80-96] 80 (05/19 0703) Resp:  [16-23] 20 (05/19 0703) BP: (122-152)/(43-96) 152/62 (05/19 0703) SpO2:  [93 %-100 %] 93 % (05/19 0703)  Weight change:  Filed Weights   09/29/19 0834  Weight: 65.1 kg    Intake/Output: I/O last 3 completed shifts: In: 480 [P.O.:480] Out: -    Intake/Output this shift:  Total I/O In: 120 [P.O.:120] Out: -   Physical Exam: General: NAD,   Head: Normocephalic, atraumatic. Moist oral mucosal membranes  Eyes: Anicteric, PERRL  Neck: Supple, trachea midline  Lungs:  Clear to auscultation  Heart: Regular rate and rhythm  Abdomen:  Soft, nontender,   Extremities:  no peripheral edema.  Neurologic: Not alert to situation  Skin: No lesions        Basic Metabolic Panel: Recent Labs  Lab 09/29/19 0835 09/30/19 0500 10/01/19 0515  NA 144 143 145  K 2.5* 2.7* 2.9*  CL 110 109 113*  CO2 26 25 26   GLUCOSE 132* 136* 86  BUN 14 14 17   CREATININE 1.35* 1.46* 1.48*  CALCIUM 8.7* 9.1 8.5*  MG  --  1.8 1.9    Liver Function Tests: Recent Labs  Lab 09/29/19 0835  AST 26  ALT 17  ALKPHOS 56  BILITOT 0.6  PROT 6.9  ALBUMIN 3.6   No results for input(s): LIPASE, AMYLASE in the last 168 hours. No results for input(s): AMMONIA in the last 168 hours.  CBC: Recent Labs  Lab 09/29/19 0835 09/30/19 0500  WBC 10.2 8.1  HGB 10.6* 10.6*  HCT 31.7* 31.6*  MCV 91.9 93.2  PLT 207 209    Cardiac Enzymes: No results for input(s): CKTOTAL, CKMB, CKMBINDEX, TROPONINI in the last 168 hours.  BNP: Invalid input(s): POCBNP  CBG: Recent Labs  Lab 09/30/19 1917 10/01/19 0004 10/01/19 0355 10/01/19 0732 10/01/19 1133  GLUCAP 118* 107* 85 84 142*    Microbiology: Results  for orders placed or performed during the hospital encounter of 09/29/19  SARS Coronavirus 2 by RT PCR (hospital order, performed in [facility] hospital lab) Nasopharyngeal Nasopharyngeal Swab     Status: None   Collection Time: 09/29/19  1:10 PM   Specimen: Nasopharyngeal Swab  Result Value Ref Range Status   SARS Coronavirus 2 NEGATIVE NEGATIVE Final    Comment: (NOTE) SARS-CoV-2 target nucleic acids are NOT DETECTED. The SARS-CoV-2 RNA is generally detectable in upper and lower respiratory specimens during the acute phase of infection. The lowest concentration of SARS-CoV-2 viral copies this assay can detect is 250 copies / mL. A negative result does not preclude SARS-CoV-2 infection and should not be used as the sole basis for treatment or other patient management decisions.  A negative result may occur with improper specimen collection / handling, submission of specimen other than nasopharyngeal swab, presence of viral mutation(s) within the areas targeted by this assay, and inadequate number of viral copies (<250 copies / mL). A negative result must be combined with clinical observations, patient history, and epidemiological information. Fact Sheet for Patients:   StrictlyIdeas.no Fact Sheet for Healthcare Providers: BankingDealers.co.za This test is not yet approved or cleared  by the [place] FDA and has been authorized for detection  and/or diagnosis of SARS-CoV-2 by FDA under an Emergency Use Authorization (EUA).  This EUA will remain in effect (meaning this test can be used) for the duration of the COVID-19 declaration under Section 564(b)(1) of the Act, 21 U.S.C. section 360bbb-3(b)(1), unless the authorization is terminated or revoked sooner. Performed at [facility], [address] [place], [place] [address]     Coagulation Studies: No results for input(s): LABPROT, INR in the last 72  hours.  Urinalysis: Recent Labs    09/29/19 0835  COLORURINE STRAW*  LABSPEC 1.008  PHURINE 6.0  GLUCOSEU NEGATIVE  HGBUR SMALL*  BILIRUBINUR NEGATIVE  KETONESUR NEGATIVE  PROTEINUR 100*  NITRITE NEGATIVE  LEUKOCYTESUR NEGATIVE      Imaging: DG Knee 2 Views Right  Result Date: 09/29/2019 CLINICAL DATA:  Fall this morning. Right knee pain. Initial encounter. EXAM: RIGHT KNEE - 2 VIEW COMPARISON:  None. FINDINGS: No evidence of fracture, dislocation, or joint effusion. Total knee arthroplasty is seen with all 3 components in expected position. Extensive peripheral vascular calcification noted. IMPRESSION: No acute findings. Electronically Signed   By: Marlaine Hind M.D.   On: 09/29/2019 14:26   CT Head Wo Contrast  Result Date: 09/29/2019 CLINICAL DATA:  Altered mental status EXAM: CT HEAD WITHOUT CONTRAST TECHNIQUE: Contiguous axial images were obtained from the base of the skull through the vertex without intravenous contrast. COMPARISON:  01/09/2013 FINDINGS: Brain: No evidence of acute infarction, hemorrhage, hydrocephalus, extra-axial collection or mass lesion/mass effect. Extensive low-density changes within the periventricular and subcortical white matter compatible with chronic microvascular ischemic change. Moderate diffuse cerebral volume loss. Vascular: Atherosclerotic calcifications involving the large vessels of the skull base. No unexpected hyperdense vessel. Skull: Normal. Negative for fracture or focal lesion. Sinuses/Orbits: Partially opacified bilateral mastoid air cells. Paranasal sinuses are clear. Orbital structures appear unremarkable. Other: None. IMPRESSION: 1. No acute intracranial findings. 2. Chronic microvascular ischemic change and cerebral volume loss. 3. Partially opacified bilateral mastoid air cells. Electronically Signed   By: Davina Poke D.O.   On: 09/29/2019 12:41   ECHOCARDIOGRAM COMPLETE  Result Date: 09/30/2019    ECHOCARDIOGRAM REPORT   Patient  Name:   Justin Manning Date of Exam: 09/29/2019 Medical Rec #:  [id number]        Height:       68.0 in Accession #:    [id number]       Weight:       143.5 lb Date of Birth:  March 08, 1936        BSA:          1.775 m Patient Age:    70 years         BP:           142/107 mmHg Patient Gender: M                HR:           86 bpm. Exam Location:  [facility] Procedure: 2D Echo, Cardiac Doppler and Color Doppler Indications:     NSTEMI I21.4  History:         Patient has no prior history of Echocardiogram examinations.                  Signs/Symptoms:Murmur; Risk Factors:Hypertension.  Sonographer:     Alyse Low Roar Referring Phys:  [id number] Nolberto Hanlon Diagnosing Phys: Serafina Royals MD IMPRESSIONS  1. Left ventricular ejection fraction, by estimation, is 25 to 30%. The left ventricle has severely decreased function.  The left ventricle demonstrates global hypokinesis. The left ventricular internal cavity size was mildly dilated. Left ventricular diastolic function could not be evaluated.  2. Right ventricular systolic function is normal. The right ventricular size is normal. There is moderately elevated pulmonary artery systolic pressure.  3. Left atrial size was mildly dilated.  4. Right atrial size was mildly dilated.  5. The mitral valve is normal in structure. Moderate mitral valve regurgitation.  6. Tricuspid valve regurgitation is moderate.  7. The aortic valve is normal in structure. Aortic valve regurgitation is trivial. FINDINGS  Left Ventricle: Left ventricular ejection fraction, by estimation, is 25 to 30%. The left ventricle has severely decreased function. The left ventricle demonstrates global hypokinesis. The left ventricular internal cavity size was mildly dilated. There is no left ventricular hypertrophy. Left ventricular diastolic function could not be evaluated. Right Ventricle: The right ventricular size is normal. No increase in right ventricular wall thickness. Right ventricular systolic function is  normal. There is moderately elevated pulmonary artery systolic pressure. The tricuspid regurgitant velocity is 3.37 m/s, and with an assumed right atrial pressure of 10 mmHg, the estimated right ventricular systolic pressure is 63.0 mmHg. Left Atrium: Left atrial size was mildly dilated. Right Atrium: Right atrial size was mildly dilated. Pericardium: There is no evidence of pericardial effusion. Mitral Valve: The mitral valve is normal in structure. Moderate mitral valve regurgitation. Tricuspid Valve: The tricuspid valve is normal in structure. Tricuspid valve regurgitation is moderate. Aortic Valve: The aortic valve is normal in structure. Aortic valve regurgitation is trivial. Aortic valve mean gradient measures 9.0 mmHg. Aortic valve peak gradient measures 15.1 mmHg. Aortic valve area, by VTI measures 1.20 cm. Pulmonic Valve: The pulmonic valve was normal in structure. Pulmonic valve regurgitation is trivial. Aorta: The aortic root and ascending aorta are structurally normal, with no evidence of dilitation. IAS/Shunts: No atrial level shunt detected by color flow Doppler.  LEFT VENTRICLE PLAX 2D LVIDd:         5.08 cm     Diastology LVIDs:         4.31 cm     LV e' lateral:   9.25 cm/s LV PW:         1.04 cm     LV E/e' lateral: 9.9 LV IVS:        1.23 cm     LV e' medial:    7.40 cm/s LVOT diam:     1.80 cm     LV E/e' medial:  12.4 LV SV:         31 LV SV Index:   17 LVOT Area:     2.54 cm  LV Volumes (MOD) LV vol d, MOD A2C: 90.2 ml LV vol d, MOD A4C: 99.0 ml LV vol s, MOD A2C: 60.1 ml LV vol s, MOD A4C: 61.6 ml LV SV MOD A2C:     30.1 ml LV SV MOD A4C:     99.0 ml LV SV MOD BP:      34.1 ml RIGHT VENTRICLE RV Mid diam:    3.29 cm RV S prime:     8.38 cm/s TAPSE (M-mode): 1.5 cm LEFT ATRIUM             Index       RIGHT ATRIUM           Index LA diam:        4.50 cm 2.54 cm/m  RA Area:     19.30 cm LA Vol (A2C):  77.2 ml 43.50 ml/m RA Volume:   58.50 ml  32.96 ml/m LA Vol (A4C):   77.0 ml 43.38 ml/m  LA Biplane Vol: 77.2 ml 43.50 ml/m  AORTIC VALVE                    PULMONIC VALVE AV Area (Vmax):    0.90 cm     PV Vmax:        0.88 m/s AV Area (Vmean):   0.78 cm     PV Peak grad:   3.1 mmHg AV Area (VTI):     1.20 cm     RVOT Peak grad: 1 mmHg AV Vmax:           194.00 cm/s AV Vmean:          139.000 cm/s AV VTI:            0.254 m AV Peak Grad:      15.1 mmHg AV Mean Grad:      9.0 mmHg LVOT Vmax:         68.50 cm/s LVOT Vmean:        42.400 cm/s LVOT VTI:          0.120 m LVOT/AV VTI ratio: 0.47  AORTA Ao Root diam: 2.80 cm MITRAL VALVE               TRICUSPID VALVE MV Area (PHT): 3.89 cm    TR Peak grad:   45.4 mmHg MV Decel Time: 195 msec    TR Vmax:        337.00 cm/s MV E velocity: 91.70 cm/s                            SHUNTS                            Systemic VTI:  0.12 m                            Systemic Diam: 1.80 cm Serafina Royals MD Electronically signed by Serafina Royals MD Signature Date/Time: 09/30/2019/8:54:51 AM    Final      Medications:    . carvedilol  6.25 mg Oral BID WC  . cholecalciferol  2,000 Units Oral BID  . dabigatran  75 mg Oral BID  . diltiazem  240 mg Oral Daily  . hydrALAZINE  100 mg Oral TID  . insulin aspart  0-9 Units Subcutaneous Q4H  . insulin glargine  6 Units Subcutaneous Q2000  . isosorbide mononitrate  30 mg Oral Daily  . lisinopril  20 mg Oral Daily  . memantine  5 mg Oral BID  . multivitamin with minerals  1 tablet Oral Daily  . potassium chloride  20 mEq Oral Daily  . rosuvastatin  40 mg Oral QHS  . vitamin B-12  100 mcg Oral Daily   bisacodyl, ondansetron **OR** ondansetron (ZOFRAN) IV, senna-docusate  Assessment/ Plan:  Mr. KEEDAN SAMPLE is a 84 y.o. black male with hypertension, atrial fibrillation, diabetes mellitus type II insulin requiring, BPH who is admitted to [facility] on 09/29/2019 for Hypokalemia [E87.6] Fall [W19.XXXA] Elevated troponin [R77.8]  1. Chronic Kidney Disease stage IIIB:with proteinuria: baseline creatinine of  1.46, GFR of 44 on 09/23/19.  Chronic kidney disease secondary to diabetic nephropathy.   2. Hypertension: urgency on admission. Currently 152/62. Home  regimen of hydralazine, hydrochlorothiazide, lisinopril, diltiazem.  - Holding hydrochlorothiazide due to hypokalemia  3. Hypokalemia: history of hyponatremia secondary to hydrochlorothiazide.   Magnesium level at goal.  - lisinopril and PO potassium replacement.   4. Delirium: could be due to progression of dementia versus acute from new medications: memantine and/or mirtazapine. Holding both agents.    LOS: 2 Haiden Rawlinson 5/19/202112:07 PM

## 2019-10-01 NOTE — Progress Notes (Signed)
PROGRESS NOTE    Justin Manning  PNT:[id number] DOB: [birth year] DOA: 09/29/2019 PCP: Steele Sizer, MD   Brief Narrative:  Justin Manning a 84 y.o.malewith medical history significant ofchronic kidney disease, dementia, diabetes mellitus, hypertension, atrial fibrillation on Pradaxa had left his house earlier this morning morning around and a neighbor sawhim and called the daughter. Apparently patient had fallen. Pt does not remember anything secondary to advanced dementia.  Subjective: Patient had no new complaints today.  Denies any chest pain or shortness of breath.  He was sitting in the chair comfortably and accompanied by his son in the room.  Assessment & Plan:   Active Problems:   Fall  Recurrent falls.  Patient had a fall in hospital a day ago, CT head was negative. PT/OT recommending SNF placement. -We will see is working on bed search and insurance authorization.  Atrial fibrillation.  Currently rate controlled with Cardizem. -Cardizem and Pradaxa.  HFrEF.  New diagnosis, EF of 25 to 30%. Cardiology was consulted-they do not recommend further evaluation and will continue with current management.  Presently appears euvolemic. Might need low-dose antidiuretics as an outpatient. -Continue with lisinopril, statin and Imdur along with diltiazem.  CKD stage IIIa.  Creatinine seems stable. -Nephrology is following-appreciate their recommendations.  Hypokalemia.  Magnesium within normal limit. -Continue to replete as needed. -HCTZ was discontinued by nephrology due to concern of persistent hypokalemia.  Hypertension.  Her within goal. -Continue hydralazine and lisinopril. -Hydralazine dose was increased 200 mg 3 times daily during current hospitalization.  Dementia.  No acute concern. -Continue memantine. -Remeron was held due to concern of worsening confusion per daughter.  Type 2 diabetes. -Continue SSI  Objective: Vitals:   09/30/19 2053 09/30/19  2300 10/01/19 0007 10/01/19 0703  BP: (!) 122/96  (!) 134/43 (!) 152/62  Pulse: 89  85 80  Resp:  (!) 22 (!) 23 20  Temp:   98.1 F (36.7 C) 97.9 F (36.6 C)  TempSrc:   Oral Oral  SpO2:   100% 93%  Weight:      Height:        Intake/Output Summary (Last 24 hours) at 10/01/2019 1623 Last data filed at 10/01/2019 1037 Gross per 24 hour  Intake 120 ml  Output --  Net 120 ml   Filed Weights   09/29/19 0834  Weight: 65.1 kg    Examination:  General exam: Appears calm and comfortable  Respiratory system: Clear to auscultation. Respiratory effort normal. Cardiovascular system: S1 & S2 heard, RRR. No JVD, murmurs, rubs, gallops or clicks. Gastrointestinal system: Soft, nontender, nondistended, bowel sounds positive. Central nervous system: Alert and oriented. No focal neurological deficits. Extremities: No edema, no cyanosis, pulses intact and symmetrical. Psychiatry: Judgement and insight appear normal. Mood & affect appropriate.    DVT prophylaxis: Pradaxa Code Status: Full Family Communication: Son was updated at bedside. Disposition Plan:  Status is: Inpatient  Remains inpatient appropriate because:Inpatient level of care appropriate due to severity of illness   Dispo: The patient is from: Home              Anticipated d/c is to: SNF              Anticipated d/c date is: 1 day              Patient currently is medically stable to d/c.  DT is recommending SNF placement.  TOC to find a bed and insurance authorization.  Consultants:   Cardiology  Nephrology  Procedures:  Antimicrobials:   Data Reviewed: I have personally reviewed following labs and imaging studies  CBC: Recent Labs  Lab 09/29/19 0835 09/30/19 0500  WBC 10.2 8.1  HGB 10.6* 10.6*  HCT 31.7* 31.6*  MCV 91.9 93.2  PLT 207 299   Basic Metabolic Panel: Recent Labs  Lab 09/29/19 0835 09/30/19 0500 10/01/19 0515  NA 144 143 145  K 2.5* 2.7* 2.9*  CL 110 109 113*  CO2 26 25 26   GLUCOSE  132* 136* 86  BUN 14 14 17   CREATININE 1.35* 1.46* 1.48*  CALCIUM 8.7* 9.1 8.5*  MG  --  1.8 1.9   GFR: Estimated Creatinine Clearance: 35.4 mL/min (A) (by C-G formula based on SCr of 1.48 mg/dL (H)). Liver Function Tests: Recent Labs  Lab 09/29/19 0835  AST 26  ALT 17  ALKPHOS 56  BILITOT 0.6  PROT 6.9  ALBUMIN 3.6   No results for input(s): LIPASE, AMYLASE in the last 168 hours. No results for input(s): AMMONIA in the last 168 hours. Coagulation Profile: No results for input(s): INR, PROTIME in the last 168 hours. Cardiac Enzymes: No results for input(s): CKTOTAL, CKMB, CKMBINDEX, TROPONINI in the last 168 hours. BNP (last 3 results) No results for input(s): PROBNP in the last 8760 hours. HbA1C: No results for input(s): HGBA1C in the last 72 hours. CBG: Recent Labs  Lab 09/30/19 1917 10/01/19 0004 10/01/19 0355 10/01/19 0732 10/01/19 1133  GLUCAP 118* 107* 85 84 142*   Lipid Profile: No results for input(s): CHOL, HDL, LDLCALC, TRIG, CHOLHDL, LDLDIRECT in the last 72 hours. Thyroid Function Tests: No results for input(s): TSH, T4TOTAL, FREET4, T3FREE, THYROIDAB in the last 72 hours. Anemia Panel: No results for input(s): VITAMINB12, FOLATE, FERRITIN, TIBC, IRON, RETICCTPCT in the last 72 hours. Sepsis Labs: No results for input(s): PROCALCITON, LATICACIDVEN in the last 168 hours.  Recent Results (from the past 240 hour(s))  SARS Coronavirus 2 by RT PCR (hospital order, performed in [facility] hospital lab) Nasopharyngeal Nasopharyngeal Swab     Status: None   Collection Time: 09/29/19  1:10 PM   Specimen: Nasopharyngeal Swab  Result Value Ref Range Status   SARS Coronavirus 2 NEGATIVE NEGATIVE Final    Comment: (NOTE) SARS-CoV-2 target nucleic acids are NOT DETECTED. The SARS-CoV-2 RNA is generally detectable in upper and lower respiratory specimens during the acute phase of infection. The lowest concentration of SARS-CoV-2 viral copies this assay can  detect is 250 copies / mL. A negative result does not preclude SARS-CoV-2 infection and should not be used as the sole basis for treatment or other patient management decisions.  A negative result may occur with improper specimen collection / handling, submission of specimen other than nasopharyngeal swab, presence of viral mutation(s) within the areas targeted by this assay, and inadequate number of viral copies (<250 copies / mL). A negative result must be combined with clinical observations, patient history, and epidemiological information. Fact Sheet for Patients:   StrictlyIdeas.no Fact Sheet for Healthcare Providers: BankingDealers.co.za This test is not yet approved or cleared  by the [place] FDA and has been authorized for detection and/or diagnosis of SARS-CoV-2 by FDA under an Emergency Use Authorization (EUA).  This EUA will remain in effect (meaning this test can be used) for the duration of the COVID-19 declaration under Section 564(b)(1) of the Act, 21 U.S.C. section 360bbb-3(b)(1), unless the authorization is terminated or revoked sooner. Performed at [facility], [address], [place], [place] [address]  Radiology Studies: ECHOCARDIOGRAM COMPLETE  Result Date: 09/30/2019    ECHOCARDIOGRAM REPORT   Patient Name:   AMADOU KATZENSTEIN Date of Exam: 09/29/2019 Medical Rec #:  [id number]        Height:       68.0 in Accession #:    [id number]       Weight:       143.5 lb Date of Birth:  [birth year]        BSA:          1.775 m Patient Age:    34 years         BP:           142/107 mmHg Patient Gender: M                HR:           86 bpm. Exam Location:  [facility] Procedure: 2D Echo, Cardiac Doppler and Color Doppler Indications:     NSTEMI I21.4  History:         Patient has no prior history of Echocardiogram examinations.                  Signs/Symptoms:Murmur; Risk Factors:Hypertension.  Sonographer:     Alyse Low  Roar Referring Phys:  [id number] Nolberto Hanlon Diagnosing Phys: Serafina Royals MD IMPRESSIONS  1. Left ventricular ejection fraction, by estimation, is 25 to 30%. The left ventricle has severely decreased function. The left ventricle demonstrates global hypokinesis. The left ventricular internal cavity size was mildly dilated. Left ventricular diastolic function could not be evaluated.  2. Right ventricular systolic function is normal. The right ventricular size is normal. There is moderately elevated pulmonary artery systolic pressure.  3. Left atrial size was mildly dilated.  4. Right atrial size was mildly dilated.  5. The mitral valve is normal in structure. Moderate mitral valve regurgitation.  6. Tricuspid valve regurgitation is moderate.  7. The aortic valve is normal in structure. Aortic valve regurgitation is trivial. FINDINGS  Left Ventricle: Left ventricular ejection fraction, by estimation, is 25 to 30%. The left ventricle has severely decreased function. The left ventricle demonstrates global hypokinesis. The left ventricular internal cavity size was mildly dilated. There is no left ventricular hypertrophy. Left ventricular diastolic function could not be evaluated. Right Ventricle: The right ventricular size is normal. No increase in right ventricular wall thickness. Right ventricular systolic function is normal. There is moderately elevated pulmonary artery systolic pressure. The tricuspid regurgitant velocity is 3.37 m/s, and with an assumed right atrial pressure of 10 mmHg, the estimated right ventricular systolic pressure is 84.5 mmHg. Left Atrium: Left atrial size was mildly dilated. Right Atrium: Right atrial size was mildly dilated. Pericardium: There is no evidence of pericardial effusion. Mitral Valve: The mitral valve is normal in structure. Moderate mitral valve regurgitation. Tricuspid Valve: The tricuspid valve is normal in structure. Tricuspid valve regurgitation is moderate. Aortic Valve: The  aortic valve is normal in structure. Aortic valve regurgitation is trivial. Aortic valve mean gradient measures 9.0 mmHg. Aortic valve peak gradient measures 15.1 mmHg. Aortic valve area, by VTI measures 1.20 cm. Pulmonic Valve: The pulmonic valve was normal in structure. Pulmonic valve regurgitation is trivial. Aorta: The aortic root and ascending aorta are structurally normal, with no evidence of dilitation. IAS/Shunts: No atrial level shunt detected by color flow Doppler.  LEFT VENTRICLE PLAX 2D LVIDd:         5.08 cm     Diastology LVIDs:  4.31 cm     LV e' lateral:   9.25 cm/s LV PW:         1.04 cm     LV E/e' lateral: 9.9 LV IVS:        1.23 cm     LV e' medial:    7.40 cm/s LVOT diam:     1.80 cm     LV E/e' medial:  12.4 LV SV:         31 LV SV Index:   17 LVOT Area:     2.54 cm  LV Volumes (MOD) LV vol d, MOD A2C: 90.2 ml LV vol d, MOD A4C: 99.0 ml LV vol s, MOD A2C: 60.1 ml LV vol s, MOD A4C: 61.6 ml LV SV MOD A2C:     30.1 ml LV SV MOD A4C:     99.0 ml LV SV MOD BP:      34.1 ml RIGHT VENTRICLE RV Mid diam:    3.29 cm RV S prime:     8.38 cm/s TAPSE (M-mode): 1.5 cm LEFT ATRIUM             Index       RIGHT ATRIUM           Index LA diam:        4.50 cm 2.54 cm/m  RA Area:     19.30 cm LA Vol (A2C):   77.2 ml 43.50 ml/m RA Volume:   58.50 ml  32.96 ml/m LA Vol (A4C):   77.0 ml 43.38 ml/m LA Biplane Vol: 77.2 ml 43.50 ml/m  AORTIC VALVE                    PULMONIC VALVE AV Area (Vmax):    0.90 cm     PV Vmax:        0.88 m/s AV Area (Vmean):   0.78 cm     PV Peak grad:   3.1 mmHg AV Area (VTI):     1.20 cm     RVOT Peak grad: 1 mmHg AV Vmax:           194.00 cm/s AV Vmean:          139.000 cm/s AV VTI:            0.254 m AV Peak Grad:      15.1 mmHg AV Mean Grad:      9.0 mmHg LVOT Vmax:         68.50 cm/s LVOT Vmean:        42.400 cm/s LVOT VTI:          0.120 m LVOT/AV VTI ratio: 0.47  AORTA Ao Root diam: 2.80 cm MITRAL VALVE               TRICUSPID VALVE MV Area (PHT): 3.89 cm    TR  Peak grad:   45.4 mmHg MV Decel Time: 195 msec    TR Vmax:        337.00 cm/s MV E velocity: 91.70 cm/s                            SHUNTS                            Systemic VTI:  0.12 m  Systemic Diam: 1.80 cm Serafina Royals MD Electronically signed by Serafina Royals MD Signature Date/Time: 09/30/2019/8:54:51 AM    Final     Scheduled Meds: . carvedilol  6.25 mg Oral BID WC  . cholecalciferol  2,000 Units Oral BID  . dabigatran  75 mg Oral BID  . diltiazem  240 mg Oral Daily  . hydrALAZINE  100 mg Oral TID  . insulin aspart  0-9 Units Subcutaneous Q4H  . insulin glargine  6 Units Subcutaneous Q2000  . isosorbide mononitrate  30 mg Oral Daily  . lisinopril  20 mg Oral Daily  . memantine  5 mg Oral BID  . multivitamin with minerals  1 tablet Oral Daily  . potassium chloride  20 mEq Oral Daily  . rosuvastatin  40 mg Oral QHS  . vitamin B-12  100 mcg Oral Daily   Continuous Infusions:   LOS: 2 days   Time spent: 40 minutes.  Lorella Nimrod, MD [facility]  If 7PM-7AM, please contact night-coverage [email or web address]  10/01/2019, 4:23 PM   This record has been created using Systems analyst. Errors have been sought and corrected,but may not always be located. Such creation errors do not reflect on the standard of care.

## 2019-10-01 NOTE — Evaluation (Signed)
Physical Therapy Evaluation Patient Details Name: Justin Manning MRN: [id number] DOB: 1935-10-02 Today's Date: 10/01/2019   History of Present Illness  Pt is an 84 yo male who presented at [facility] on 5/17 after a fall. His PMH includes dementia, chronic kidney disease, DM, HTN and AF on Pradaxa    Clinical Impression  Pt alert, in recliner at start of session, denied pain, pt's son also at bedside. Pt oriented to self and place, disoriented to time/situation. Able to provide some PLOF information, son corrected/answered as needed. Pt lives alone, but son stated that he and his sister rotate, and someone is with him most of the time. Alone while sleeping and for about 1.5 hrs between siblings switching. Utilized SPC at baseline for ambulation.  The patient demonstrated difficulty with sit <> Stand transfer from standard recliner. CGA to complete with extended time but minA for initial standing balance deficits. Attempted ambulation with handheld assist but pt unsteady, and reached for additional external support, provided with RW. Improved safety noted but pt needed minA-CGA, and had two LOB with minA from PT to correct. Cued for RW use.  Overall the patient demonstrated deficits (see "PT Problem List") that impede the patient's functional abilities, safety, and mobility and would benefit from skilled PT intervention. Recommendation is SNF due to decline in functional status and current level of assistance needed.     Follow Up Recommendations SNF    Equipment Recommendations  Rolling walker with 5" wheels;3in1 (PT)    Recommendations for Other Services       Precautions / Restrictions Precautions Precautions: Fall Restrictions Weight Bearing Restrictions: No      Mobility  Bed Mobility Overal bed mobility: Modified Independent             General bed mobility comments: pt up in recliner at start of session  Transfers Overall transfer level: Needs assistance Equipment used:  Rolling walker (2 wheeled);1 person hand held assist Transfers: Sit to/from Stand Sit to Stand: Min guard;Min assist         General transfer comment: pt with significant difficulty transferring from standard recliner but able to complete with extra time and minA for initial standing balance  Ambulation/Gait Ambulation/Gait assistance: Min assist Gait Distance (Feet): 70 Feet Assistive device: Rolling walker (2 wheeled);1 person hand held assist   Gait velocity: decreased   General Gait Details: Attempted with handheld assist (pt utilizes SPC at home), pt unsteady, reaches for additional external support, provided with RW. Improved safety noted but pt needed minA-CGA, and had two LOB with minA from PT to correct. Cued for RW use.  Stairs            Wheelchair Mobility    Modified Rankin (Stroke Patients Only)       Balance Overall balance assessment: Needs assistance;History of Falls Sitting-balance support: Feet supported;No upper extremity supported Sitting balance-Leahy Scale: Fair   Postural control: Left lateral lean;Posterior lean(during sitting with feet supported)   Standing balance-Leahy Scale: Poor Standing balance comment: using RW for UE support                             Pertinent Vitals/Pain Pain Assessment: No/denies pain    Home Living Family/patient expects to be discharged to:: Private residence Living Arrangements: Alone Available Help at Discharge: Family;Available PRN/intermittently;Other (Comment)(son reported that he or his sister is there during the day, short periords of when he will be home alone) Type of  Home: Apartment       Home Layout: One level Home Equipment: Cane - single point;Grab bars - tub/shower;Walker - 2 wheels      Prior Function Level of Independence: Independent with assistive device(s)         Comments: Per pt and son report, pt used a SPC for household mobility and was independent in ADL/IADL  completion     Hand Dominance   Dominant Hand: Right    Extremity/Trunk Assessment   Upper Extremity Assessment Upper Extremity Assessment: Overall WFL for tasks assessed    Lower Extremity Assessment Lower Extremity Assessment: Generalized weakness    Cervical / Trunk Assessment Cervical / Trunk Assessment: Normal  Communication   Communication: No difficulties(Pt diagnosed with dementia and exhibits some receptive/expressive/comprehension deficits (see "Cognition"))  Cognition Arousal/Alertness: Awake/alert Behavior During Therapy: WFL for tasks assessed/performed Overall Cognitive Status: History of cognitive impairments - at baseline                                 General Comments: oriented to self, knew he was in the hospital. disoriented to situation, time.      General Comments General comments (skin integrity, edema, etc.): Per dtr report on the phone, pt has had at least 2 falls in the past 6 months    Exercises Other Exercises Other Exercises: Pt introduced to role of OT; minimal reception detected.   Assessment/Plan    PT Assessment Patient needs continued PT services  PT Problem List Decreased strength;Decreased mobility;Decreased safety awareness;Decreased activity tolerance;Decreased balance;Decreased knowledge of use of DME       PT Treatment Interventions DME instruction;Therapeutic activities;Gait training;Therapeutic exercise;Patient/family education;Stair training;Balance training;Functional mobility training;Neuromuscular re-education    PT Goals (Current goals can be found in the Care Plan section)  Acute Rehab PT Goals Patient Stated Goal: to get stronger PT Goal Formulation: With patient Time For Goal Achievement: 10/15/19    Frequency Min 2X/week   Barriers to discharge        Co-evaluation               AM-PAC PT "6 Clicks" Mobility  Outcome Measure Help needed turning from your back to your side while in a  flat bed without using bedrails?: A Little Help needed moving from lying on your back to sitting on the side of a flat bed without using bedrails?: A Little Help needed moving to and from a bed to a chair (including a wheelchair)?: A Little Help needed standing up from a chair using your arms (e.g., wheelchair or bedside chair)?: A Little Help needed to walk in hospital room?: A Little Help needed climbing 3-5 steps with a railing? : A Lot 6 Click Score: 17    End of Session Equipment Utilized During Treatment: Gait belt Activity Tolerance: Patient tolerated treatment well Patient left: in chair;with call bell/phone within reach;with nursing/sitter in room Nurse Communication: Mobility status PT Visit Diagnosis: Other abnormalities of gait and mobility (R26.89);History of falling (Z91.81);Difficulty in walking, not elsewhere classified (R26.2)    Time: 1497-0263 PT Time Calculation (min) (ACUTE ONLY): 28 min   Charges:   PT Evaluation $PT Eval Moderate Complexity: 1 Mod PT Treatments $Therapeutic Exercise: 8-22 mins      Lieutenant Diego PT, DPT 1:18 PM,10/01/19

## 2019-10-01 NOTE — TOC Initial Note (Signed)
Transition of Care [facility]) - Initial/Assessment Note    Patient Details  Name: Justin Manning MRN: [id number] Date of Birth: 1936/02/10  Transition of Care [facility]) CM/SW Contact:    Shelbie Ammons, RN Phone Number: 10/01/2019, 3:47 PM  Clinical Narrative:    RNCM contacted patient's daughter due to cognitive status. Patient currently lives at home alone and was found outside, down on ground and confused. Patient being treated for metabolic imbalance. Daughter reports that she is in agreement with patient going to facility for short term rehab and will eventually need long term care. Daughter requests that beds be sought and then she will look at them and make a decision. RNCM requested PASSR, completed FL-2 and initiated bed search.                Expected Discharge Plan: Skilled Nursing Facility Barriers to Discharge: Continued Medical Work up   Patient Goals and CMS Choice        Expected Discharge Plan and Services Expected Discharge Plan: [place]   Discharge Planning Services: CM Consult Post Acute Care Choice: [place] Living arrangements for the past 2 months: Apartment                                      Prior Living Arrangements/Services Living arrangements for the past 2 months: Apartment Lives with:: Self Patient language and need for interpreter reviewed:: Yes Do you feel safe going back to the place where you live?: Yes      Need for Family Participation in Patient Care: Yes (Comment) Care giver support system in place?: Yes (comment)   Criminal Activity/Legal Involvement Pertinent to Current Situation/Hospitalization: No - Comment as needed  Activities of Daily Living      Permission Sought/Granted                  Emotional Assessment              Admission diagnosis:  Hypokalemia [E87.6] Fall [W19.XXXA] Elevated troponin [R77.8] Patient Active Problem List   Diagnosis Date Noted  . Fall 09/29/2019   . History of alcoholism ([place]) 01/05/2017  . Hiatal hernia 04/11/2015  . Emphysema lung ([place]) 04/11/2015  . Protein malnutrition ([place]) 03/18/2015  . Anemia in chronic illness 11/09/2014  . Abdominal aortic atherosclerosis ([place]) 11/09/2014  . A-fib ([place]) 11/09/2014  . Atrial hypertrophy 11/09/2014  . Carotid arterial disease ([place]) 11/09/2014  . Chronic kidney disease (CKD), stage III (moderate) 11/09/2014  . Diabetes mellitus with renal manifestations, uncontrolled ([place]) 11/09/2014  . Diastolic dysfunction with heart failure ([place]) 11/09/2014  . Dysfunction of eustachian tube 11/09/2014  . Dyslipidemia associated with type 2 diabetes mellitus ([place]) 11/09/2014  . Essential (primary) hypertension 11/09/2014  . Gastro-esophageal reflux disease without esophagitis 11/09/2014  . Hearing loss of left ear 11/09/2014  . H/O carotid endarterectomy 11/09/2014  . H/O malignant neoplasm of colon 11/09/2014  . H/O infectious disease 11/09/2014  . H/O malignant neoplasm of prostate 11/09/2014  . Lung nodule, solitary 11/09/2014  . Mild cognitive disorder 11/09/2014  . TI (tricuspid incompetence) 11/09/2014   PCP:  Steele Sizer, MD Pharmacy:   [id number] [address], [place] AT [address] [place] [address] Phone: [phone] Fax: [place], [place] [address]  AT [place] [place] [facility] [place] [address] Phone: [phone] Fax: [phone]     Social Determinants of Health (SDOH) Interventions    Readmission Risk Interventions No flowsheet data found.

## 2019-10-02 LAB — BASIC METABOLIC PANEL
Anion gap: 11 (ref 5–15)
BUN: 23 mg/dL (ref 8–23)
CO2: 23 mmol/L (ref 22–32)
Calcium: 8.9 mg/dL (ref 8.9–10.3)
Chloride: 110 mmol/L (ref 98–111)
Creatinine, Ser: 1.66 mg/dL — ABNORMAL HIGH (ref 0.61–1.24)
GFR calc Af Amer: 44 mL/min — ABNORMAL LOW (ref >60)
GFR calc non Af Amer: 38 mL/min — ABNORMAL LOW (ref >60)
Glucose, Bld: 108 mg/dL — ABNORMAL HIGH (ref 70–99)
Potassium: 3.1 mmol/L — ABNORMAL LOW (ref 3.5–5.1)
Sodium: 144 mmol/L (ref 135–145)

## 2019-10-02 LAB — GLUCOSE, CAPILLARY
Glucose-Capillary: 101 mg/dL — ABNORMAL HIGH (ref 70–99)
Glucose-Capillary: 116 mg/dL — ABNORMAL HIGH (ref 70–99)
Glucose-Capillary: 121 mg/dL — ABNORMAL HIGH (ref 70–99)
Glucose-Capillary: 196 mg/dL — ABNORMAL HIGH (ref 70–99)
Glucose-Capillary: 214 mg/dL — ABNORMAL HIGH (ref 70–99)
Glucose-Capillary: 87 mg/dL (ref 70–99)

## 2019-10-02 MED ORDER — LORAZEPAM 0.5 MG PO TABS
0.5000 mg | ORAL_TABLET | Freq: Four times a day (QID) | ORAL | Status: DC | PRN
  Administered 2019-10-02 – 2019-10-03 (×4): 0.5 mg via ORAL
  Filled 2019-10-02 (×4): qty 1

## 2019-10-02 MED ORDER — QUETIAPINE FUMARATE 25 MG PO TABS
25.0000 mg | ORAL_TABLET | Freq: Every day | ORAL | Status: DC
  Administered 2019-10-02 – 2019-10-05 (×4): 25 mg via ORAL
  Filled 2019-10-02 (×4): qty 1

## 2019-10-02 MED ORDER — POTASSIUM CHLORIDE CRYS ER 20 MEQ PO TBCR
40.0000 meq | EXTENDED_RELEASE_TABLET | Freq: Once | ORAL | Status: AC
  Administered 2019-10-02: 40 meq via ORAL
  Filled 2019-10-02: qty 2

## 2019-10-02 NOTE — Progress Notes (Signed)
[facility]  ROUNDING NOTE   Subjective:   K 3.1  Patient with confusion and agitation this morning.   Objective:  Vital signs in last 24 hours:  Temp:  [98.8 F (37.1 C)] 98.8 F (37.1 C) (05/19 2352) Pulse Rate:  [61-88] 88 (05/20 0838) Resp:  [18-20] 18 (05/20 0743) BP: (126-168)/(65-88) 168/88 (05/20 0743) SpO2:  [96 %-99 %] 96 % (05/19 2352)  Weight change:  Filed Weights   09/29/19 0834  Weight: 65.1 kg    Intake/Output: I/O last 3 completed shifts: In: 120 [P.O.:120] Out: -    Intake/Output this shift:  Total I/O In: 240 [P.O.:240] Out: -   Physical Exam: General: NAD,   Head: Normocephalic, atraumatic. Moist oral mucosal membranes  Eyes: Anicteric, PERRL  Neck: Supple, trachea midline  Lungs:  Clear to auscultation  Heart: Regular rate and rhythm  Abdomen:  Soft, nontender,   Extremities:  no peripheral edema.  Neurologic: Not alert or oriented.   Skin: No lesions        Basic Metabolic Panel: Recent Labs  Lab 09/29/19 0835 09/29/19 0835 09/30/19 0500 10/01/19 0515 10/02/19 0618  NA 144  --  143 145 144  K 2.5*  --  2.7* 2.9* 3.1*  CL 110  --  109 113* 110  CO2 26  --  25 26 23   GLUCOSE 132*  --  136* 86 108*  BUN 14  --  14 17 23   CREATININE 1.35*  --  1.46* 1.48* 1.66*  CALCIUM 8.7*   < > 9.1 8.5* 8.9  MG  --   --  1.8 1.9  --    < > = values in this interval not displayed.    Liver Function Tests: Recent Labs  Lab 09/29/19 0835  AST 26  ALT 17  ALKPHOS 56  BILITOT 0.6  PROT 6.9  ALBUMIN 3.6   No results for input(s): LIPASE, AMYLASE in the last 168 hours. No results for input(s): AMMONIA in the last 168 hours.  CBC: Recent Labs  Lab 09/29/19 0835 09/30/19 0500  WBC 10.2 8.1  HGB 10.6* 10.6*  HCT 31.7* 31.6*  MCV 91.9 93.2  PLT 207 209    Cardiac Enzymes: No results for input(s): CKTOTAL, CKMB, CKMBINDEX, TROPONINI in the last 168 hours.  BNP: Invalid input(s): POCBNP  CBG: Recent Labs  Lab  10/01/19 1700 10/01/19 1920 10/01/19 2354 10/02/19 0329 10/02/19 0741  GLUCAP 126* 198* 96 36 101*    Microbiology: Results for orders placed or performed during the hospital encounter of 09/29/19  SARS Coronavirus 2 by RT PCR (hospital order, performed in [facility] hospital lab) Nasopharyngeal Nasopharyngeal Swab     Status: None   Collection Time: 09/29/19  1:10 PM   Specimen: Nasopharyngeal Swab  Result Value Ref Range Status   SARS Coronavirus 2 NEGATIVE NEGATIVE Final    Comment: (NOTE) SARS-CoV-2 target nucleic acids are NOT DETECTED. The SARS-CoV-2 RNA is generally detectable in upper and lower respiratory specimens during the acute phase of infection. The lowest concentration of SARS-CoV-2 viral copies this assay can detect is 250 copies / mL. A negative result does not preclude SARS-CoV-2 infection and should not be used as the sole basis for treatment or other patient management decisions.  A negative result may occur with improper specimen collection / handling, submission of specimen other than nasopharyngeal swab, presence of viral mutation(s) within the areas targeted by this assay, and inadequate number of viral copies (<250 copies / mL).  A negative result must be combined with clinical observations, patient history, and epidemiological information. Fact Sheet for Patients:   StrictlyIdeas.no Fact Sheet for Healthcare Providers: BankingDealers.co.za This test is not yet approved or cleared  by the [place] FDA and has been authorized for detection and/or diagnosis of SARS-CoV-2 by FDA under an Emergency Use Authorization (EUA).  This EUA will remain in effect (meaning this test can be used) for the duration of the COVID-19 declaration under Section 564(b)(1) of the Act, 21 U.S.C. section 360bbb-3(b)(1), unless the authorization is terminated or revoked sooner. Performed at [facility], [address] [place], [place] [address]     Coagulation Studies: No results for input(s): LABPROT, INR in the last 72 hours.  Urinalysis: No results for input(s): COLORURINE, LABSPEC, PHURINE, GLUCOSEU, HGBUR, BILIRUBINUR, KETONESUR, PROTEINUR, UROBILINOGEN, NITRITE, LEUKOCYTESUR in the last 72 hours.  Invalid input(s): APPERANCEUR    Imaging: No results found.   Medications:    . carvedilol  6.25 mg Oral BID WC  . cholecalciferol  2,000 Units Oral BID  . dabigatran  75 mg Oral BID  . diltiazem  240 mg Oral Daily  . hydrALAZINE  100 mg Oral TID  . insulin aspart  0-9 Units Subcutaneous Q4H  . insulin glargine  6 Units Subcutaneous Q2000  . isosorbide mononitrate  30 mg Oral Daily  . lisinopril  20 mg Oral Daily  . memantine  5 mg Oral BID  . multivitamin with minerals  1 tablet Oral Daily  . potassium chloride  20 mEq Oral Daily  . QUEtiapine  25 mg Oral QHS  . rosuvastatin  40 mg Oral QHS  . vitamin B-12  100 mcg Oral Daily   bisacodyl, LORazepam, ondansetron **OR** ondansetron (ZOFRAN) IV, senna-docusate  Assessment/ Plan:  Justin Manning is a 84 y.o. black male with hypertension, atrial fibrillation, diabetes mellitus type II insulin requiring, BPH who is admitted to [facility] on 09/29/2019 for Hypokalemia [E87.6] Fall [W19.XXXA] Elevated troponin [R77.8]  1. Chronic Kidney Disease stage IIIB:with proteinuria: baseline creatinine of 1.46, GFR of 44 on 09/23/19.  Chronic kidney disease secondary to diabetic nephropathy.   2. Hypertension: urgency on admission. Currently 152/62. Home regimen of hydralazine, hydrochlorothiazide, lisinopril, diltiazem.  - Holding hydrochlorothiazide due to hypokalemia  3. Hypokalemia: history of hyponatremia secondary to hydrochlorothiazide.  Do not restart hydrochlorothiazide.  Magnesium level at goal.  - lisinopril and PO potassium replacement.   4. Delirium: could be due to progression of dementia versus acute from new medications:  memantine and/or mirtazapine. Holding both agents.   Nephrology will follow up as an outpatient. Please call with questions.   LOS: 3 Justin Manning 5/20/202110:41 AM

## 2019-10-02 NOTE — Progress Notes (Signed)
PROGRESS NOTE    Justin Manning  YPP:[id number] DOB: 1935-11-14 DOA: 09/29/2019 PCP: Steele Sizer, MD   Brief Narrative:  Justin [place] Mintzis a 84 y.o.malewith medical history significant ofchronic kidney disease, dementia, diabetes mellitus, hypertension, atrial fibrillation on Pradaxa had left his house earlier this morning morning around and a neighbor sawhim and called the daughter. Apparently patient had fallen. Pt does not remember anything secondary to advanced dementia.  Subjective: Patient was little agitated when seen today.  Per nursing staff he was trying to get out of bed, pulled his IV and telemetry lines.  There is concern for fall.  Assessment & Plan:   Active Problems:   Fall   Hypokalemia   Elevated troponin  Recurrent falls.  Patient had a fall in hospital two days ago, CT head was negative. PT/OT recommending SNF placement. -POC working on bed search and insurance authorization.  Atrial fibrillation.  Currently rate controlled with Cardizem. -Cardizem and Pradaxa.  HFrEF.  New diagnosis, EF of 25 to 30%. Cardiology was consulted-they do not recommend further evaluation and will continue with current management.  Presently appears euvolemic. Might need low-dose antidiuretics as an outpatient. -Continue with lisinopril, statin and Imdur along with diltiazem.  CKD stage IIIa.  Creatinine seems stable, will increase today. -Nephrology is following-appreciate their recommendations.  Hypokalemia.  Magnesium within normal limit. -Continue to replete as needed. -HCTZ was discontinued by nephrology due to concern of persistent hypokalemia.  Hypertension.  Pressure mildly elevated today. -Continue hydralazine and lisinopril. -Hydralazine dose was increased to 100 mg 3 times daily during current hospitalization.  Dementia/delirium.  Delirium associated with advanced dementia is most likely responsible for his sundowning effect and agitation. -Continue  memantine. -Remeron was held due to concern of worsening confusion per daughter. -Add Seroquel at bedtime. -Dose Ativan as needed.  Type 2 diabetes. -Continue SSI  Objective: Vitals:   10/01/19 2155 10/01/19 2352 10/02/19 0743 10/02/19 0838  BP: 136/76 126/65 (!) 168/88   Pulse: 71 75  88  Resp:  20 18   Temp:  98.8 F (37.1 C)    TempSrc:  Oral    SpO2:  96%    Weight:      Height:        Intake/Output Summary (Last 24 hours) at 10/02/2019 1539 Last data filed at 10/02/2019 0900 Gross per 24 hour  Intake 240 ml  Output --  Net 240 ml   Filed Weights   09/29/19 0834  Weight: 65.1 kg    Examination:  General exam: Appears little agitated and does not want to answer any questions. Respiratory system: Clear to auscultation. Respiratory effort normal. Cardiovascular system: S1 & S2 heard, RRR. No JVD, murmurs, rubs, gallops or clicks. Gastrointestinal system: Soft, nontender, nondistended, bowel sounds positive. Central nervous system: Alert . No focal neurological deficits. Extremities: No edema, no cyanosis, pulses intact and symmetrical. Psychiatry: Judgement and insight appear impaired.   DVT prophylaxis: Pradaxa Code Status: Full Family Communication: Son was updated at bedside. Disposition Plan:  Status is: Inpatient  Remains inpatient appropriate because:Inpatient level of care appropriate due to severity of illness   Dispo: The patient is from: Home              Anticipated d/c is to: SNF              Anticipated d/c date is: 1-2 days.              Patient currently is medically stable to d/c.  PT is recommending SNF placement.  TOC to find a bed and insurance authorization.  Consultants:   Cardiology  Nephrology  Procedures:  Antimicrobials:   Data Reviewed: I have personally reviewed following labs and imaging studies  CBC: Recent Labs  Lab 09/29/19 0835 09/30/19 0500  WBC 10.2 8.1  HGB 10.6* 10.6*  HCT 31.7* 31.6*  MCV 91.9 93.2  PLT  207 557   Basic Metabolic Panel: Recent Labs  Lab 09/29/19 0835 09/30/19 0500 10/01/19 0515 10/02/19 0618  NA 144 143 145 144  K 2.5* 2.7* 2.9* 3.1*  CL 110 109 113* 110  CO2 26 25 26 23   GLUCOSE 132* 136* 86 108*  BUN 14 14 17 23   CREATININE 1.35* 1.46* 1.48* 1.66*  CALCIUM 8.7* 9.1 8.5* 8.9  MG  --  1.8 1.9  --    GFR: Estimated Creatinine Clearance: 31.6 mL/min (A) (by C-G formula based on SCr of 1.66 mg/dL (H)). Liver Function Tests: Recent Labs  Lab 09/29/19 0835  AST 26  ALT 17  ALKPHOS 56  BILITOT 0.6  PROT 6.9  ALBUMIN 3.6   No results for input(s): LIPASE, AMYLASE in the last 168 hours. No results for input(s): AMMONIA in the last 168 hours. Coagulation Profile: No results for input(s): INR, PROTIME in the last 168 hours. Cardiac Enzymes: No results for input(s): CKTOTAL, CKMB, CKMBINDEX, TROPONINI in the last 168 hours. BNP (last 3 results) No results for input(s): PROBNP in the last 8760 hours. HbA1C: No results for input(s): HGBA1C in the last 72 hours. CBG: Recent Labs  Lab 10/01/19 1920 10/01/19 2354 10/02/19 0329 10/02/19 0741 10/02/19 1154  GLUCAP 198* 96 87 101* 214*   Lipid Profile: No results for input(s): CHOL, HDL, LDLCALC, TRIG, CHOLHDL, LDLDIRECT in the last 72 hours. Thyroid Function Tests: No results for input(s): TSH, T4TOTAL, FREET4, T3FREE, THYROIDAB in the last 72 hours. Anemia Panel: No results for input(s): VITAMINB12, FOLATE, FERRITIN, TIBC, IRON, RETICCTPCT in the last 72 hours. Sepsis Labs: No results for input(s): PROCALCITON, LATICACIDVEN in the last 168 hours.  Recent Results (from the past 240 hour(s))  SARS Coronavirus 2 by RT PCR (hospital order, performed in [facility] hospital lab) Nasopharyngeal Nasopharyngeal Swab     Status: None   Collection Time: 09/29/19  1:10 PM   Specimen: Nasopharyngeal Swab  Result Value Ref Range Status   SARS Coronavirus 2 NEGATIVE NEGATIVE Final    Comment: (NOTE) SARS-CoV-2  target nucleic acids are NOT DETECTED. The SARS-CoV-2 RNA is generally detectable in upper and lower respiratory specimens during the acute phase of infection. The lowest concentration of SARS-CoV-2 viral copies this assay can detect is 250 copies / mL. A negative result does not preclude SARS-CoV-2 infection and should not be used as the sole basis for treatment or other patient management decisions.  A negative result may occur with improper specimen collection / handling, submission of specimen other than nasopharyngeal swab, presence of viral mutation(s) within the areas targeted by this assay, and inadequate number of viral copies (<250 copies / mL). A negative result must be combined with clinical observations, patient history, and epidemiological information. Fact Sheet for Patients:   StrictlyIdeas.no Fact Sheet for Healthcare Providers: BankingDealers.co.za This test is not yet approved or cleared  by the [place] FDA and has been authorized for detection and/or diagnosis of SARS-CoV-2 by FDA under an Emergency Use Authorization (EUA).  This EUA will remain in effect (meaning this test can be used) for the duration of  the COVID-19 declaration under Section 564(b)(1) of the Act, 21 U.S.C. section 360bbb-3(b)(1), unless the authorization is terminated or revoked sooner. Performed at [facility], [address], [place], [place] [address]      Radiology Studies: No results found.  Scheduled Meds: . carvedilol  6.25 mg Oral BID WC  . cholecalciferol  2,000 Units Oral BID  . dabigatran  75 mg Oral BID  . diltiazem  240 mg Oral Daily  . hydrALAZINE  100 mg Oral TID  . insulin aspart  0-9 Units Subcutaneous Q4H  . insulin glargine  6 Units Subcutaneous Q2000  . isosorbide mononitrate  30 mg Oral Daily  . lisinopril  20 mg Oral Daily  . memantine  5 mg Oral BID  . multivitamin with minerals  1 tablet Oral Daily    . potassium chloride  20 mEq Oral Daily  . QUEtiapine  25 mg Oral QHS  . rosuvastatin  40 mg Oral QHS  . vitamin B-12  100 mcg Oral Daily   Continuous Infusions:   LOS: 3 days   Time spent: 35 minutes.  Lorella Nimrod, MD [facility]  If 7PM-7AM, please contact night-coverage [email or web address]  10/02/2019, 3:39 PM   This record has been created using Systems analyst. Errors have been sought and corrected,but may not always be located. Such creation errors do not reflect on the standard of care.

## 2019-10-02 NOTE — TOC Progression Note (Addendum)
Transition of Care [facility]) - Progression Note    Patient Details  Name: Justin Manning MRN: [id number] Date of Birth: 09-Apr-1936  [facility] CM/SW Contact  Shelbie Ammons, RN Phone Number: 10/02/2019, 10:42 AM  Clinical Narrative:   RNCM left VM for daughter [name] to discuss bed offers. Spoke with son Victoriano at bedside, he reported that he would talk with his sister and get back to [place] later in day.     Expected Discharge Plan: [place] Barriers to Discharge: Continued Medical Work up  Expected Discharge Plan and Services Expected Discharge Plan: [place]   Discharge Planning Services: CM Consult Post Acute Care Choice: [place] Living arrangements for the past 2 months: Apartment                                       Social Determinants of Health (SDOH) Interventions    Readmission Risk Interventions No flowsheet data found.

## 2019-10-02 NOTE — Care Management Important Message (Signed)
Important Message  Patient Details  Name: Justin Manning MRN: [id number] Date of Birth: 1936/01/18   Medicare Important Message Given:  Yes     [name] 10/02/2019, 11:34 AM

## 2019-10-03 LAB — CBC
HCT: 31.6 % — ABNORMAL LOW (ref 39.0–52.0)
Hemoglobin: 10.5 g/dL — ABNORMAL LOW (ref 13.0–17.0)
MCH: 31 pg (ref 26.0–34.0)
MCHC: 33.2 g/dL (ref 30.0–36.0)
MCV: 93.2 fL (ref 80.0–100.0)
Platelets: 241 10*3/uL (ref 150–400)
RBC: 3.39 MIL/uL — ABNORMAL LOW (ref 4.22–5.81)
RDW: 15.9 % — ABNORMAL HIGH (ref 11.5–15.5)
WBC: 7.7 10*3/uL (ref 4.0–10.5)
nRBC: 0 % (ref 0.0–0.2)

## 2019-10-03 LAB — BASIC METABOLIC PANEL
Anion gap: 8 (ref 5–15)
BUN: 26 mg/dL — ABNORMAL HIGH (ref 8–23)
CO2: 24 mmol/L (ref 22–32)
Calcium: 9 mg/dL (ref 8.9–10.3)
Chloride: 111 mmol/L (ref 98–111)
Creatinine, Ser: 1.51 mg/dL — ABNORMAL HIGH (ref 0.61–1.24)
GFR calc Af Amer: 49 mL/min — ABNORMAL LOW (ref >60)
GFR calc non Af Amer: 42 mL/min — ABNORMAL LOW (ref >60)
Glucose, Bld: 114 mg/dL — ABNORMAL HIGH (ref 70–99)
Potassium: 3 mmol/L — ABNORMAL LOW (ref 3.5–5.1)
Sodium: 143 mmol/L (ref 135–145)

## 2019-10-03 LAB — GLUCOSE, CAPILLARY
Glucose-Capillary: 109 mg/dL — ABNORMAL HIGH (ref 70–99)
Glucose-Capillary: 111 mg/dL — ABNORMAL HIGH (ref 70–99)
Glucose-Capillary: 229 mg/dL — ABNORMAL HIGH (ref 70–99)
Glucose-Capillary: 114 mg/dL — ABNORMAL HIGH (ref 70–99)
Glucose-Capillary: 165 mg/dL — ABNORMAL HIGH (ref 70–99)
Glucose-Capillary: 128 mg/dL — ABNORMAL HIGH (ref 70–99)

## 2019-10-03 LAB — MAGNESIUM: Magnesium: 2.1 mg/dL (ref 1.7–2.4)

## 2019-10-03 MED ORDER — CARVEDILOL 3.125 MG PO TABS
6.2500 mg | ORAL_TABLET | ORAL | Status: AC
  Administered 2019-10-03: 6.25 mg via ORAL
  Filled 2019-10-03: qty 2

## 2019-10-03 MED ORDER — DILTIAZEM HCL 30 MG PO TABS
30.0000 mg | ORAL_TABLET | Freq: Once | ORAL | Status: AC
  Administered 2019-10-03: 23:00:00 30 mg via ORAL
  Filled 2019-10-03: qty 1

## 2019-10-03 MED ORDER — POTASSIUM CHLORIDE CRYS ER 20 MEQ PO TBCR
40.0000 meq | EXTENDED_RELEASE_TABLET | Freq: Once | ORAL | Status: AC
  Administered 2019-10-03: 40 meq via ORAL
  Filled 2019-10-03: qty 2

## 2019-10-03 NOTE — Progress Notes (Addendum)
Physical Therapy Treatment Patient Details Name: Justin Manning MRN: [id number] DOB: 02/10/1936 Today's Date: 10/03/2019    History of Present Illness Pt is an 84 yo male who [facility] at [facility] on [facility] after a fall. His PMH includes dementia, chronic kidney disease, DM, HTN and AF on Pradaxa    PT Comments    Pt in bed with inc urine and had removed clothing and blankets.  Seemed unaware he was unclothed.  Assisted with gown.  To EOB with min a x 1.  Pt given verbal and tactile cues to reach for rails but pulls up on my arm despite attempted interventions.  Once sitting, he is generally stable.  Stood with RW and min a x 1 for assist with power up and balance.  He is able to walk to bathroom with RW and min a x 1 but generally unsteady with high fall risk.  He is able to perform his own self care for medium BM and return to chair with min a x 1.  Declines further gait at this time wanting to go to his chair.  He denies fatigue but he does sit quickly upon turning to chair without reaching back in a generally unsafe manner.  Education provided.   Follow Up Recommendations   SNF     Equipment Recommendations  Rolling walker with 5" wheels;3in1 (PT)    Recommendations for Other Services       Precautions / Restrictions Precautions Precautions: Fall Restrictions Weight Bearing Restrictions: No    Mobility  Bed Mobility Overal bed mobility: Needs Assistance Bed Mobility: Supine to Sit     Supine to sit: Min assist     General bed mobility comments: reaches for help and pulls on my arm despite cues to reach for rails  Transfers Overall transfer level: Needs assistance Equipment used: Rolling walker (2 wheeled) Transfers: Sit to/from Stand Sit to Stand: Min assist;Mod assist         General transfer comment: unable to stand without assist to push up and assist for balance.  Ambulation/Gait Ambulation/Gait assistance: Min assist Gait Distance (Feet): 40 Feet Assistive  device: Rolling walker (2 wheeled) Gait Pattern/deviations: Step-to pattern;Decreased step length - right;Decreased step length - left;Shuffle;Trunk flexed Gait velocity: decreased   General Gait Details: ambualted to/from bathroom with RW and min a x 1.  Generally unsteady with several instances where a fall could have occured without outside interventions.   Stairs             Wheelchair Mobility    Modified Rankin (Stroke Patients Only)       Balance Overall balance assessment: Needs assistance;History of Falls Sitting-balance support: Feet supported;No upper extremity supported Sitting balance-Leahy Scale: Fair       Standing balance-Leahy Scale: Poor Standing balance comment: using RW for UE support                            Cognition Arousal/Alertness: Awake/alert Behavior During Therapy: WFL for tasks assessed/performed Overall Cognitive Status: History of cognitive impairments - at baseline                                        Exercises Other Exercises Other Exercises: to commode for medium soft BM.  Relayed to RN.    General Comments        Pertinent Vitals/Pain Pain  Assessment: No/denies pain    Home Living                      Prior Function            PT Goals (current goals can now be found in the care plan section) Progress towards PT goals: Progressing toward goals    Frequency    Min 2X/week      PT Plan Current plan remains appropriate    Co-evaluation              AM-PAC PT "6 Clicks" Mobility   Outcome Measure  Help needed turning from your back to your side while in a flat bed without using bedrails?: A Little Help needed moving from lying on your back to sitting on the side of a flat bed without using bedrails?: A Little Help needed moving to and from a bed to a chair (including a wheelchair)?: A Little Help needed standing up from a chair using your arms (e.g., wheelchair  or bedside chair)?: A Little Help needed to walk in hospital room?: A Little Help needed climbing 3-5 steps with a railing? : A Lot 6 Click Score: 17    End of Session Equipment Utilized During Treatment: Gait belt Activity Tolerance: Patient tolerated treatment well Patient left: in chair;with call bell/phone within reach;with chair alarm set;Other (comment)(fall mat in place.) Nurse Communication: Mobility status       Time: 3151-7616 PT Time Calculation (min) (ACUTE ONLY): 23 min  Charges:  $Gait Training: 8-22 mins $Therapeutic Activity: 8-22 mins                    Chesley Noon, PTA 10/03/19, 9:04 AM

## 2019-10-03 NOTE — TOC Progression Note (Signed)
Transition of Care [facility]) - Progression Note    Patient Details  Name: Justin Manning MRN: [id number] Date of Birth: 1936-01-12  Transition of Care [facility]) CM/SW Contact  Shelbie Ammons, RN Phone Number: 10/03/2019, 2:58 PM  Clinical Narrative:    9:30am- RNCM received call from [facility] with [employer], she reported that per her DON some behaviors had been noted in the chart from yesterday PM and that patient would need to be at least 24 hours without any noted behaviors before they could take him.    11am- Spoke with both MD and bedside nurse during IPR rounds, patient has been much calmer today. Dr. Reesa Chew reports that it is her plan to contact daughter today to discuss patient's current status.    2:30pm- Placed call to [name] to discuss discharge planning. Reviewed with daughter information that had been shared by MD and that patient will likely not be able to transfer to SNF if he continues to need 24 hour 1:1 care and this is not something that they can assure. Daughter verbalizes understanding and reports that she called facility yesterday and they encouraged her to begin process for Medicaid. She also verbalizes that she did not tell facility who she was and was asking questions "hypothetically". Daughter questioning why if patient may have problems with being under skilled care that we are not seeking long term placement. Attempted to discuss that since patient does not have financial resources for long term placement this is not something we would be able to arrange. Spoke for a short time longer with daughter until she finally said that she needed some time to think over the options that had been presented to her and she would get back to [place]. Again relayed that when patient is medically stable for discharge it will be important for [place] to have a plan in place.    3:00pm- RNCM reached out to [place] with [employer] again to discuss, did inform her that patient has been without behaviors  today and there had been no problems reported. Discussed that patient's daughter may reach out to her for further questions.  RNCM will continue to monitor and assist with disposition.     Expected Discharge Plan: [place] Barriers to Discharge: Continued Medical Work up  Expected Discharge Plan and Services Expected Discharge Plan: [place]   Discharge Planning Services: CM Consult Post Acute Care Choice: [place] Living arrangements for the past 2 months: Apartment                                       Social Determinants of Health (SDOH) Interventions    Readmission Risk Interventions No flowsheet data found.

## 2019-10-03 NOTE — Progress Notes (Signed)
PROGRESS NOTE    Justin Manning  ZHG:[id number] DOB: 1936-05-09 DOA: 09/29/2019 PCP: Steele Sizer, MD   Brief Narrative:  Justin Manning a 84 y.o.malewith medical history significant ofchronic kidney disease, dementia, diabetes mellitus, hypertension, atrial fibrillation on Pradaxa had left his house earlier this morning morning around and a neighbor sawhim and called the daughter. Apparently patient had fallen. Pt does not remember anything secondary to advanced dementia.  Subjective: Patient was much more calmer today.  No new complaints.  He was sitting comfortably in chair.  Son was in the room.  Assessment & Plan:   Active Problems:   Fall   Hypokalemia   Elevated troponin  Recurrent falls.  Patient had a fall in hospital few days ago, CT head was negative. PT/OT recommending SNF placement. -POC working on bed search and insurance authorization, patient has a bed at [facility] but apparently requiring sitter due to delirium.  Atrial fibrillation.  Currently rate controlled with Cardizem. -Cardizem and Pradaxa.  HFrEF.  New diagnosis, EF of 25 to 30%. Cardiology was consulted-they do not recommend further evaluation and will continue with current management.  Presently appears euvolemic. Might need low-dose antidiuretics as an outpatient. -Continue with lisinopril, statin and Imdur along with diltiazem.  CKD stage IIIa.  Creatinine seems stable. -Nephrology is following-appreciate their recommendations.  Hypokalemia.  Magnesium within normal limit. -Continue to replete as needed. -HCTZ was discontinued by nephrology due to concern of persistent hypokalemia.  Hypertension.  Pressure within goal today. -Continue hydralazine and lisinopril. -Hydralazine dose was increased to 100 mg 3 times daily during current hospitalization.  Dementia/delirium.  Delirium associated with advanced dementia is most likely responsible for his sundowning effect and  agitation. -Continue memantine. -Remeron was held due to concern of worsening confusion per daughter. -Continue Seroquel at bedtime. -Dose Ativan as needed. -We will try to avoid sitter.  Type 2 diabetes. -Continue SSI  Objective: Vitals:   10/02/19 0838 10/02/19 1716 10/03/19 0032 10/03/19 0810  BP:  (!) 143/84 (!) 153/76 97/62  Pulse: 88 82 76 77  Resp:  18 18 17   Temp:  98.7 F (37.1 C) 98.3 F (36.8 C) 97.7 F (36.5 C)  TempSrc:  Oral Oral Axillary  SpO2:  96% 99% 99%  Weight:      Height:       No intake or output data in the 24 hours ending 10/03/19 1215 Filed Weights   09/29/19 0834  Weight: 65.1 kg    Examination:  General exam: Appears calm and comfortable. Respiratory system: Clear to auscultation. Respiratory effort normal. Cardiovascular system: S1 & S2 heard, RRR. No JVD, murmurs, rubs, gallops or clicks. Gastrointestinal system: Soft, nontender, nondistended, bowel sounds positive. Central nervous system: Alert . No focal neurological deficits. Extremities: No edema, no cyanosis, pulses intact and symmetrical. Psychiatry: Judgement and insight appear impaired.   DVT prophylaxis: Pradaxa Code Status: Full Family Communication: Son was updated at bedside.  Talked with daughter on phone and explained to them that we need family help to spend some time with him to prevent this sundowning which is a barrier for his discharge.  Disposition Plan:  Status is: Inpatient  Remains inpatient appropriate because:Inpatient level of care appropriate due to severity of illness   Dispo: The patient is from: Home              Anticipated d/c is to: SNF              Anticipated d/c date is: 1-2  days.              Patient currently is medically stable to d/c.  PT is recommending SNF placement.  Patient has a bed at [facility] but he was requiring intermittent sitter due to his sundowning and agitation.  Requested with his son to spend some time with him so we can  avoid sitter.  Per daughter they are very busy and cannot take care of him but will try to come for couple of hours daily.  They refused to take him back home.  Consultants:   Cardiology-signed off  Nephrology  Procedures:  Antimicrobials:   Data Reviewed: I have personally reviewed following labs and imaging studies  CBC: Recent Labs  Lab 09/29/19 0835 09/30/19 0500 10/03/19 0559  WBC 10.2 8.1 7.7  HGB 10.6* 10.6* 10.5*  HCT 31.7* 31.6* 31.6*  MCV 91.9 93.2 93.2  PLT 207 209 588   Basic Metabolic Panel: Recent Labs  Lab 09/29/19 0835 09/30/19 0500 10/01/19 0515 10/02/19 0618 10/03/19 0559  NA 144 143 145 144 143  K 2.5* 2.7* 2.9* 3.1* 3.0*  CL 110 109 113* 110 111  CO2 26 25 26 23 24   GLUCOSE 132* 136* 86 108* 114*  BUN 14 14 17 23  26*  CREATININE 1.35* 1.46* 1.48* 1.66* 1.51*  CALCIUM 8.7* 9.1 8.5* 8.9 9.0  MG  --  1.8 1.9  --  2.1   GFR: Estimated Creatinine Clearance: 34.7 mL/min (A) (by C-G formula based on SCr of 1.51 mg/dL (H)). Liver Function Tests: Recent Labs  Lab 09/29/19 0835  AST 26  ALT 17  ALKPHOS 56  BILITOT 0.6  PROT 6.9  ALBUMIN 3.6   No results for input(s): LIPASE, AMYLASE in the last 168 hours. No results for input(s): AMMONIA in the last 168 hours. Coagulation Profile: No results for input(s): INR, PROTIME in the last 168 hours. Cardiac Enzymes: No results for input(s): CKTOTAL, CKMB, CKMBINDEX, TROPONINI in the last 168 hours. BNP (last 3 results) No results for input(s): PROBNP in the last 8760 hours. HbA1C: No results for input(s): HGBA1C in the last 72 hours. CBG: Recent Labs  Lab 10/02/19 2001 10/02/19 2342 10/03/19 0407 10/03/19 0806 10/03/19 1129  GLUCAP 196* 121* 109* 111* 229*   Lipid Profile: No results for input(s): CHOL, HDL, LDLCALC, TRIG, CHOLHDL, LDLDIRECT in the last 72 hours. Thyroid Function Tests: No results for input(s): TSH, T4TOTAL, FREET4, T3FREE, THYROIDAB in the last 72 hours. Anemia  Panel: No results for input(s): VITAMINB12, FOLATE, FERRITIN, TIBC, IRON, RETICCTPCT in the last 72 hours. Sepsis Labs: No results for input(s): PROCALCITON, LATICACIDVEN in the last 168 hours.  Recent Results (from the past 240 hour(s))  SARS Coronavirus 2 by RT PCR (hospital order, performed in [facility] hospital lab) Nasopharyngeal Nasopharyngeal Swab     Status: None   Collection Time: 09/29/19  1:10 PM   Specimen: Nasopharyngeal Swab  Result Value Ref Range Status   SARS Coronavirus 2 NEGATIVE NEGATIVE Final    Comment: (NOTE) SARS-CoV-2 target nucleic acids are NOT DETECTED. The SARS-CoV-2 RNA is generally detectable in upper and lower respiratory specimens during the acute phase of infection. The lowest concentration of SARS-CoV-2 viral copies this assay can detect is 250 copies / mL. A negative result does not preclude SARS-CoV-2 infection and should not be used as the sole basis for treatment or other patient management decisions.  A negative result may occur with improper specimen collection / handling, submission of specimen other than  nasopharyngeal swab, presence of viral mutation(s) within the areas targeted by this assay, and inadequate number of viral copies (<250 copies / mL). A negative result must be combined with clinical observations, patient history, and epidemiological information. Fact Sheet for Patients:   StrictlyIdeas.no Fact Sheet for Healthcare Providers: BankingDealers.co.za This test is not yet approved or cleared  by the [place] FDA and has been authorized for detection and/or diagnosis of SARS-CoV-2 by FDA under an Emergency Use Authorization (EUA).  This EUA will remain in effect (meaning this test can be used) for the duration of the COVID-19 declaration under Section 564(b)(1) of the Act, 21 U.S.C. section 360bbb-3(b)(1), unless the authorization is terminated or revoked sooner. Performed at  [facility], [address] [place], [place] [address]      Radiology Studies: No results found.  Scheduled Meds: . carvedilol  6.25 mg Oral BID WC  . cholecalciferol  2,000 Units Oral BID  . dabigatran  75 mg Oral BID  . diltiazem  240 mg Oral Daily  . hydrALAZINE  100 mg Oral TID  . insulin aspart  0-9 Units Subcutaneous Q4H  . insulin glargine  6 Units Subcutaneous Q2000  . isosorbide mononitrate  30 mg Oral Daily  . lisinopril  20 mg Oral Daily  . memantine  5 mg Oral BID  . multivitamin with minerals  1 tablet Oral Daily  . potassium chloride  20 mEq Oral Daily  . potassium chloride  40 mEq Oral Once  . QUEtiapine  25 mg Oral QHS  . rosuvastatin  40 mg Oral QHS  . vitamin B-12  100 mcg Oral Daily   Continuous Infusions:   LOS: 4 days   Time spent: 35 minutes.  Lorella Nimrod, MD [facility]  If 7PM-7AM, please contact night-coverage [email or web address]  10/03/2019, 12:15 PM   This record has been created using Systems analyst. Errors have been sought and corrected,but may not always be located. Such creation errors do not reflect on the standard of care.

## 2019-10-03 NOTE — Progress Notes (Signed)
Pt with elevated BP. Scheduled BP medications appear to be not given earlier in day. Schedule BP medication administered additional NP notified and new orders placed. Pt is otherwise stable with no complaints at this time. Remaining VSS. Continuing to monitor.   10/03/19 2133  Assess: MEWS Score  Temp 97.9 F (36.6 C)  BP (!) 202/167  Pulse Rate 73  Resp 16  SpO2 100 %  O2 Device Room Air  Assess: MEWS Score  MEWS Temp 0  MEWS Systolic 2  MEWS Pulse 0  MEWS RR 0  MEWS LOC 0  MEWS Score 2  MEWS Score Color Yellow  Assess: if the MEWS score is Yellow or Red  Were vital signs taken at a resting state? Yes  Focused Assessment Documented focused assessment (shift assessment)  Early Detection of Sepsis Score *See Row Information* Low  MEWS guidelines implemented *See Row Information* Yes  Treat  MEWS Interventions Administered scheduled meds/treatments;Other (Comment) (notified MD)  Take Vital Signs  Increase Vital Sign Frequency  Yellow: Q 2hr X 2 then Q 4hr X 2, if remains yellow, continue Q 4hrs  Escalate  MEWS: Escalate Yellow: discuss with charge nurse/RN and consider discussing with provider and RRT  Notify: Charge Nurse/RN  Name of Charge Nurse/RN Notified Beather Arbour, RN (Assisned Nurse is Camera operator peer RN notified)  Date Charge Nurse/RN Notified 10/03/19  Time Charge Nurse/RN Notified 2140  Notify: Provider  Provider Name/Title B. Randol Kern, NP  Date Provider Notified 10/03/19  Time Provider Notified 2140  Notification Type  (Hiku message)  Notification Reason Other (Comment) (elevated BP)  Response See new orders  Date of Provider Response 10/03/19  Time of Provider Response 2145  Document  Patient Outcome Other (Comment) (continuing to monitor)

## 2019-10-04 LAB — GLUCOSE, CAPILLARY
Glucose-Capillary: 80 mg/dL (ref 70–99)
Glucose-Capillary: 68 mg/dL — ABNORMAL LOW (ref 70–99)
Glucose-Capillary: 75 mg/dL (ref 70–99)
Glucose-Capillary: 133 mg/dL — ABNORMAL HIGH (ref 70–99)
Glucose-Capillary: 147 mg/dL — ABNORMAL HIGH (ref 70–99)
Glucose-Capillary: 173 mg/dL — ABNORMAL HIGH (ref 70–99)

## 2019-10-04 LAB — BASIC METABOLIC PANEL
Anion gap: 9 (ref 5–15)
BUN: 24 mg/dL — ABNORMAL HIGH (ref 8–23)
CO2: 25 mmol/L (ref 22–32)
Calcium: 9.1 mg/dL (ref 8.9–10.3)
Chloride: 109 mmol/L (ref 98–111)
Creatinine, Ser: 1.57 mg/dL — ABNORMAL HIGH (ref 0.61–1.24)
GFR calc Af Amer: 47 mL/min — ABNORMAL LOW (ref >60)
GFR calc non Af Amer: 40 mL/min — ABNORMAL LOW (ref >60)
Glucose, Bld: 81 mg/dL (ref 70–99)
Potassium: 3.5 mmol/L (ref 3.5–5.1)
Sodium: 143 mmol/L (ref 135–145)

## 2019-10-04 NOTE — TOC Progression Note (Signed)
Transition of Care [facility]) - Progression Note    Patient Details  Name: Justin Manning MRN: [id number] Date of Birth: 23-Dec-1935  [facility] CM/SW Contact  Boris Sharper, LCSW Phone Number: 10/04/2019, 11:26 AM  Clinical Narrative:    CSW was contacted by MD about pt's discharge plans. MD stated that pt was medically clear and could go to [facility] today. CSW called Magda Paganini with [facility] and she stated that the she was instructed by her director to wait and accept admission on Monday to make sure he was behavior free. Magda Paganini also stated that they could not get his medications at this time so admission would have to wait until Monday morning.    Expected Discharge Plan: [place] Barriers to Discharge: Continued Medical Work up  Expected Discharge Plan and Services Expected Discharge Plan: [place]   Discharge Planning Services: CM Consult Post Acute Care Choice: [place] Living arrangements for the past 2 months: Apartment                                       Social Determinants of Health (SDOH) Interventions    Readmission Risk Interventions No flowsheet data found.

## 2019-10-04 NOTE — Progress Notes (Signed)
PROGRESS NOTE    Justin Manning  OMV:[id number] DOB: [birth year] DOA: 09/29/2019 PCP: Justin Sizer, MD   Brief Narrative:  Justin Manning a 84 y.o.malewith medical history significant ofchronic kidney disease, dementia, diabetes mellitus, hypertension, atrial fibrillation on Pradaxa had left his house earlier this morning morning around and a neighbor sawhim and called the daughter. Apparently patient had fallen. Pt does not remember anything secondary to advanced dementia.  Subjective: Patient was calmer today.  He was alert and oriented and was very pleasant.  No nursing concern overnight.  Assessment & Plan:   Active Problems:   Fall   Hypokalemia   Elevated troponin  Recurrent falls.  Patient had a fall in hospital few days ago, CT head was negative. PT/OT recommending SNF placement. -POC working on bed search and insurance authorization, patient has a bed at [facility] who refused to take him yesterday as he was requiring a Actuary at night prior.  Patient is much calmer for more than 24 hours now, they refused to take him today also stating that they will take him on Monday if he remains calmer over the weekend.  Atrial fibrillation.  Currently rate controlled with Cardizem. -Cardizem and Pradaxa.  HFrEF.  New diagnosis, EF of 25 to 30%. Cardiology was consulted-they do not recommend further evaluation and will continue with current management.  Presently appears euvolemic. Might need low-dose antidiuretics as an outpatient. -Continue with lisinopril, statin and Imdur along with diltiazem.  CKD stage IIIa.  Creatinine seems stable. -Nephrology is following-appreciate their recommendations.  Hypokalemia.  Resolved today. magnesium within normal limit. -Continue to replete as needed. -HCTZ was discontinued by nephrology due to concern of persistent hypokalemia.  Hypertension.  Pressure within goal today. -Continue hydralazine and lisinopril. -Hydralazine  dose was increased to 100 mg 3 times daily during current hospitalization.  Dementia/delirium.  Delirium associated with advanced dementia is most likely responsible for his sundowning effect and agitation.  Much more calmer now for the past 24-hour. -Continue memantine. -Remeron was held due to concern of worsening confusion per daughter. -Continue Seroquel at bedtime. -Continue Ativan as needed. -We will try to avoid sitter.  Type 2 diabetes. -Continue SSI  Objective: Vitals:   10/04/19 0130 10/04/19 0146 10/04/19 0515 10/04/19 0851  BP: (!) 153/69 (!) 143/79 (!) 157/78 (!) 155/65  Pulse: 75 64 74 78  Resp: 15 15 16 17   Temp: (!) 97.5 F (36.4 C) (!) 97.5 F (36.4 C) 97.8 F (36.6 C) 98.3 F (36.8 C)  TempSrc: Oral Oral Oral Oral  SpO2: 100% 100% 97% 95%  Weight:      Height:        Intake/Output Summary (Last 24 hours) at 10/04/2019 1247 Last data filed at 10/04/2019 0500 Gross per 24 hour  Intake --  Output 150 ml  Net -150 ml   Filed Weights   09/29/19 0834  Weight: 65.1 kg    Examination:  General exam: Appears calm and comfortable. Respiratory system: Clear to auscultation. Respiratory effort normal. Cardiovascular system: S1 & S2 heard, RRR. No JVD, murmurs, rubs, gallops or clicks. Gastrointestinal system: Soft, nontender, nondistended, bowel sounds positive. Central nervous system: Alert and oriented. No focal neurological deficits. Extremities: No edema, no cyanosis, pulses intact and symmetrical. Psychiatry: Judgement and insight appear normal.   DVT prophylaxis: Pradaxa Code Status: Full Family Communication: Daughter was updated on phone. Disposition Plan:  Status is: Inpatient  Remains inpatient appropriate because:Inpatient level of care appropriate due to severity of illness  Dispo: The patient is from: Home              Anticipated d/c is to: SNF              Anticipated d/c date is: 1-2 days.              Patient currently is medically  stable to d/c.  PT is recommending SNF placement.  Patient has a bed at [facility] take him yesterday stating that he was agitated overnight.  Contacted them again this morning as there was no nursing concern or agitation over the past 24-hour, they will take him on Monday if he remains calmer over the weekend??  Consultants:   Cardiology-signed off  Nephrology  Procedures:  Antimicrobials:   Data Reviewed: I have personally reviewed following labs and imaging studies  CBC: Recent Labs  Lab 09/29/19 0835 09/30/19 0500 10/03/19 0559  WBC 10.2 8.1 7.7  HGB 10.6* 10.6* 10.5*  HCT 31.7* 31.6* 31.6*  MCV 91.9 93.2 93.2  PLT 207 209 109   Basic Metabolic Panel: Recent Labs  Lab 09/30/19 0500 10/01/19 0515 10/02/19 0618 10/03/19 0559 10/04/19 0658  NA 143 145 144 143 143  K 2.7* 2.9* 3.1* 3.0* 3.5  CL 109 113* 110 111 109  CO2 25 26 23 24 25   GLUCOSE 136* 86 108* 114* 81  BUN 14 17 23  26* 24*  CREATININE 1.46* 1.48* 1.66* 1.51* 1.57*  CALCIUM 9.1 8.5* 8.9 9.0 9.1  MG 1.8 1.9  --  2.1  --    GFR: Estimated Creatinine Clearance: 33.4 mL/min (A) (by C-G formula based on SCr of 1.57 mg/dL (H)). Liver Function Tests: Recent Labs  Lab 09/29/19 0835  AST 26  ALT 17  ALKPHOS 56  BILITOT 0.6  PROT 6.9  ALBUMIN 3.6   No results for input(s): LIPASE, AMYLASE in the last 168 hours. No results for input(s): AMMONIA in the last 168 hours. Coagulation Profile: No results for input(s): INR, PROTIME in the last 168 hours. Cardiac Enzymes: No results for input(s): CKTOTAL, CKMB, CKMBINDEX, TROPONINI in the last 168 hours. BNP (last 3 results) No results for input(s): PROBNP in the last 8760 hours. HbA1C: No results for input(s): HGBA1C in the last 72 hours. CBG: Recent Labs  Lab 10/03/19 2315 10/04/19 0416 10/04/19 0850 10/04/19 0911 10/04/19 1200  GLUCAP 128* 80 68* 75 133*   Lipid Profile: No results for input(s): CHOL, HDL, LDLCALC, TRIG, CHOLHDL, LDLDIRECT  in the last 72 hours. Thyroid Function Tests: No results for input(s): TSH, T4TOTAL, FREET4, T3FREE, THYROIDAB in the last 72 hours. Anemia Panel: No results for input(s): VITAMINB12, FOLATE, FERRITIN, TIBC, IRON, RETICCTPCT in the last 72 hours. Sepsis Labs: No results for input(s): PROCALCITON, LATICACIDVEN in the last 168 hours.  Recent Results (from the past 240 hour(s))  SARS Coronavirus 2 by RT PCR (hospital order, performed in [facility] hospital lab) Nasopharyngeal Nasopharyngeal Swab     Status: None   Collection Time: 09/29/19  1:10 PM   Specimen: Nasopharyngeal Swab  Result Value Ref Range Status   SARS Coronavirus 2 NEGATIVE NEGATIVE Final    Comment: (NOTE) SARS-CoV-2 target nucleic acids are NOT DETECTED. The SARS-CoV-2 RNA is generally detectable in upper and lower respiratory specimens during the acute phase of infection. The lowest concentration of SARS-CoV-2 viral copies this assay can detect is 250 copies / mL. A negative result does not preclude SARS-CoV-2 infection and should not be used as the sole basis for  treatment or other patient management decisions.  A negative result may occur with improper specimen collection / handling, submission of specimen other than nasopharyngeal swab, presence of viral mutation(s) within the areas targeted by this assay, and inadequate number of viral copies (<250 copies / mL). A negative result must be combined with clinical observations, patient history, and epidemiological information. Fact Sheet for Patients:   StrictlyIdeas.no Fact Sheet for Healthcare Providers: BankingDealers.co.za This test is not yet approved or cleared  by the [place] FDA and has been authorized for detection and/or diagnosis of SARS-CoV-2 by FDA under an Emergency Use Authorization (EUA).  This EUA will remain in effect (meaning this test can be used) for the duration of the COVID-19 declaration  under Section 564(b)(1) of the Act, 21 U.S.C. section 360bbb-3(b)(1), unless the authorization is terminated or revoked sooner. Performed at [facility], [address], [place], [place] [address]      Radiology Studies: No results found.  Scheduled Meds: . carvedilol  6.25 mg Oral BID WC  . cholecalciferol  2,000 Units Oral BID  . dabigatran  75 mg Oral BID  . diltiazem  240 mg Oral Daily  . hydrALAZINE  100 mg Oral TID  . insulin aspart  0-9 Units Subcutaneous Q4H  . insulin glargine  6 Units Subcutaneous Q2000  . isosorbide mononitrate  30 mg Oral Daily  . lisinopril  20 mg Oral Daily  . memantine  5 mg Oral BID  . multivitamin with minerals  1 tablet Oral Daily  . potassium chloride  20 mEq Oral Daily  . QUEtiapine  25 mg Oral QHS  . rosuvastatin  40 mg Oral QHS  . vitamin B-12  100 mcg Oral Daily   Continuous Infusions:   LOS: 5 days   Time spent: 35 minutes.  Lorella Nimrod, MD [facility]  If 7PM-7AM, please contact night-coverage [email or web address]  10/04/2019, 12:47 PM   This record has been created using Systems analyst. Errors have been sought and corrected,but may not always be located. Such creation errors do not reflect on the standard of care.

## 2019-10-04 NOTE — Discharge Summary (Signed)
Physician Discharge Summary  Justin Manning MVE:[id number] DOB: 08-10-1935 DOA: 09/29/2019  PCP: Steele Sizer, MD  Admit date: 09/29/2019 Discharge date: 10/06/2019  Admitted From: Home Disposition:  SNF  Recommendations for Outpatient Follow-up:  1. Follow up with PCP in 1-2 weeks 2. Please obtain BMP/CBC in one week 3. Please follow up on the following pending results:None  Home Health:No Equipment/Devices: None Discharge Condition: Fair CODE STATUS: Full Diet recommendation: Heart Healthy / Carb Modified   Brief/Interim Summary: Justin Manning a 84 y.o.malewith medical history significant ofchronic kidney disease, dementia, diabetes mellitus, hypertension, atrial fibrillation on Pradaxa had left his house earlier this morning morning around and a neighbor sawhim and called the daughter. Apparently patient had fallen. Pt does not remember anything secondary to advanced dementia. Patient had another fall during hospitalization without any acute injury. Our physical therapist evaluated him and they were recommending rehab.  Patient is being discharged to rehab.  There was some family concern about his dementia, as he kept wandering in the neighborhood.  Family will decide later whether they want a long-term placement in a memory care unit.  Patient did develop some delirium while in the hospital, initially requiring sitter.  Responded well to Seroquel at bedtime.  He will continue Seroquel at bedtime on discharge.  He remained calm without any sitter or nursing concern for the past 2 to 3 days.  And has a new diagnosis of HFrEF, EF of 25 to 30%.Cardiology was consulted-they do not recommend further evaluation and will continue with current management.  Presently appears euvolemic.  Currently patient is being discharged on lisinopril, statin and Imdur along will diltiazem for atrial fibrillation.  Rate is well controlled and he will continue with Pradaxa.  Will need a close  follow-up with cardiology and might require low-dose antidiuretics if becoming volume overload.  Patient did develop recurrent hypokalemia while hospitalization.  Magnesium was mostly in the normal limit.  He was on HCTZ at home which was discontinued by nephrology for the concern of persistent hypokalemia.  Hypokalemia resolved with repletion.  Initially his blood pressure was elevated.  His hydralazine dose was increased 200 mg 3 times a day during current hospitalization.  Blood pressure was within goal on discharge.  He will continue rest of his home meds.  Discharge Diagnoses:  Active Problems:   Fall   Hypokalemia   Elevated troponin  Discharge Instructions  Discharge Instructions    Diet - low sodium heart healthy   Complete by: As directed    Increase activity slowly   Complete by: As directed      Allergies as of 10/06/2019      Reactions   Aspirin    tachycardia      Medication List    STOP taking these medications   hydrochlorothiazide 12.5 MG tablet Commonly known as: HYDRODIURIL   mirtazapine 15 MG tablet Commonly known as: Remeron   potassium chloride SA 20 MEQ tablet Commonly known as: KLOR-CON     TAKE these medications   B-D ULTRAFINE III SHORT PEN 31G X 8 MM Misc Generic drug: Insulin Pen Needle USE DAILY WITH LANTUS   bisacodyl 5 MG EC tablet Commonly known as: DULCOLAX Take 1 tablet (5 mg total) by mouth daily as needed for moderate constipation.   Cartia XT 240 MG 24 hr capsule Generic drug: diltiazem Take 1 capsule by mouth daily.   carvedilol 6.25 MG tablet Commonly known as: COREG Take 1 tablet (6.25 mg total) by mouth 2 (two)  times daily with a meal.   Cholecalciferol 50 MCG (2000 UT) Caps Take 2,000 Units by mouth 2 (two) times daily.   FREESTYLE LITE test strip Generic drug: glucose blood TEST BLOOD SUGAR THREE TIMES DAILY AS DIRECTED..   hydrALAZINE 100 MG tablet Commonly known as: APRESOLINE Take 1 tablet (100 mg total)  by mouth 3 (three) times daily. What changed:   medication strength  how much to take   isosorbide mononitrate 30 MG 24 hr tablet Commonly known as: IMDUR Take 1 tablet (30 mg total) by mouth daily.   Lantus SoloStar 100 UNIT/ML Solostar Pen Generic drug: insulin glargine INJECT 8 UNITS UNDER THE SKIN DAILY What changed: See the new instructions.   lisinopril 20 MG tablet Commonly known as: ZESTRIL TAKE 1 TABLET(20 MG) BY MOUTH TWICE DAILY   memantine 5 MG tablet Commonly known as: Namenda Take 1 tablet (5 mg total) by mouth 2 (two) times daily.   multivitamin with minerals Tabs tablet Take 1 tablet by mouth daily.   ondansetron 4 MG tablet Commonly known as: ZOFRAN Take 1 tablet (4 mg total) by mouth every 6 (six) hours as needed for nausea.   polyethylene glycol 17 g packet Commonly known as: MIRALAX / GLYCOLAX Take 17 g by mouth daily as needed for mild constipation or moderate constipation.   Pradaxa 75 MG Caps capsule Generic drug: dabigatran Take 75 mg by mouth 2 (two) times daily.   QUEtiapine 25 MG tablet Commonly known as: SEROQUEL Take 1 tablet (25 mg total) by mouth at bedtime.   rosuvastatin 40 MG tablet Commonly known as: CRESTOR TAKE 1 TABLET BY MOUTH DAILY   Vitamin B 12 100 MCG Lozg Take 100 mcg by mouth daily.      Contact information for after-discharge care    [facility] .   Service: Skilled Nursing Contact information: [address] [place] [phone]             Allergies  Allergen Reactions  . Aspirin     tachycardia    Consultations:  Nephrology  Cardiology  Procedures/Studies: DG Knee 2 Views Right  Result Date: 09/29/2019 CLINICAL DATA:  Fall this morning. Right knee pain. Initial encounter. EXAM: RIGHT KNEE - 2 VIEW COMPARISON:  None. FINDINGS: No evidence of fracture, dislocation, or joint effusion. Total knee arthroplasty is seen with  all 3 components in expected position. Extensive peripheral vascular calcification noted. IMPRESSION: No acute findings. Electronically Signed   By: Marlaine Hind M.D.   On: 09/29/2019 14:26   CT Head Wo Contrast  Result Date: 09/29/2019 CLINICAL DATA:  Altered mental status EXAM: CT HEAD WITHOUT CONTRAST TECHNIQUE: Contiguous axial images were obtained from the base of the skull through the vertex without intravenous contrast. COMPARISON:  01/09/2013 FINDINGS: Brain: No evidence of acute infarction, hemorrhage, hydrocephalus, extra-axial collection or mass lesion/mass effect. Extensive low-density changes within the periventricular and subcortical white matter compatible with chronic microvascular ischemic change. Moderate diffuse cerebral volume loss. Vascular: Atherosclerotic calcifications involving the large vessels of the skull base. No unexpected hyperdense vessel. Skull: Normal. Negative for fracture or focal lesion. Sinuses/Orbits: Partially opacified bilateral mastoid air cells. Paranasal sinuses are clear. Orbital structures appear unremarkable. Other: None. IMPRESSION: 1. No acute intracranial findings. 2. Chronic microvascular ischemic change and cerebral volume loss. 3. Partially opacified bilateral mastoid air cells. Electronically Signed   By: Davina Poke D.O.   On: 09/29/2019 12:41   ECHOCARDIOGRAM COMPLETE  Result Date: 09/30/2019    ECHOCARDIOGRAM REPORT   Patient Name:   Justin Manning Date of Exam: 09/29/2019 Medical Rec #:  [id number]        Height:       68.0 in Accession #:    [id number]       Weight:       143.5 lb Date of Birth:  May 31, 1935        BSA:          1.775 m Patient Age:    77 years         BP:           142/107 mmHg Patient Gender: M                HR:           86 bpm. Exam Location:  [facility] Procedure: 2D Echo, Cardiac Doppler and Color Doppler Indications:     NSTEMI I21.4  History:         Patient has no prior history of Echocardiogram examinations.                   Signs/Symptoms:Murmur; Risk Factors:Hypertension.  Sonographer:     Alyse Low Roar Referring Phys:  [id number] Nolberto Hanlon Diagnosing Phys: Serafina Royals MD IMPRESSIONS  1. Left ventricular ejection fraction, by estimation, is 25 to 30%. The left ventricle has severely decreased function. The left ventricle demonstrates global hypokinesis. The left ventricular internal cavity size was mildly dilated. Left ventricular diastolic function could not be evaluated.  2. Right ventricular systolic function is normal. The right ventricular size is normal. There is moderately elevated pulmonary artery systolic pressure.  3. Left atrial size was mildly dilated.  4. Right atrial size was mildly dilated.  5. The mitral valve is normal in structure. Moderate mitral valve regurgitation.  6. Tricuspid valve regurgitation is moderate.  7. The aortic valve is normal in structure. Aortic valve regurgitation is trivial. FINDINGS  Left Ventricle: Left ventricular ejection fraction, by estimation, is 25 to 30%. The left ventricle has severely decreased function. The left ventricle demonstrates global hypokinesis. The left ventricular internal cavity size was mildly dilated. There is no left ventricular hypertrophy. Left ventricular diastolic function could not be evaluated. Right Ventricle: The right ventricular size is normal. No increase in right ventricular wall thickness. Right ventricular systolic function is normal. There is moderately elevated pulmonary artery systolic pressure. The tricuspid regurgitant velocity is 3.37 m/s, and with an assumed right atrial pressure of 10 mmHg, the estimated right ventricular systolic pressure is 30.8 mmHg. Left Atrium: Left atrial size was mildly dilated. Right Atrium: Right atrial size was mildly dilated. Pericardium: There is no evidence of pericardial effusion. Mitral Valve: The mitral valve is normal in structure. Moderate mitral valve regurgitation. Tricuspid Valve: The tricuspid valve is  normal in structure. Tricuspid valve regurgitation is moderate. Aortic Valve: The aortic valve is normal in structure. Aortic valve regurgitation is trivial. Aortic valve mean gradient measures 9.0 mmHg. Aortic valve peak gradient measures 15.1 mmHg. Aortic valve area, by VTI measures 1.20 cm. Pulmonic Valve: The pulmonic valve was normal in structure. Pulmonic valve regurgitation is trivial. Aorta: The aortic root and ascending aorta are structurally normal, with no evidence of dilitation. IAS/Shunts: No atrial level shunt detected by color flow Doppler.  LEFT VENTRICLE PLAX 2D LVIDd:         5.08 cm     Diastology LVIDs:  4.31 cm     LV e' lateral:   9.25 cm/s LV PW:         1.04 cm     LV E/e' lateral: 9.9 LV IVS:        1.23 cm     LV e' medial:    7.40 cm/s LVOT diam:     1.80 cm     LV E/e' medial:  12.4 LV SV:         31 LV SV Index:   17 LVOT Area:     2.54 cm  LV Volumes (MOD) LV vol d, MOD A2C: 90.2 ml LV vol d, MOD A4C: 99.0 ml LV vol s, MOD A2C: 60.1 ml LV vol s, MOD A4C: 61.6 ml LV SV MOD A2C:     30.1 ml LV SV MOD A4C:     99.0 ml LV SV MOD BP:      34.1 ml RIGHT VENTRICLE RV Mid diam:    3.29 cm RV S prime:     8.38 cm/s TAPSE (M-mode): 1.5 cm LEFT ATRIUM             Index       RIGHT ATRIUM           Index LA diam:        4.50 cm 2.54 cm/m  RA Area:     19.30 cm LA Vol (A2C):   77.2 ml 43.50 ml/m RA Volume:   58.50 ml  32.96 ml/m LA Vol (A4C):   77.0 ml 43.38 ml/m LA Biplane Vol: 77.2 ml 43.50 ml/m  AORTIC VALVE                    PULMONIC VALVE AV Area (Vmax):    0.90 cm     PV Vmax:        0.88 m/s AV Area (Vmean):   0.78 cm     PV Peak grad:   3.1 mmHg AV Area (VTI):     1.20 cm     RVOT Peak grad: 1 mmHg AV Vmax:           194.00 cm/s AV Vmean:          139.000 cm/s AV VTI:            0.254 m AV Peak Grad:      15.1 mmHg AV Mean Grad:      9.0 mmHg LVOT Vmax:         68.50 cm/s LVOT Vmean:        42.400 cm/s LVOT VTI:          0.120 m LVOT/AV VTI ratio: 0.47  AORTA Ao Root diam:  2.80 cm MITRAL VALVE               TRICUSPID VALVE MV Area (PHT): 3.89 cm    TR Peak grad:   45.4 mmHg MV Decel Time: 195 msec    TR Vmax:        337.00 cm/s MV E velocity: 91.70 cm/s                            SHUNTS                            Systemic VTI:  0.12 m  Systemic Diam: 1.80 cm Serafina Royals MD Electronically signed by Serafina Royals MD Signature Date/Time: 09/30/2019/8:54:51 AM    Final     Subjective: Patient has no complaints today.  He appears calmer and ready to go to rehab. No nursing concerns overnight.  Discharge Exam: Vitals:   10/06/19 0029 10/06/19 0748  BP: (!) 134/48 113/61  Pulse: 61 73  Resp: 17 16  Temp: 98.2 F (36.8 C) 98.7 F (37.1 C)  SpO2: 100% 99%   Vitals:   10/05/19 1518 10/05/19 1717 10/06/19 0029 10/06/19 0748  BP: 139/85  (!) 134/48 113/61  Pulse: 80 84 61 73  Resp: 16  17 16   Temp: 98.6 F (37 C)  98.2 F (36.8 C) 98.7 F (37.1 C)  TempSrc: Oral  Oral Oral  SpO2:   100% 99%  Weight:      Height:        General: Pt is alert, awake, not in acute distress Cardiovascular: RRR, S1/S2 +, no rubs, no gallops Respiratory: CTA bilaterally, no wheezing, no rhonchi Abdominal: Soft, NT, ND, bowel sounds + Extremities: no edema, no cyanosis   The results of significant diagnostics from this hospitalization (including imaging, microbiology, ancillary and laboratory) are listed below for reference.    Microbiology: Recent Results (from the past 240 hour(s))  SARS Coronavirus 2 by RT PCR (hospital order, performed in [facility] hospital lab) Nasopharyngeal Nasopharyngeal Swab     Status: None   Collection Time: 09/29/19  1:10 PM   Specimen: Nasopharyngeal Swab  Result Value Ref Range Status   SARS Coronavirus 2 NEGATIVE NEGATIVE Final    Comment: (NOTE) SARS-CoV-2 target nucleic acids are NOT DETECTED. The SARS-CoV-2 RNA is generally detectable in upper and lower respiratory specimens during the acute phase of  infection. The lowest concentration of SARS-CoV-2 viral copies this assay can detect is 250 copies / mL. A negative result does not preclude SARS-CoV-2 infection and should not be used as the sole basis for treatment or other patient management decisions.  A negative result may occur with improper specimen collection / handling, submission of specimen other than nasopharyngeal swab, presence of viral mutation(s) within the areas targeted by this assay, and inadequate number of viral copies (<250 copies / mL). A negative result must be combined with clinical observations, patient history, and epidemiological information. Fact Sheet for Patients:   StrictlyIdeas.no Fact Sheet for Healthcare Providers: BankingDealers.co.za This test is not yet approved or cleared  by the [place] FDA and has been authorized for detection and/or diagnosis of SARS-CoV-2 by FDA under an Emergency Use Authorization (EUA).  This EUA will remain in effect (meaning this test can be used) for the duration of the COVID-19 declaration under Section 564(b)(1) of the Act, 21 U.S.C. section 360bbb-3(b)(1), unless the authorization is terminated or revoked sooner. Performed at [facility], [place], [place] [address]   SARS CORONAVIRUS 2 (TAT 6-24 HRS) Nasopharyngeal Nasopharyngeal Swab     Status: None   Collection Time: 10/05/19  5:19 PM   Specimen: Nasopharyngeal Swab  Result Value Ref Range Status   SARS Coronavirus 2 NEGATIVE NEGATIVE Final    Comment: (NOTE) SARS-CoV-2 target nucleic acids are NOT DETECTED. The SARS-CoV-2 RNA is generally detectable in upper and lower respiratory specimens during the acute phase of infection. Negative results do not preclude SARS-CoV-2 infection, do not rule out co-infections with other pathogens, and should not be used as the sole basis for treatment or other patient management decisions.  Negative  results must be combined with clinical observations, patient history, and epidemiological information. The expected result is Negative. Fact Sheet for Patients: SugarRoll.be Fact Sheet for Healthcare Providers: https://www.woods-mathews.com/ This test is not yet approved or cleared by the [place] FDA and  has been authorized for detection and/or diagnosis of SARS-CoV-2 by FDA under an Emergency Use Authorization (EUA). This EUA will remain  in effect (meaning this test can be used) for the duration of the COVID-19 declaration under Section 56 4(b)(1) of the Act, 21 U.S.C. section 360bbb-3(b)(1), unless the authorization is terminated or revoked sooner. Performed at [facility], [place] [address], [place], [place] [address]      Labs: BNP (last 3 results) No results for input(s): BNP in the last 8760 hours. Basic Metabolic Panel: Recent Labs  Lab 09/30/19 0500 09/30/19 0500 10/01/19 0515 10/02/19 0618 10/03/19 0559 10/04/19 0658 10/05/19 0601  NA 143   < > 145 144 143 143 143  K 2.7*   < > 2.9* 3.1* 3.0* 3.5 3.2*  CL 109   < > 113* 110 111 109 113*  CO2 25   < > 26 23 24 25 24   GLUCOSE 136*   < > 86 108* 114* 81 82  BUN 14   < > 17 23 26* 24* 19  CREATININE 1.46*   < > 1.48* 1.66* 1.51* 1.57* 1.63*  CALCIUM 9.1   < > 8.5* 8.9 9.0 9.1 8.8*  MG 1.8  --  1.9  --  2.1  --  2.0   < > = values in this interval not displayed.   Liver Function Tests: No results for input(s): AST, ALT, ALKPHOS, BILITOT, PROT, ALBUMIN in the last 168 hours. No results for input(s): LIPASE, AMYLASE in the last 168 hours. No results for input(s): AMMONIA in the last 168 hours. CBC: Recent Labs  Lab 09/30/19 0500 10/03/19 0559  WBC 8.1 7.7  HGB 10.6* 10.5*  HCT 31.6* 31.6*  MCV 93.2 93.2  PLT 209 241   Cardiac Enzymes: No results for input(s): CKTOTAL, CKMB, CKMBINDEX, TROPONINI in the last 168 hours. BNP: Invalid input(s):  POCBNP CBG: Recent Labs  Lab 10/05/19 1644 10/05/19 2017 10/06/19 0027 10/06/19 0428 10/06/19 0747  GLUCAP 130* 144* 99 84 75   D-Dimer No results for input(s): DDIMER in the last 72 hours. Hgb A1c No results for input(s): HGBA1C in the last 72 hours. Lipid Profile No results for input(s): CHOL, HDL, LDLCALC, TRIG, CHOLHDL, LDLDIRECT in the last 72 hours. Thyroid function studies No results for input(s): TSH, T4TOTAL, T3FREE, THYROIDAB in the last 72 hours.  Invalid input(s): FREET3 Anemia work up No results for input(s): VITAMINB12, FOLATE, FERRITIN, TIBC, IRON, RETICCTPCT in the last 72 hours. Urinalysis    Component Value Date/Time   COLORURINE STRAW (A) 09/29/2019 0835   APPEARANCEUR CLEAR (A) 09/29/2019 0835   APPEARANCEUR Hazy 01/09/2013 1604   LABSPEC 1.008 09/29/2019 0835   LABSPEC 1.014 01/09/2013 1604   PHURINE 6.0 09/29/2019 0835   GLUCOSEU NEGATIVE 09/29/2019 0835   GLUCOSEU 50 mg/dL 01/09/2013 1604   HGBUR SMALL (A) 09/29/2019 0835   BILIRUBINUR NEGATIVE 09/29/2019 0835   BILIRUBINUR neg 03/25/2019 1056   BILIRUBINUR Negative 01/09/2013 Oil Trough 09/29/2019 0835   PROTEINUR 100 (A) 09/29/2019 0835   UROBILINOGEN 1.0 03/25/2019 1056   NITRITE NEGATIVE 09/29/2019 0835   LEUKOCYTESUR NEGATIVE 09/29/2019 0835   LEUKOCYTESUR Negative 01/09/2013 1604   Sepsis Labs Invalid input(s): PROCALCITONIN,  WBC,  [place] Microbiology Recent Results (from the past 240 hour(s))  SARS Coronavirus 2 by RT PCR (hospital order, performed in [facility] hospital lab) Nasopharyngeal Nasopharyngeal Swab     Status: None   Collection Time: 09/29/19  1:10 PM   Specimen: Nasopharyngeal Swab  Result Value Ref Range Status   SARS Coronavirus 2 NEGATIVE NEGATIVE Final    Comment: (NOTE) SARS-CoV-2 target nucleic acids are NOT DETECTED. The SARS-CoV-2 RNA is generally detectable in upper and lower respiratory specimens during the acute phase of infection. The  lowest concentration of SARS-CoV-2 viral copies this assay can detect is 250 copies / mL. A negative result does not preclude SARS-CoV-2 infection and should not be used as the sole basis for treatment or other patient management decisions.  A negative result may occur with improper specimen collection / handling, submission of specimen other than nasopharyngeal swab, presence of viral mutation(s) within the areas targeted by this assay, and inadequate number of viral copies (<250 copies / mL). A negative result must be combined with clinical observations, patient history, and epidemiological information. Fact Sheet for Patients:   StrictlyIdeas.no Fact Sheet for Healthcare Providers: BankingDealers.co.za This test is not yet approved or cleared  by the [place] FDA and has been authorized for detection and/or diagnosis of SARS-CoV-2 by FDA under an Emergency Use Authorization (EUA).  This EUA will remain in effect (meaning this test can be used) for the duration of the COVID-19 declaration under Section 564(b)(1) of the Act, 21 U.S.C. section 360bbb-3(b)(1), unless the authorization is terminated or revoked sooner. Performed at [facility], [place], [place] [address]   SARS CORONAVIRUS 2 (TAT 6-24 HRS) Nasopharyngeal Nasopharyngeal Swab     Status: None   Collection Time: 10/05/19  5:19 PM   Specimen: Nasopharyngeal Swab  Result Value Ref Range Status   SARS Coronavirus 2 NEGATIVE NEGATIVE Final    Comment: (NOTE) SARS-CoV-2 target nucleic acids are NOT DETECTED. The SARS-CoV-2 RNA is generally detectable in upper and lower respiratory specimens during the acute phase of infection. Negative results do not preclude SARS-CoV-2 infection, do not rule out co-infections with other pathogens, and should not be used as the sole basis for treatment or other patient management decisions. Negative results must be  combined with clinical observations, patient history, and epidemiological information. The expected result is Negative. Fact Sheet for Patients: SugarRoll.be Fact Sheet for Healthcare Providers: https://www.woods-mathews.com/ This test is not yet approved or cleared by the [place] FDA and  has been authorized for detection and/or diagnosis of SARS-CoV-2 by FDA under an Emergency Use Authorization (EUA). This EUA will remain  in effect (meaning this test can be used) for the duration of the COVID-19 declaration under Section 56 4(b)(1) of the Act, 21 U.S.C. section 360bbb-3(b)(1), unless the authorization is terminated or revoked sooner. Performed at [facility], [place] [address] [place], [place] [address]     Time coordinating discharge: Over 30 minutes  SIGNED:  Lorella Nimrod, MD  [facility] 10/06/2019, 10:14 AM  If 7PM-7AM, please contact night-coverage [email or web address]  This record has been created using Systems analyst. Errors have been sought and corrected,but may not always be located. Such creation errors do not reflect on the standard of care.

## 2019-10-05 LAB — BASIC METABOLIC PANEL
Anion gap: 6 (ref 5–15)
BUN: 19 mg/dL (ref 8–23)
CO2: 24 mmol/L (ref 22–32)
Calcium: 8.8 mg/dL — ABNORMAL LOW (ref 8.9–10.3)
Chloride: 113 mmol/L — ABNORMAL HIGH (ref 98–111)
Creatinine, Ser: 1.63 mg/dL — ABNORMAL HIGH (ref 0.61–1.24)
GFR calc Af Amer: 45 mL/min — ABNORMAL LOW (ref >60)
GFR calc non Af Amer: 39 mL/min — ABNORMAL LOW (ref >60)
Glucose, Bld: 82 mg/dL (ref 70–99)
Potassium: 3.2 mmol/L — ABNORMAL LOW (ref 3.5–5.1)
Sodium: 143 mmol/L (ref 135–145)

## 2019-10-05 LAB — GLUCOSE, CAPILLARY
Glucose-Capillary: 115 mg/dL — ABNORMAL HIGH (ref 70–99)
Glucose-Capillary: 130 mg/dL — ABNORMAL HIGH (ref 70–99)
Glucose-Capillary: 144 mg/dL — ABNORMAL HIGH (ref 70–99)
Glucose-Capillary: 170 mg/dL — ABNORMAL HIGH (ref 70–99)
Glucose-Capillary: 77 mg/dL (ref 70–99)
Glucose-Capillary: 81 mg/dL (ref 70–99)

## 2019-10-05 LAB — SARS CORONAVIRUS 2 (TAT 6-24 HRS): SARS Coronavirus 2: NEGATIVE

## 2019-10-05 LAB — MAGNESIUM: Magnesium: 2 mg/dL (ref 1.7–2.4)

## 2019-10-05 MED ORDER — POTASSIUM CHLORIDE CRYS ER 20 MEQ PO TBCR
40.0000 meq | EXTENDED_RELEASE_TABLET | Freq: Once | ORAL | Status: AC
  Administered 2019-10-05: 10:00:00 40 meq via ORAL
  Filled 2019-10-05: qty 2

## 2019-10-05 MED ORDER — POLYETHYLENE GLYCOL 3350 17 G PO PACK
17.0000 g | PACK | Freq: Every day | ORAL | Status: DC
  Administered 2019-10-05 – 2019-10-06 (×2): 17 g via ORAL
  Filled 2019-10-05 (×2): qty 1

## 2019-10-05 NOTE — Progress Notes (Signed)
PROGRESS NOTE    Justin Manning  HKV:[id number] DOB: August 20, 1935 DOA: 09/29/2019 PCP: Steele Sizer, MD   Brief Narrative:  Justin Manning a 84 y.o.malewith medical history significant ofchronic kidney disease, dementia, diabetes mellitus, hypertension, atrial fibrillation on Pradaxa had left his house earlier this morning morning around and a neighbor sawhim and called the daughter. Apparently patient had fallen. Pt does not remember anything secondary to advanced dementia.  Subjective: Patient has no new complaints.  He was eating his breakfast comfortably.  No overnight nursing concern.  Assessment & Plan:   Active Problems:   Fall   Hypokalemia   Elevated troponin  Recurrent falls.  Patient had a fall in hospital few days ago, CT head was negative. PT/OT recommending SNF placement. -POC working on bed search and insurance authorization, patient has a bed at [facility] who refused to take him yesterday as he was requiring a Actuary at night prior.  Patient is much calmer for more than 24 hours now, they refused to take him today also stating that they will take him on Monday if he remains calmer over the weekend. Patient is very calm for the past more than 2 days now.  Atrial fibrillation.  Currently rate controlled with Cardizem. -Cardizem and Pradaxa.  HFrEF.  New diagnosis, EF of 25 to 30%. Cardiology was consulted-they do not recommend further evaluation and will continue with current management.  Presently appears euvolemic. Might need low-dose antidiuretics as an outpatient. -Continue with lisinopril, statin and Imdur along with diltiazem.  CKD stage IIIa.  Creatinine seems stable. -Nephrology is following-appreciate their recommendations.  Hypokalemia.  Potassium at 3.2, magnesium within normal limit. -Continue to replete as needed. -HCTZ was discontinued by nephrology due to concern of persistent hypokalemia.  Hypertension.  Pressure mildly  elevated. -Continue hydralazine and lisinopril. -Hydralazine dose was increased to 100 mg 3 times daily during current hospitalization.  Dementia/delirium.  Delirium associated with advanced dementia is most likely responsible for his sundowning effect and agitation.  Much more calmer now for the past 2 days. -Continue memantine. -Remeron was held due to concern of worsening confusion per daughter. -Continue Seroquel at bedtime. -Continue Ativan as needed. -We will try to avoid sitter.  Type 2 diabetes. -Continue SSI  Objective: Vitals:   10/04/19 0851 10/04/19 1757 10/04/19 2321 10/05/19 0744  BP: (!) 155/65 (!) 160/61 (!) 151/119 (!) 147/91  Pulse: 78 92 78 78  Resp: 17 16 16    Temp: 98.3 F (36.8 C) 98.3 F (36.8 C) (!) 97.5 F (36.4 C) 98.5 F (36.9 C)  TempSrc: Oral  Oral Oral  SpO2: 95% 100% 99% 100%  Weight:      Height:        Intake/Output Summary (Last 24 hours) at 10/05/2019 1139 Last data filed at 10/04/2019 1700 Gross per 24 hour  Intake 120 ml  Output --  Net 120 ml   Filed Weights   09/29/19 0834  Weight: 65.1 kg    Examination:  General exam: Appears calm and comfortable. Respiratory system: Clear to auscultation. Respiratory effort normal. Cardiovascular system: S1 & S2 heard, RRR. No JVD, murmurs, rubs, gallops Gastrointestinal system: Soft, nontender, nondistended, bowel sounds positive. Central nervous system: Alert and oriented. No focal neurological deficits. Extremities: No edema, no cyanosis, pulses intact and symmetrical. Psychiatry: Judgement and insight appear normal.   DVT prophylaxis: Pradaxa Code Status: Full Family Communication: Daughter was updated on phone. Disposition Plan:  Status is: Inpatient  Remains inpatient appropriate because:Inpatient level of  care appropriate due to severity of illness   Dispo: The patient is from: Home              Anticipated d/c is to: SNF              Anticipated d/c date is: 1 days.               Patient currently is medically stable to d/c.  PT is recommending SNF placement.  Patient has a bed at [facility] take him yesterday stating that he was agitated overnight.  Contacted them again this morning as there was no nursing concern or agitation over the past 24-hour, they will take him on Monday if he remains calmer over the weekend.  Consultants:   Cardiology-signed off  Nephrology  Procedures:  Antimicrobials:   Data Reviewed: I have personally reviewed following labs and imaging studies  CBC: Recent Labs  Lab 09/29/19 0835 09/30/19 0500 10/03/19 0559  WBC 10.2 8.1 7.7  HGB 10.6* 10.6* 10.5*  HCT 31.7* 31.6* 31.6*  MCV 91.9 93.2 93.2  PLT 207 209 245   Basic Metabolic Panel: Recent Labs  Lab 09/30/19 0500 09/30/19 0500 10/01/19 0515 10/02/19 0618 10/03/19 0559 10/04/19 0658 10/05/19 0601  NA 143   < > 145 144 143 143 143  K 2.7*   < > 2.9* 3.1* 3.0* 3.5 3.2*  CL 109   < > 113* 110 111 109 113*  CO2 25   < > 26 23 24 25 24   GLUCOSE 136*   < > 86 108* 114* 81 82  BUN 14   < > 17 23 26* 24* 19  CREATININE 1.46*   < > 1.48* 1.66* 1.51* 1.57* 1.63*  CALCIUM 9.1   < > 8.5* 8.9 9.0 9.1 8.8*  MG 1.8  --  1.9  --  2.1  --  2.0   < > = values in this interval not displayed.   GFR: Estimated Creatinine Clearance: 32.2 mL/min (A) (by C-G formula based on SCr of 1.63 mg/dL (H)). Liver Function Tests: Recent Labs  Lab 09/29/19 0835  AST 26  ALT 17  ALKPHOS 56  BILITOT 0.6  PROT 6.9  ALBUMIN 3.6   No results for input(s): LIPASE, AMYLASE in the last 168 hours. No results for input(s): AMMONIA in the last 168 hours. Coagulation Profile: No results for input(s): INR, PROTIME in the last 168 hours. Cardiac Enzymes: No results for input(s): CKTOTAL, CKMB, CKMBINDEX, TROPONINI in the last 168 hours. BNP (last 3 results) No results for input(s): PROBNP in the last 8760 hours. HbA1C: No results for input(s): HGBA1C in the last 72  hours. CBG: Recent Labs  Lab 10/04/19 1708 10/04/19 2019 10/05/19 0004 10/05/19 0400 10/05/19 0747  GLUCAP 147* 173* 115* 81 77   Lipid Profile: No results for input(s): CHOL, HDL, LDLCALC, TRIG, CHOLHDL, LDLDIRECT in the last 72 hours. Thyroid Function Tests: No results for input(s): TSH, T4TOTAL, FREET4, T3FREE, THYROIDAB in the last 72 hours. Anemia Panel: No results for input(s): VITAMINB12, FOLATE, FERRITIN, TIBC, IRON, RETICCTPCT in the last 72 hours. Sepsis Labs: No results for input(s): PROCALCITON, LATICACIDVEN in the last 168 hours.  Recent Results (from the past 240 hour(s))  SARS Coronavirus 2 by RT PCR (hospital order, performed in [facility] hospital lab) Nasopharyngeal Nasopharyngeal Swab     Status: None   Collection Time: 09/29/19  1:10 PM   Specimen: Nasopharyngeal Swab  Result Value Ref Range Status   SARS Coronavirus  2 NEGATIVE NEGATIVE Final    Comment: (NOTE) SARS-CoV-2 target nucleic acids are NOT DETECTED. The SARS-CoV-2 RNA is generally detectable in upper and lower respiratory specimens during the acute phase of infection. The lowest concentration of SARS-CoV-2 viral copies this assay can detect is 250 copies / mL. A negative result does not preclude SARS-CoV-2 infection and should not be used as the sole basis for treatment or other patient management decisions.  A negative result may occur with improper specimen collection / handling, submission of specimen other than nasopharyngeal swab, presence of viral mutation(s) within the areas targeted by this assay, and inadequate number of viral copies (<250 copies / mL). A negative result must be combined with clinical observations, patient history, and epidemiological information. Fact Sheet for Patients:   StrictlyIdeas.no Fact Sheet for Healthcare Providers: BankingDealers.co.za This test is not yet approved or cleared  by the [place] FDA and has  been authorized for detection and/or diagnosis of SARS-CoV-2 by FDA under an Emergency Use Authorization (EUA).  This EUA will remain in effect (meaning this test can be used) for the duration of the COVID-19 declaration under Section 564(b)(1) of the Act, 21 U.S.C. section 360bbb-3(b)(1), unless the authorization is terminated or revoked sooner. Performed at [facility], [address] [place], [place] [address]      Radiology Studies: No results found.  Scheduled Meds: . carvedilol  6.25 mg Oral BID WC  . cholecalciferol  2,000 Units Oral BID  . dabigatran  75 mg Oral BID  . diltiazem  240 mg Oral Daily  . hydrALAZINE  100 mg Oral TID  . insulin aspart  0-9 Units Subcutaneous Q4H  . insulin glargine  6 Units Subcutaneous Q2000  . isosorbide mononitrate  30 mg Oral Daily  . lisinopril  20 mg Oral Daily  . memantine  5 mg Oral BID  . multivitamin with minerals  1 tablet Oral Daily  . polyethylene glycol  17 g Oral Daily  . potassium chloride  20 mEq Oral Daily  . QUEtiapine  25 mg Oral QHS  . rosuvastatin  40 mg Oral QHS  . vitamin B-12  100 mcg Oral Daily   Continuous Infusions:   LOS: 6 days   Time spent: 35 minutes.  Lorella Nimrod, MD [facility]  If 7PM-7AM, please contact night-coverage [email or web address]  10/05/2019, 11:39 AM   This record has been created using Systems analyst. Errors have been sought and corrected,but may not always be located. Such creation errors do not reflect on the standard of care.

## 2019-10-06 LAB — GLUCOSE, CAPILLARY
Glucose-Capillary: 106 mg/dL — ABNORMAL HIGH (ref 70–99)
Glucose-Capillary: 75 mg/dL (ref 70–99)
Glucose-Capillary: 84 mg/dL (ref 70–99)
Glucose-Capillary: 99 mg/dL (ref 70–99)

## 2019-10-06 MED ORDER — HYDRALAZINE HCL 100 MG PO TABS: 100.0000 mg | ORAL_TABLET | Freq: Three times a day (TID) | ORAL | Status: DC

## 2019-10-06 MED ORDER — QUETIAPINE FUMARATE 25 MG PO TABS: 25.0000 mg | ORAL_TABLET | Freq: Every day | ORAL | Status: DC

## 2019-10-06 MED ORDER — ISOSORBIDE MONONITRATE ER 30 MG PO TB24: 30.0000 mg | ORAL_TABLET | Freq: Every day | ORAL | Status: DC

## 2019-10-06 MED ORDER — ADULT MULTIVITAMIN W/MINERALS CH: 1.0000 | ORAL_TABLET | Freq: Every day | ORAL | Status: DC

## 2019-10-06 MED ORDER — ONDANSETRON HCL 4 MG PO TABS: 4.0000 mg | ORAL_TABLET | Freq: Four times a day (QID) | ORAL | 0 refills | Status: DC | PRN

## 2019-10-06 MED ORDER — BISACODYL 5 MG PO TBEC: 5.0000 mg | DELAYED_RELEASE_TABLET | Freq: Every day | ORAL | 0 refills | Status: DC | PRN

## 2019-10-06 MED ORDER — CARVEDILOL 6.25 MG PO TABS: 6.2500 mg | ORAL_TABLET | Freq: Two times a day (BID) | ORAL | Status: DC

## 2019-10-06 MED ORDER — POLYETHYLENE GLYCOL 3350 17 G PO PACK: 17.0000 g | PACK | Freq: Every day | ORAL | 0 refills | Status: DC | PRN

## 2019-10-06 NOTE — TOC Progression Note (Signed)
Transition of Care [facility]) - Progression Note    Patient Details  Name: Justin Manning MRN: [id number] Date of Birth: 01/26/1936  [facility] CM/SW Contact  Shelbie Hutching, RN Phone Number: 10/06/2019, 11:16 AM  Clinical Narrative:    EMS transport has been arranged, patient is 2nd on the list for pick up.    Expected Discharge Plan: [place] Barriers to Discharge: Barriers Resolved  Expected Discharge Plan and Services Expected Discharge Plan: [place]   Discharge Planning Services: CM Consult Post Acute Care Choice: [place] Living arrangements for the past 2 months: Apartment Expected Discharge Date: 10/04/19                                     Social Determinants of Health (SDOH) Interventions    Readmission Risk Interventions No flowsheet data found.

## 2019-10-06 NOTE — Progress Notes (Signed)
Please be advised that the above-named patient will require a short-term nursing home stay-anticipated 30 days or less for rehabilitation and strengthening. The plan is for return home.  

## 2019-10-06 NOTE — Care Management Important Message (Signed)
Important Message  Patient Details  Name: Justin Manning MRN: [id number] Date of Birth: 20-Feb-1936   Medicare Important Message Given:  Yes     [name] 10/06/2019, 11:48 AM

## 2019-10-06 NOTE — TOC Transition Note (Signed)
Transition of Care [facility]) - CM/SW Discharge Note   Patient Details  Name: Justin Manning MRN: [id number] Date of Birth: [birth year]  [facility] Care [facility] CM/SW Contact:  Shelbie Hutching, RN Phone Number: 10/06/2019, 10:36 AM   Clinical Narrative:    Patient is medically stable for discharge to [facility] today.  Patient will be going to room 205A, bedside RN will call report to [phone].  This RNCM will arrange [facility] for transport to facility.  Patient's daughter, [name] is aware of discharge today.    Final next level of care: Skilled Nursing Facility Barriers to Discharge: Barriers Resolved   Patient Goals and CMS Choice   CMS Medicare.gov Compare Post Acute Care list provided to:: Patient Represenative (must comment) Choice offered to / list presented to : Patient, Adult Children  Discharge Placement              Patient chooses bed at: [facility] Patient to be transferred to facility by: [facility] Name of family member notified: Maurio Baize- daughter Patient and family notified of of transfer: 10/06/19  Discharge Plan and Services   Discharge Planning Services: CM Consult Post Acute Care Choice: [place]                               Social Determinants of Health (SDOH) Interventions     Readmission Risk Interventions No flowsheet data found.

## 2019-10-06 NOTE — Progress Notes (Signed)
Called report to alna at [facility], waiting for transport

## 2019-10-14 DIAGNOSIS — E876 Hypokalemia: Secondary | ICD-10-CM | POA: Diagnosis not present

## 2019-10-14 DIAGNOSIS — N1832 Chronic kidney disease, stage 3b: Secondary | ICD-10-CM | POA: Diagnosis not present

## 2019-10-14 DIAGNOSIS — N2581 Secondary hyperparathyroidism of renal origin: Secondary | ICD-10-CM | POA: Diagnosis not present

## 2019-10-14 DIAGNOSIS — I129 Hypertensive chronic kidney disease with stage 1 through stage 4 chronic kidney disease, or unspecified chronic kidney disease: Secondary | ICD-10-CM | POA: Diagnosis not present

## 2019-10-14 DIAGNOSIS — D631 Anemia in chronic kidney disease: Secondary | ICD-10-CM | POA: Diagnosis not present

## 2019-10-14 DIAGNOSIS — R809 Proteinuria, unspecified: Secondary | ICD-10-CM | POA: Diagnosis not present

## 2019-10-22 DIAGNOSIS — R41841 Cognitive communication deficit: Secondary | ICD-10-CM | POA: Diagnosis not present

## 2019-10-22 DIAGNOSIS — R262 Difficulty in walking, not elsewhere classified: Secondary | ICD-10-CM | POA: Diagnosis not present

## 2019-10-22 DIAGNOSIS — M6281 Muscle weakness (generalized): Secondary | ICD-10-CM | POA: Diagnosis not present

## 2019-10-27 DIAGNOSIS — J9811 Atelectasis: Secondary | ICD-10-CM | POA: Diagnosis not present

## 2019-10-27 DIAGNOSIS — I509 Heart failure, unspecified: Secondary | ICD-10-CM | POA: Diagnosis not present

## 2019-10-27 DIAGNOSIS — I517 Cardiomegaly: Secondary | ICD-10-CM | POA: Diagnosis not present

## 2019-10-27 DIAGNOSIS — J90 Pleural effusion, not elsewhere classified: Secondary | ICD-10-CM | POA: Diagnosis not present

## 2019-11-09 ENCOUNTER — Encounter: Payer: Self-pay | Admitting: Emergency Medicine

## 2019-11-09 ENCOUNTER — Emergency Department: Payer: Medicare Other

## 2019-11-09 ENCOUNTER — Emergency Department
Admission: EM | Admit: 2019-11-09 | Discharge: 2019-11-10 | Disposition: A | Discharge status: Home / Self Care | Payer: Medicare Other | Attending: Emergency Medicine | Admitting: Emergency Medicine

## 2019-11-09 DIAGNOSIS — Z8546 Personal history of malignant neoplasm of prostate: Secondary | ICD-10-CM | POA: Insufficient documentation

## 2019-11-09 DIAGNOSIS — E1122 Type 2 diabetes mellitus with diabetic chronic kidney disease: Secondary | ICD-10-CM | POA: Insufficient documentation

## 2019-11-09 DIAGNOSIS — R069 Unspecified abnormalities of breathing: Secondary | ICD-10-CM | POA: Diagnosis not present

## 2019-11-09 DIAGNOSIS — Z794 Long term (current) use of insulin: Secondary | ICD-10-CM | POA: Diagnosis not present

## 2019-11-09 DIAGNOSIS — I130 Hypertensive heart and chronic kidney disease with heart failure and stage 1 through stage 4 chronic kidney disease, or unspecified chronic kidney disease: Secondary | ICD-10-CM | POA: Insufficient documentation

## 2019-11-09 DIAGNOSIS — Z79899 Other long term (current) drug therapy: Secondary | ICD-10-CM | POA: Diagnosis not present

## 2019-11-09 DIAGNOSIS — Z85038 Personal history of other malignant neoplasm of large intestine: Secondary | ICD-10-CM | POA: Insufficient documentation

## 2019-11-09 DIAGNOSIS — Z7401 Bed confinement status: Secondary | ICD-10-CM | POA: Diagnosis not present

## 2019-11-09 DIAGNOSIS — R062 Wheezing: Secondary | ICD-10-CM | POA: Diagnosis not present

## 2019-11-09 DIAGNOSIS — Z87891 Personal history of nicotine dependence: Secondary | ICD-10-CM | POA: Diagnosis not present

## 2019-11-09 DIAGNOSIS — I5033 Acute on chronic diastolic (congestive) heart failure: Secondary | ICD-10-CM | POA: Diagnosis not present

## 2019-11-09 DIAGNOSIS — R0689 Other abnormalities of breathing: Secondary | ICD-10-CM | POA: Diagnosis not present

## 2019-11-09 DIAGNOSIS — R531 Weakness: Secondary | ICD-10-CM | POA: Diagnosis not present

## 2019-11-09 DIAGNOSIS — R404 Transient alteration of awareness: Secondary | ICD-10-CM | POA: Diagnosis not present

## 2019-11-09 DIAGNOSIS — M2550 Pain in unspecified joint: Secondary | ICD-10-CM | POA: Diagnosis not present

## 2019-11-09 DIAGNOSIS — N183 Chronic kidney disease, stage 3 unspecified: Secondary | ICD-10-CM | POA: Insufficient documentation

## 2019-11-09 DIAGNOSIS — I10 Essential (primary) hypertension: Secondary | ICD-10-CM | POA: Diagnosis not present

## 2019-11-09 LAB — COMPREHENSIVE METABOLIC PANEL
ALT: 19 U/L (ref 0–44)
AST: 26 U/L (ref 15–41)
Albumin: 3.3 g/dL — ABNORMAL LOW (ref 3.5–5.0)
Alkaline Phosphatase: 54 U/L (ref 38–126)
Anion gap: 8 (ref 5–15)
BUN: 35 mg/dL — ABNORMAL HIGH (ref 8–23)
CO2: 22 mmol/L (ref 22–32)
Calcium: 9 mg/dL (ref 8.9–10.3)
Chloride: 113 mmol/L — ABNORMAL HIGH (ref 98–111)
Creatinine, Ser: 1.78 mg/dL — ABNORMAL HIGH (ref 0.61–1.24)
GFR calc Af Amer: 40 mL/min — ABNORMAL LOW (ref >60)
GFR calc non Af Amer: 35 mL/min — ABNORMAL LOW (ref >60)
Glucose, Bld: 125 mg/dL — ABNORMAL HIGH (ref 70–99)
Potassium: 3.8 mmol/L (ref 3.5–5.1)
Sodium: 143 mmol/L (ref 135–145)
Total Bilirubin: 0.8 mg/dL (ref 0.3–1.2)
Total Protein: 6.5 g/dL (ref 6.5–8.1)

## 2019-11-09 LAB — CBC WITH DIFFERENTIAL/PLATELET
Abs Immature Granulocytes: 0.02 10*3/uL (ref 0.00–0.07)
Basophils Absolute: 0.1 10*3/uL (ref 0.0–0.1)
Basophils Relative: 1 %
Eosinophils Absolute: 0.2 10*3/uL (ref 0.0–0.5)
Eosinophils Relative: 3 %
HCT: 31.4 % — ABNORMAL LOW (ref 39.0–52.0)
Hemoglobin: 10.2 g/dL — ABNORMAL LOW (ref 13.0–17.0)
Immature Granulocytes: 0 %
Lymphocytes Relative: 12 %
Lymphs Abs: 1 10*3/uL (ref 0.7–4.0)
MCH: 30.8 pg (ref 26.0–34.0)
MCHC: 32.5 g/dL (ref 30.0–36.0)
MCV: 94.9 fL (ref 80.0–100.0)
Monocytes Absolute: 0.7 10*3/uL (ref 0.1–1.0)
Monocytes Relative: 9 %
Neutro Abs: 6.5 10*3/uL (ref 1.7–7.7)
Neutrophils Relative %: 75 %
Platelets: 258 10*3/uL (ref 150–400)
RBC: 3.31 MIL/uL — ABNORMAL LOW (ref 4.22–5.81)
RDW: 16.4 % — ABNORMAL HIGH (ref 11.5–15.5)
WBC: 8.5 10*3/uL (ref 4.0–10.5)
nRBC: 0 % (ref 0.0–0.2)

## 2019-11-09 LAB — URINALYSIS, COMPLETE (UACMP) WITH MICROSCOPIC
Bacteria, UA: NONE SEEN
Bilirubin Urine: NEGATIVE
Glucose, UA: NEGATIVE mg/dL
Hgb urine dipstick: NEGATIVE
Ketones, ur: NEGATIVE mg/dL
Leukocytes,Ua: NEGATIVE
Nitrite: NEGATIVE
Protein, ur: 100 mg/dL — AB
Specific Gravity, Urine: 1.021 (ref 1.005–1.030)
Squamous Epithelial / HPF: NONE SEEN (ref 0–5)
pH: 5 (ref 5.0–8.0)

## 2019-11-09 LAB — TROPONIN I (HIGH SENSITIVITY)
Troponin I (High Sensitivity): 43 ng/L — ABNORMAL HIGH (ref <18)
Troponin I (High Sensitivity): 41 ng/L — ABNORMAL HIGH (ref <18)

## 2019-11-09 LAB — BRAIN NATRIURETIC PEPTIDE: B Natriuretic Peptide: 1434.9 pg/mL — ABNORMAL HIGH (ref 0.0–100.0)

## 2019-11-09 MED ORDER — FUROSEMIDE 10 MG/ML IJ SOLN
40.0000 mg | Freq: Once | INTRAMUSCULAR | Status: AC
  Administered 2019-11-09: 40 mg via INTRAVENOUS
  Filled 2019-11-09: qty 4

## 2019-11-09 MED ORDER — FUROSEMIDE 20 MG PO TABS
20.0000 mg | ORAL_TABLET | Freq: Every day | ORAL | 0 refills | Status: DC
Start: 2019-11-09 — End: 2019-12-13

## 2019-11-09 NOTE — ED Triage Notes (Signed)
Pt to ED from [facility] with increased weakness that was noted this morning by staff as well as wheezing x1 day.

## 2019-11-09 NOTE — ED Notes (Signed)
Discharge instructions reviewed with daughter. Daughter called [facility] to notify pt was enroute to come back and has a Sports coach.

## 2019-11-09 NOTE — ED Notes (Signed)
Pt given a urinal and encouraged to provide sample when he can. Call bell within reach. Pt denies any needs at this time.

## 2019-11-09 NOTE — ED Provider Notes (Signed)
-----------------------------------------   11:36 PM on 11/09/2019 -----------------------------------------  84 year old male with past medical history of dementia, atrial fibrillation, diabetes, and CHF (EF 25 to 30%) presents to the ED with increased difficulty breathing noticed by his daughter when she went to visit him at [facility] facility.  On arrival patient is not in any respiratory distress, maintaining O2 sats on room air but states he is feeling a little short of breath.  Chest x-ray shows mild pulmonary vascular congestion and BNP is elevated, consistent with CHF exacerbation.  He does not currently take any diuretic, was given 40 mg of IV Lasix here in the ED with diuresis of greater than 700 cc of urine.  He now states that shortness of breath has resolved, denies any chest pain.  EKG and troponin are unremarkable, no evidence of ACS.  He is appropriate for discharge home, will provide referral to heart failure clinic and start on 20 mg of Lasix daily.  Daughter was counseled to have him return to the ED for any new or worsening symptoms.   Blake Divine, MD 11/09/19 [phone]

## 2019-11-09 NOTE — ED Provider Notes (Signed)
[facility] Department Provider Note  ____________________________________________   First MD Initiated Contact with Patient 11/09/19 1829     (approximate)  I have reviewed the triage vital signs and the nursing notes.   HISTORY  Chief Complaint Weakness   HPI Justin Manning is a 84 y.o. male with significant past medical history of atrial fibrillation, diabetes, colon and prostate cancer, and emphysema who presents to the emergency department for increased weakness that was noticed this morning as well as shortness of breath above baseline with wheezing.  Wheezing started yesterday.   Per patient's daughter, cognition is at baseline secondary to his dementia.  Past Medical History:  Diagnosis Date  . Anemia   . CKD (chronic kidney disease), stage III    Dr. Juleen China  . Dementia ([place])   . Diabetes mellitus without complication ([place])   . GERD (gastroesophageal reflux disease)   . Hyperlipidemia   . Hypertension   . Mitral regurgitation    Mild    Patient Active Problem List   Diagnosis Date Noted  . Hypokalemia   . Elevated troponin   . Fall 09/29/2019  . History of alcoholism ([place]) 01/05/2017  . Hiatal hernia 04/11/2015  . Emphysema lung ([place]) 04/11/2015  . Protein malnutrition ([place]) 03/18/2015  . Anemia in chronic illness 11/09/2014  . Abdominal aortic atherosclerosis ([place]) 11/09/2014  . A-fib ([place]) 11/09/2014  . Atrial hypertrophy 11/09/2014  . Carotid arterial disease ([place]) 11/09/2014  . Chronic kidney disease (CKD), stage III (moderate) 11/09/2014  . Diabetes mellitus with renal manifestations, uncontrolled ([place]) 11/09/2014  . Diastolic dysfunction with heart failure ([place]) 11/09/2014  . Dysfunction of eustachian tube 11/09/2014  . Dyslipidemia associated with type 2 diabetes mellitus ([place]) 11/09/2014  . Essential (primary) hypertension 11/09/2014  . Gastro-esophageal reflux disease without esophagitis 11/09/2014  .  Hearing loss of left ear 11/09/2014  . H/O carotid endarterectomy 11/09/2014  . H/O malignant neoplasm of colon 11/09/2014  . H/O infectious disease 11/09/2014  . H/O malignant neoplasm of prostate 11/09/2014  . Lung nodule, solitary 11/09/2014  . Mild cognitive disorder 11/09/2014  . TI (tricuspid incompetence) 11/09/2014    Past Surgical History:  Procedure Laterality Date  . ENDARTERECTOMY Left    Carotid  . KNEE SURGERY Right   . PARTIAL COLECTOMY     with Anastomosis  . SHOULDER SURGERY Right     Prior to Admission medications   Medication Sig Start Date End Date Taking? Authorizing Provider  carvedilol (COREG) 6.25 MG tablet Take 1 tablet (6.25 mg total) by mouth 2 (two) times daily with a meal. 10/06/19  Yes Lorella Nimrod, MD  Cholecalciferol 50 MCG (2000 UT) CAPS Take 2,000 Units by mouth 2 (two) times daily.    Yes [provider]  Cyanocobalamin (VITAMIN B 12) 100 MCG LOZG Take 100 mcg by mouth daily.    Yes [provider]  dabigatran (PRADAXA) 75 MG CAPS capsule Take 75 mg by mouth 2 (two) times daily.    Yes [provider]  diltiazem (CARTIA XT) 120 MG 24 hr capsule Take 120 mg by mouth daily.  11/08/16  Yes [provider]  isosorbide mononitrate (IMDUR) 30 MG 24 hr tablet Take 1 tablet (30 mg total) by mouth daily. 10/06/19  Yes Lorella Nimrod, MD  LANTUS SOLOSTAR 100 UNIT/ML Solostar Pen INJECT 8 UNITS UNDER THE SKIN DAILY Patient taking differently: Inject 8 Units into the skin at bedtime.  08/26/19  Yes Steele Sizer, MD  lisinopril (  ZESTRIL) 20 MG tablet TAKE 1 TABLET(20 MG) BY MOUTH TWICE DAILY 08/24/19  Yes Sowles, Drue Stager, MD  memantine (NAMENDA) 5 MG tablet Take 1 tablet (5 mg total) by mouth 2 (two) times daily. 09/03/19  Yes Sowles, Drue Stager, MD  Multiple Vitamin (MULTIVITAMIN WITH MINERALS) TABS tablet Take 1 tablet by mouth daily. 10/06/19  Yes Lorella Nimrod, MD  QUEtiapine (SEROQUEL) 25 MG tablet Take 1 tablet (25 mg total) by  mouth at bedtime. 10/06/19  Yes Lorella Nimrod, MD  bisacodyl (DULCOLAX) 5 MG EC tablet Take 1 tablet (5 mg total) by mouth daily as needed for moderate constipation. 10/06/19   Lorella Nimrod, MD  glucose blood (FREESTYLE LITE) test strip TEST BLOOD SUGAR THREE TIMES DAILY AS DIRECTED.. 11/11/18   Steele Sizer, MD  hydrALAZINE (APRESOLINE) 100 MG tablet Take 1 tablet (100 mg total) by mouth 3 (three) times daily. Patient not taking: Reported on 11/09/2019 10/06/19   Lorella Nimrod, MD  Insulin Pen Needle (B-D ULTRAFINE III SHORT PEN) 31G X 8 MM MISC USE DAILY WITH LANTUS 03/04/19   Ancil Boozer, Drue Stager, MD  ondansetron (ZOFRAN) 4 MG tablet Take 1 tablet (4 mg total) by mouth every 6 (six) hours as needed for nausea. 10/06/19   Lorella Nimrod, MD  polyethylene glycol (MIRALAX / GLYCOLAX) 17 g packet Take 17 g by mouth daily as needed for mild constipation or moderate constipation. 10/06/19   Lorella Nimrod, MD  rosuvastatin (CRESTOR) 20 MG tablet Take 20 mg by mouth at bedtime. 10/28/19   [provider]    Allergies Aspirin  Family History  Problem Relation Age of Onset  . Alcohol abuse Brother   . Diabetes Daughter     Social History Social History   Tobacco Use  . Smoking status: Former Smoker    Packs/day: 1.00    Years: 10.00    Pack years: 10.00    Types: Cigarettes    Quit date: 05/15/1988    Years since quitting: 31.5  . Smokeless tobacco: Never Used  . Tobacco comment: smoking cessation materials not required  Vaping Use  . Vaping Use: Never used  Substance Use Topics  . Alcohol use: No    Alcohol/week: 0.0 standard drinks    Comment: used to be a heavy drinker but quit 40 years ago   . Drug use: Not Currently    Types: Marijuana    Comment: many years ago     Review of Systems  Level 5 caveat: Dementia ____________________________________________   PHYSICAL EXAM:  VITAL SIGNS: ED Triage Vitals  Enc Vitals Group     BP 11/09/19 1821 (!) 151/96     Pulse Rate  11/09/19 1821 84     Resp 11/09/19 1821 13     Temp 11/09/19 1821 98.4 F (36.9 C)     Temp Source 11/09/19 1821 Oral     SpO2 11/09/19 1821 98 %     Weight 11/09/19 1823 186 lb 15.2 oz (84.8 kg)     Height 11/09/19 1823 5\' 8"  (1.727 m)     Head Circumference --      Peak Flow --      Pain Score 11/09/19 1823 0     Pain Loc --      Pain Edu? --      Excl. in [place]? --     Constitutional: Alert and oriented.  Overall well appearing and in no acute distress.  Demented mental status. Eyes: Conjunctivae are normal. PERRL. Head: Atraumatic. Nose: No congestion/rhinnorhea.  Mouth/Throat: Mucous membranes are moist.  Oropharynx non-erythematous. Tongue normal in size and color. Neck: No stridor.  No carotid bruit appreciated on exam. Hematological/Lymphatic/Immunilogical: No cervical lymphadenopathy. Cardiovascular: Normal rate, regular rhythm. Grossly normal heart sounds.  Good peripheral circulation. Respiratory: Normal respiratory effort.  No retractions. Lungs CTAB. Gastrointestinal: Soft.  Abdominal hernia present without evidence of incarceration.  Patient denies pain with palpation over the area. Genitourinary: Exam deferred. Musculoskeletal: No lower extremity tenderness.  Pitting edema of bilateral extremities. Neurologic:  Normal speech and language. No gross focal neurologic deficits are appreciated. Skin:  Skin is warm, dry and intact. No rash noted. Psychiatric: Mood and affect are normal. Speech and behavior are normal.  ____________________________________________   LABS (all labs ordered are listed, but only abnormal results are displayed)  Labs Reviewed  COMPREHENSIVE METABOLIC PANEL - Abnormal; Notable for the following components:      Result Value   Chloride 113 (*)    Glucose, Bld 125 (*)    BUN 35 (*)    Creatinine, Ser 1.78 (*)    Albumin 3.3 (*)    GFR calc non Af Amer 35 (*)    GFR calc Af Amer 40 (*)    All other components within normal limits  CBC WITH  DIFFERENTIAL/PLATELET - Abnormal; Notable for the following components:   RBC 3.31 (*)    Hemoglobin 10.2 (*)    HCT 31.4 (*)    RDW 16.4 (*)    All other components within normal limits  BRAIN NATRIURETIC PEPTIDE - Abnormal; Notable for the following components:   B Natriuretic Peptide 1,434.9 (*)    All other components within normal limits  TROPONIN I (HIGH SENSITIVITY) - Abnormal; Notable for the following components:   Troponin I (High Sensitivity) 43 (*)    All other components within normal limits  URINALYSIS, COMPLETE (UACMP) WITH MICROSCOPIC  TROPONIN I (HIGH SENSITIVITY)   ____________________________________________  EKG  ED ECG REPORT I, Brigida Scotti, FNP-BC personally viewed and interpreted this ECG.   Date: 11/09/2019  EKG Time: 1818  Rate: 71  Rhythm: atrial fibrillation, rate   Axis: Normal  Intervals:none  ST&T Change: No ST elevation  ____________________________________________  RADIOLOGY  ED MD interpretation: Chest x-ray consistent with mild CHF  I, Lawan Nanez, personally viewed and evaluated these images (plain radiographs) as part of my medical decision making, as well as reviewing the written report by the radiologist.  Official radiology report(s): DG Chest Port 1 View  Result Date: 11/09/2019 CLINICAL DATA:  Weakness, shortness of breath, wheezing for 1 day EXAM: PORTABLE CHEST 1 VIEW COMPARISON:  01/09/2013 FINDINGS: Single frontal view of the chest demonstrates an enlarged cardiac silhouette. There is central vascular congestion with diffuse interstitial prominence and bilateral infrahilar ground-glass airspace disease. No significant effusion or pneumothorax. IMPRESSION: 1. Findings consistent with mild congestive heart failure. Electronically Signed   By: Randa Ngo M.D.   On: 11/09/2019 19:03    ____________________________________________   PROCEDURES  Procedure(s) performed: None  Procedures  Critical Care performed:  No  ____________________________________________   INITIAL IMPRESSION / ASSESSMENT AND PLAN / ED COURSE  As part of my medical decision making, I reviewed the following data within the electronic MEDICAL RECORD NUMBER Notes from prior ED visits  84 year old male presenting to the emergency department from [place] for treatment and evaluation of bilateral lower extremity pitting edema and increasing weakness.  Daughter insisted that he come tonight for evaluation instead of waiting for his PCP tomorrow.  Patient  has dementia but denies pain or symptoms of concern. ____________________________________________  Differential diagnosis includes, but not limited to:  CHF exacerbation, peripheral edema, cardiac event, deconditioning   FINAL CLINICAL IMPRESSION(S) / ED DIAGNOSES  Chest x-ray does show mild CHF.  Plan will be to give him 40 of Lasix.  He was able to urinate approximately 350 mL without assistance.  Daughter is at bedside who is able to provide reliable medical history.  Initial troponin 43. Likely reactive to dehydration or demand.   Patient care relinquished to Dr. Charna Archer who will follow up on troponin and decide on disposition.    Final diagnoses:  Acute on chronic diastolic CHF (congestive heart failure) [facility])     ED Discharge Orders    None       Justin Manning was evaluated in Emergency Department on 11/09/2019 for the symptoms described in the history of present illness. He was evaluated in the context of the global COVID-19 pandemic, which necessitated consideration that the patient might be at risk for infection with the SARS-CoV-2 virus that causes COVID-19. Institutional protocols and algorithms that pertain to the evaluation of patients at risk for COVID-19 are in a state of rapid change based on information released by regulatory bodies including the CDC and federal and state organizations. These policies and algorithms were followed during the patient's  care in the ED.   Note:  This document was prepared using Dragon voice recognition software and may include unintentional dictation errors.   Victorino Dike, FNP 11/09/19 2048    Blake Divine, MD 11/09/19 [phone]

## 2019-11-20 ENCOUNTER — Other Ambulatory Visit: Payer: Self-pay | Admitting: Family Medicine

## 2019-11-20 DIAGNOSIS — IMO0001 Reserved for inherently not codable concepts without codable children: Secondary | ICD-10-CM

## 2019-11-20 DIAGNOSIS — I5032 Chronic diastolic (congestive) heart failure: Secondary | ICD-10-CM

## 2019-11-20 DIAGNOSIS — F0390 Unspecified dementia without behavioral disturbance: Secondary | ICD-10-CM

## 2019-11-20 DIAGNOSIS — I10 Essential (primary) hypertension: Secondary | ICD-10-CM

## 2019-11-22 DIAGNOSIS — R69 Illness, unspecified: Secondary | ICD-10-CM | POA: Diagnosis not present

## 2019-11-22 DIAGNOSIS — R0602 Shortness of breath: Secondary | ICD-10-CM | POA: Diagnosis not present

## 2019-11-22 DIAGNOSIS — D649 Anemia, unspecified: Secondary | ICD-10-CM | POA: Diagnosis not present

## 2019-11-25 DIAGNOSIS — I700 Atherosclerosis of aorta: Secondary | ICD-10-CM | POA: Diagnosis not present

## 2019-11-25 DIAGNOSIS — I5020 Unspecified systolic (congestive) heart failure: Secondary | ICD-10-CM | POA: Diagnosis not present

## 2019-11-25 DIAGNOSIS — I4819 Other persistent atrial fibrillation: Secondary | ICD-10-CM | POA: Diagnosis not present

## 2019-11-25 DIAGNOSIS — J439 Emphysema, unspecified: Secondary | ICD-10-CM | POA: Diagnosis not present

## 2019-11-28 DIAGNOSIS — R0602 Shortness of breath: Secondary | ICD-10-CM | POA: Diagnosis not present

## 2019-11-28 DIAGNOSIS — D649 Anemia, unspecified: Secondary | ICD-10-CM | POA: Diagnosis not present

## 2019-11-28 DIAGNOSIS — I10 Essential (primary) hypertension: Secondary | ICD-10-CM | POA: Diagnosis not present

## 2019-12-05 ENCOUNTER — Emergency Department: Payer: Medicare Other

## 2019-12-05 ENCOUNTER — Encounter: Payer: Self-pay | Admitting: Emergency Medicine

## 2019-12-05 ENCOUNTER — Inpatient Hospital Stay
Admission: EM | Admit: 2019-12-05 | Discharge: 2019-12-13 | DRG: 291 | Disposition: A | Discharge status: Hospice | Payer: Medicare Other | Attending: Internal Medicine | Admitting: Internal Medicine

## 2019-12-05 ENCOUNTER — Other Ambulatory Visit: Payer: Self-pay

## 2019-12-05 DIAGNOSIS — R7981 Abnormal blood-gas level: Secondary | ICD-10-CM | POA: Diagnosis not present

## 2019-12-05 DIAGNOSIS — Z66 Do not resuscitate: Secondary | ICD-10-CM | POA: Diagnosis not present

## 2019-12-05 DIAGNOSIS — R531 Weakness: Secondary | ICD-10-CM

## 2019-12-05 DIAGNOSIS — I4891 Unspecified atrial fibrillation: Secondary | ICD-10-CM | POA: Diagnosis present

## 2019-12-05 DIAGNOSIS — J9621 Acute and chronic respiratory failure with hypoxia: Secondary | ICD-10-CM | POA: Diagnosis present

## 2019-12-05 DIAGNOSIS — E860 Dehydration: Secondary | ICD-10-CM | POA: Diagnosis present

## 2019-12-05 DIAGNOSIS — Z833 Family history of diabetes mellitus: Secondary | ICD-10-CM

## 2019-12-05 DIAGNOSIS — I2510 Atherosclerotic heart disease of native coronary artery without angina pectoris: Secondary | ICD-10-CM | POA: Diagnosis present

## 2019-12-05 DIAGNOSIS — R7989 Other specified abnormal findings of blood chemistry: Secondary | ICD-10-CM | POA: Diagnosis present

## 2019-12-05 DIAGNOSIS — R0600 Dyspnea, unspecified: Secondary | ICD-10-CM | POA: Diagnosis not present

## 2019-12-05 DIAGNOSIS — G319 Degenerative disease of nervous system, unspecified: Secondary | ICD-10-CM | POA: Diagnosis not present

## 2019-12-05 DIAGNOSIS — I081 Rheumatic disorders of both mitral and tricuspid valves: Secondary | ICD-10-CM | POA: Diagnosis present

## 2019-12-05 DIAGNOSIS — N189 Chronic kidney disease, unspecified: Secondary | ICD-10-CM

## 2019-12-05 DIAGNOSIS — R0602 Shortness of breath: Secondary | ICD-10-CM

## 2019-12-05 DIAGNOSIS — Z794 Long term (current) use of insulin: Secondary | ICD-10-CM | POA: Diagnosis not present

## 2019-12-05 DIAGNOSIS — Z886 Allergy status to analgesic agent status: Secondary | ICD-10-CM | POA: Diagnosis not present

## 2019-12-05 DIAGNOSIS — Z87891 Personal history of nicotine dependence: Secondary | ICD-10-CM | POA: Diagnosis not present

## 2019-12-05 DIAGNOSIS — N1832 Chronic kidney disease, stage 3b: Secondary | ICD-10-CM | POA: Diagnosis present

## 2019-12-05 DIAGNOSIS — I5023 Acute on chronic systolic (congestive) heart failure: Secondary | ICD-10-CM | POA: Insufficient documentation

## 2019-12-05 DIAGNOSIS — I5022 Chronic systolic (congestive) heart failure: Secondary | ICD-10-CM | POA: Diagnosis present

## 2019-12-05 DIAGNOSIS — R062 Wheezing: Secondary | ICD-10-CM | POA: Diagnosis not present

## 2019-12-05 DIAGNOSIS — N400 Enlarged prostate without lower urinary tract symptoms: Secondary | ICD-10-CM | POA: Diagnosis present

## 2019-12-05 DIAGNOSIS — F0391 Unspecified dementia with behavioral disturbance: Secondary | ICD-10-CM | POA: Diagnosis not present

## 2019-12-05 DIAGNOSIS — I509 Heart failure, unspecified: Secondary | ICD-10-CM

## 2019-12-05 DIAGNOSIS — R54 Age-related physical debility: Secondary | ICD-10-CM | POA: Diagnosis present

## 2019-12-05 DIAGNOSIS — N183 Chronic kidney disease, stage 3 unspecified: Secondary | ICD-10-CM

## 2019-12-05 DIAGNOSIS — I130 Hypertensive heart and chronic kidney disease with heart failure and stage 1 through stage 4 chronic kidney disease, or unspecified chronic kidney disease: Principal | ICD-10-CM | POA: Diagnosis present

## 2019-12-05 DIAGNOSIS — N3289 Other specified disorders of bladder: Secondary | ICD-10-CM | POA: Diagnosis not present

## 2019-12-05 DIAGNOSIS — E1122 Type 2 diabetes mellitus with diabetic chronic kidney disease: Secondary | ICD-10-CM | POA: Diagnosis present

## 2019-12-05 DIAGNOSIS — F0390 Unspecified dementia without behavioral disturbance: Secondary | ICD-10-CM

## 2019-12-05 DIAGNOSIS — N289 Disorder of kidney and ureter, unspecified: Secondary | ICD-10-CM | POA: Diagnosis not present

## 2019-12-05 DIAGNOSIS — R4182 Altered mental status, unspecified: Secondary | ICD-10-CM | POA: Diagnosis not present

## 2019-12-05 DIAGNOSIS — N179 Acute kidney failure, unspecified: Secondary | ICD-10-CM | POA: Diagnosis present

## 2019-12-05 DIAGNOSIS — E870 Hyperosmolality and hypernatremia: Secondary | ICD-10-CM | POA: Diagnosis present

## 2019-12-05 DIAGNOSIS — Z7189 Other specified counseling: Secondary | ICD-10-CM | POA: Diagnosis not present

## 2019-12-05 DIAGNOSIS — I10 Essential (primary) hypertension: Secondary | ICD-10-CM | POA: Diagnosis present

## 2019-12-05 DIAGNOSIS — N1831 Chronic kidney disease, stage 3a: Secondary | ICD-10-CM | POA: Diagnosis not present

## 2019-12-05 DIAGNOSIS — Z7901 Long term (current) use of anticoagulants: Secondary | ICD-10-CM

## 2019-12-05 DIAGNOSIS — I517 Cardiomegaly: Secondary | ICD-10-CM | POA: Diagnosis not present

## 2019-12-05 DIAGNOSIS — N281 Cyst of kidney, acquired: Secondary | ICD-10-CM | POA: Diagnosis not present

## 2019-12-05 DIAGNOSIS — E871 Hypo-osmolality and hyponatremia: Secondary | ICD-10-CM | POA: Diagnosis not present

## 2019-12-05 DIAGNOSIS — R0689 Other abnormalities of breathing: Secondary | ICD-10-CM | POA: Diagnosis not present

## 2019-12-05 DIAGNOSIS — E1121 Type 2 diabetes mellitus with diabetic nephropathy: Secondary | ICD-10-CM | POA: Diagnosis not present

## 2019-12-05 DIAGNOSIS — Z515 Encounter for palliative care: Secondary | ICD-10-CM

## 2019-12-05 DIAGNOSIS — E785 Hyperlipidemia, unspecified: Secondary | ICD-10-CM | POA: Diagnosis present

## 2019-12-05 DIAGNOSIS — R0902 Hypoxemia: Secondary | ICD-10-CM | POA: Diagnosis not present

## 2019-12-05 DIAGNOSIS — Z20822 Contact with and (suspected) exposure to covid-19: Secondary | ICD-10-CM | POA: Diagnosis present

## 2019-12-05 DIAGNOSIS — Z7401 Bed confinement status: Secondary | ICD-10-CM | POA: Diagnosis not present

## 2019-12-05 DIAGNOSIS — J811 Chronic pulmonary edema: Secondary | ICD-10-CM | POA: Diagnosis not present

## 2019-12-05 DIAGNOSIS — Z993 Dependence on wheelchair: Secondary | ICD-10-CM

## 2019-12-05 DIAGNOSIS — M2550 Pain in unspecified joint: Secondary | ICD-10-CM | POA: Diagnosis not present

## 2019-12-05 DIAGNOSIS — R069 Unspecified abnormalities of breathing: Secondary | ICD-10-CM | POA: Diagnosis not present

## 2019-12-05 DIAGNOSIS — F03918 Unspecified dementia, unspecified severity, with other behavioral disturbance: Secondary | ICD-10-CM | POA: Insufficient documentation

## 2019-12-05 DIAGNOSIS — J90 Pleural effusion, not elsewhere classified: Secondary | ICD-10-CM | POA: Diagnosis not present

## 2019-12-05 DIAGNOSIS — K219 Gastro-esophageal reflux disease without esophagitis: Secondary | ICD-10-CM | POA: Diagnosis present

## 2019-12-05 DIAGNOSIS — Z79899 Other long term (current) drug therapy: Secondary | ICD-10-CM

## 2019-12-05 DIAGNOSIS — G9389 Other specified disorders of brain: Secondary | ICD-10-CM | POA: Diagnosis not present

## 2019-12-05 DIAGNOSIS — R404 Transient alteration of awareness: Secondary | ICD-10-CM | POA: Diagnosis not present

## 2019-12-05 DIAGNOSIS — I480 Paroxysmal atrial fibrillation: Secondary | ICD-10-CM | POA: Diagnosis not present

## 2019-12-05 LAB — BLOOD GAS, VENOUS
Acid-Base Excess: 0.5 mmol/L (ref 0.0–2.0)
Bicarbonate: 25.4 mmol/L (ref 20.0–28.0)
O2 Saturation: 29.3 %
Patient temperature: 37
pCO2, Ven: 41 mmHg — ABNORMAL LOW (ref 44.0–60.0)
pH, Ven: 7.4 (ref 7.250–7.430)
pO2, Ven: <31 mmHg — CL (ref 32.0–45.0)

## 2019-12-05 LAB — CBC WITH DIFFERENTIAL/PLATELET
Abs Immature Granulocytes: 0.04 10*3/uL (ref 0.00–0.07)
Basophils Absolute: 0 10*3/uL (ref 0.0–0.1)
Basophils Relative: 1 %
Eosinophils Absolute: 0.3 10*3/uL (ref 0.0–0.5)
Eosinophils Relative: 4 %
HCT: 37.4 % — ABNORMAL LOW (ref 39.0–52.0)
Hemoglobin: 11.8 g/dL — ABNORMAL LOW (ref 13.0–17.0)
Immature Granulocytes: 1 %
Lymphocytes Relative: 19 %
Lymphs Abs: 1.3 10*3/uL (ref 0.7–4.0)
MCH: 30.1 pg (ref 26.0–34.0)
MCHC: 31.6 g/dL (ref 30.0–36.0)
MCV: 95.4 fL (ref 80.0–100.0)
Monocytes Absolute: 0.5 10*3/uL (ref 0.1–1.0)
Monocytes Relative: 8 %
Neutro Abs: 4.6 10*3/uL (ref 1.7–7.7)
Neutrophils Relative %: 67 %
Platelets: 187 10*3/uL (ref 150–400)
RBC: 3.92 MIL/uL — ABNORMAL LOW (ref 4.22–5.81)
RDW: 16.5 % — ABNORMAL HIGH (ref 11.5–15.5)
WBC: 6.8 10*3/uL (ref 4.0–10.5)
nRBC: 0.4 % — ABNORMAL HIGH (ref 0.0–0.2)

## 2019-12-05 LAB — BASIC METABOLIC PANEL
Anion gap: 10 (ref 5–15)
BUN: 54 mg/dL — ABNORMAL HIGH (ref 8–23)
CO2: 25 mmol/L (ref 22–32)
Calcium: 9 mg/dL (ref 8.9–10.3)
Chloride: 117 mmol/L — ABNORMAL HIGH (ref 98–111)
Creatinine, Ser: 2.71 mg/dL — ABNORMAL HIGH (ref 0.61–1.24)
GFR calc Af Amer: 24 mL/min — ABNORMAL LOW (ref >60)
GFR calc non Af Amer: 21 mL/min — ABNORMAL LOW (ref >60)
Glucose, Bld: 134 mg/dL — ABNORMAL HIGH (ref 70–99)
Potassium: 4.7 mmol/L (ref 3.5–5.1)
Sodium: 152 mmol/L — ABNORMAL HIGH (ref 135–145)

## 2019-12-05 LAB — TROPONIN I (HIGH SENSITIVITY)
Troponin I (High Sensitivity): 48 ng/L — ABNORMAL HIGH (ref <18)
Troponin I (High Sensitivity): 47 ng/L — ABNORMAL HIGH (ref <18)

## 2019-12-05 LAB — BRAIN NATRIURETIC PEPTIDE: B Natriuretic Peptide: 1679.7 pg/mL — ABNORMAL HIGH (ref 0.0–100.0)

## 2019-12-05 MED ORDER — FUROSEMIDE 10 MG/ML IJ SOLN
40.0000 mg | Freq: Once | INTRAMUSCULAR | Status: AC
  Administered 2019-12-05: 40 mg via INTRAVENOUS
  Filled 2019-12-05: qty 4

## 2019-12-05 MED ORDER — DEXTROSE 5 % IV BOLUS
500.0000 mL | Freq: Once | INTRAVENOUS | Status: AC
  Administered 2019-12-06: 500 mL via INTRAVENOUS
  Filled 2019-12-05: qty 500

## 2019-12-05 NOTE — ED Notes (Signed)
Pt attempting to exit the bed. Pt repositioned in bed, instructed on call bell. Bed alarm applied.

## 2019-12-05 NOTE — Discharge Instructions (Signed)
Lab tests today showed slight worsening of kidney function compared to prior levels. Please hold tomorrow's dose of torsemide and try to gently increase water intake.  Follow up with primary care or nephrology this week and plan to recheck labs including Metabolic Panel.

## 2019-12-05 NOTE — ED Triage Notes (Signed)
Pt to ED via [facility] from [facility] for shortness of breath. Per EMS initial SpO2 90-94% on room air. Pt was placed on 2 liters O2 via [place] with sats improving to 99%. Pt states that he felt better when EMS sat him up on stretcher. Pt is A & O at this time, in NAD. EDP at bedside upon ems arrival.

## 2019-12-05 NOTE — ED Provider Notes (Addendum)
[facility] Emergency Department Provider Note  ____________________________________________  Time seen: Approximately 6:24 PM  I have reviewed the triage vital signs and the nursing notes.   HISTORY  Chief Complaint Shortness of Breath    HPI Justin Manning is a 84 y.o. male with a history of dementia diabetes hypertension CKD who was sent to the ED from [place] due to shortness of breath, gradual onset, constant and worsening over the past week.  No cough or chest pain, denies fever body aches or other pain complaints.  Worse lying down, better sitting upright.  EMS report room air oxygen saturation of about 90%, improved with 2 to 4 L nasal cannula.      Past Medical History:  Diagnosis Date  . Anemia   . CKD (chronic kidney disease), stage III    Dr. Juleen China  . Dementia ([place])   . Diabetes mellitus without complication ([place])   . GERD (gastroesophageal reflux disease)   . Hyperlipidemia   . Hypertension   . Mitral regurgitation    Mild     Patient Active Problem List   Diagnosis Date Noted  . Acute kidney injury superimposed on CKD ([place]) 12/05/2019  . Acute on chronic systolic CHF (congestive heart failure) ([place]) 12/05/2019  . Hypokalemia   . Elevated troponin   . Fall 09/29/2019  . History of alcoholism ([place]) 01/05/2017  . Hiatal hernia 04/11/2015  . Emphysema lung ([place]) 04/11/2015  . Protein malnutrition ([place]) 03/18/2015  . Anemia in chronic illness 11/09/2014  . Abdominal aortic atherosclerosis ([place]) 11/09/2014  . A-fib ([place]) 11/09/2014  . Atrial hypertrophy 11/09/2014  . Carotid arterial disease ([place]) 11/09/2014  . Chronic kidney disease (CKD), stage III (moderate) 11/09/2014  . Diabetes mellitus with renal manifestations, uncontrolled ([place]) 11/09/2014  . Diastolic dysfunction with heart failure ([place]) 11/09/2014  . Dysfunction of eustachian tube 11/09/2014  . Type 2 diabetes mellitus with stage 3 chronic kidney disease  ([place]) 11/09/2014  . Essential (primary) hypertension 11/09/2014  . Gastro-esophageal reflux disease without esophagitis 11/09/2014  . Hearing loss of left ear 11/09/2014  . H/O carotid endarterectomy 11/09/2014  . H/O malignant neoplasm of colon 11/09/2014  . H/O infectious disease 11/09/2014  . H/O malignant neoplasm of prostate 11/09/2014  . Lung nodule, solitary 11/09/2014  . Mild cognitive disorder 11/09/2014  . TI (tricuspid incompetence) 11/09/2014     Past Surgical History:  Procedure Laterality Date  . ENDARTERECTOMY Left    Carotid  . KNEE SURGERY Right   . PARTIAL COLECTOMY     with Anastomosis  . SHOULDER SURGERY Right      Prior to Admission medications   Medication Sig Start Date End Date Taking? Authorizing Provider  bisacodyl (DULCOLAX) 5 MG EC tablet Take 1 tablet (5 mg total) by mouth daily as needed for moderate constipation. 10/06/19   Lorella Nimrod, MD  carvedilol (COREG) 6.25 MG tablet Take 1 tablet (6.25 mg total) by mouth 2 (two) times daily with a meal. 10/06/19   Lorella Nimrod, MD  Cholecalciferol 50 MCG (2000 UT) CAPS Take 2,000 Units by mouth 2 (two) times daily.     [provider]  Cyanocobalamin (VITAMIN B 12) 100 MCG LOZG Take 100 mcg by mouth daily.     [provider]  dabigatran (PRADAXA) 75 MG CAPS capsule Take 75 mg by mouth 2 (two) times daily.     [provider]  diltiazem (CARTIA XT) 120 MG 24 hr capsule Take 120 mg by mouth  daily.  11/08/16   [provider]  furosemide (LASIX) 20 MG tablet Take 1 tablet (20 mg total) by mouth daily. 11/09/19 12/09/19  Blake Divine, MD  glucose blood (FREESTYLE LITE) test strip TEST BLOOD SUGAR THREE TIMES DAILY AS DIRECTED.. 11/11/18   Steele Sizer, MD  hydrALAZINE (APRESOLINE) 100 MG tablet Take 1 tablet (100 mg total) by mouth 3 (three) times daily. Patient not taking: Reported on 11/09/2019 10/06/19   Lorella Nimrod, MD  Insulin Pen Needle (B-D ULTRAFINE III SHORT PEN)  31G X 8 MM MISC USE DAILY WITH LANTUS 03/04/19   Steele Sizer, MD  isosorbide mononitrate (IMDUR) 30 MG 24 hr tablet Take 1 tablet (30 mg total) by mouth daily. 10/06/19   Lorella Nimrod, MD  LANTUS SOLOSTAR 100 UNIT/ML Solostar Pen INJECT 8 UNITS UNDER THE SKIN DAILY Patient taking differently: Inject 8 Units into the skin at bedtime.  08/26/19   Sowles, Drue Stager, MD  lisinopril (ZESTRIL) 20 MG tablet TAKE 1 TABLET(20 MG) BY MOUTH TWICE DAILY 11/20/19   Ancil Boozer, Drue Stager, MD  memantine (NAMENDA) 5 MG tablet TAKE 1 TABLET(5 MG) BY MOUTH TWICE DAILY 11/20/19   Steele Sizer, MD  Multiple Vitamin (MULTIVITAMIN WITH MINERALS) TABS tablet Take 1 tablet by mouth daily. 10/06/19   Lorella Nimrod, MD  ondansetron (ZOFRAN) 4 MG tablet Take 1 tablet (4 mg total) by mouth every 6 (six) hours as needed for nausea. 10/06/19   Lorella Nimrod, MD  polyethylene glycol (MIRALAX / GLYCOLAX) 17 g packet Take 17 g by mouth daily as needed for mild constipation or moderate constipation. 10/06/19   Lorella Nimrod, MD  QUEtiapine (SEROQUEL) 25 MG tablet Take 1 tablet (25 mg total) by mouth at bedtime. 10/06/19   Lorella Nimrod, MD  rosuvastatin (CRESTOR) 20 MG tablet Take 20 mg by mouth at bedtime. 10/28/19   [provider]     Allergies Aspirin   Family History  Problem Relation Age of Onset  . Alcohol abuse Brother   . Diabetes Daughter     Social History Social History   Tobacco Use  . Smoking status: Former Smoker    Packs/day: 1.00    Years: 10.00    Pack years: 10.00    Types: Cigarettes    Quit date: 05/15/1988    Years since quitting: 31.5  . Smokeless tobacco: Never Used  . Tobacco comment: smoking cessation materials not required  Vaping Use  . Vaping Use: Never used  Substance Use Topics  . Alcohol use: No    Alcohol/week: 0.0 standard drinks    Comment: used to be a heavy drinker but quit 40 years ago   . Drug use: Not Currently    Types: Marijuana    Comment: many years ago      Review of Systems  Constitutional:   No fever or chills.  ENT:   No sore throat. No rhinorrhea. Cardiovascular:   No chest pain or syncope. Respiratory:   Positive shortness of breath as above without cough. Gastrointestinal:   Negative for abdominal pain, vomiting and diarrhea.  Musculoskeletal:   Chronic peripheral edema All other systems reviewed and are negative except as documented above in ROS and HPI.  ____________________________________________   PHYSICAL EXAM:  VITAL SIGNS: ED Triage Vitals  Enc Vitals Group     BP --      Pulse Rate 12/05/19 1820 90     Resp 12/05/19 1820 18     Temp --      Temp src --  SpO2 12/05/19 1820 100 %     Weight --      Height --      Head Circumference --      Peak Flow --      Pain Score 12/05/19 1821 0     Pain Loc --      Pain Edu? --      Excl. in [place]? --     Vital signs reviewed, nursing assessments reviewed.   Constitutional:   Alert and oriented. Non-toxic appearance. Eyes:   Conjunctivae are normal. EOMI. PERRL. ENT      Head:   Normocephalic and atraumatic.      Nose:   Normal.      Mouth/Throat:   Moist mucosa, no oral lesions or pharyngeal erythema.      Neck:   No meningismus. Full ROM. Hematological/Lymphatic/Immunilogical:   No cervical lymphadenopathy. Cardiovascular:   RRR. Symmetric bilateral radial and DP pulses.  No murmurs. Cap refill less than 2 seconds. Respiratory:   Normal respiratory effort without tachypnea/retractions.  Diminished breath sounds in the right lower lung, concerning for pleural effusion. Gastrointestinal:   Soft and nontender. Non distended. There is no CVA tenderness.  No rebound, rigidity, or guarding.  Musculoskeletal:   Normal range of motion in all extremities. No joint effusions.  No lower extremity tenderness.  1+ pitting edema bilateral lower extremities, symmetric calf circumference, no calf tenderness. Neurologic:   Normal speech and language.  Motor grossly  intact. No acute focal neurologic deficits are appreciated.  Skin:    Skin is warm, dry and intact. No rash noted.  No petechiae, purpura, or bullae.  ____________________________________________    LABS (pertinent positives/negatives) (all labs ordered are listed, but only abnormal results are displayed) Labs Reviewed  BASIC METABOLIC PANEL - Abnormal; Notable for the following components:      Result Value   Sodium 152 (*)    Chloride 117 (*)    Glucose, Bld 134 (*)    BUN 54 (*)    Creatinine, Ser 2.71 (*)    GFR calc non Af Amer 21 (*)    GFR calc Af Amer 24 (*)    All other components within normal limits  BRAIN NATRIURETIC PEPTIDE - Abnormal; Notable for the following components:   B Natriuretic Peptide 1,679.7 (*)    All other components within normal limits  CBC WITH DIFFERENTIAL/PLATELET - Abnormal; Notable for the following components:   RBC 3.92 (*)    Hemoglobin 11.8 (*)    HCT 37.4 (*)    RDW 16.5 (*)    nRBC 0.4 (*)    All other components within normal limits  BLOOD GAS, VENOUS - Abnormal; Notable for the following components:   pCO2, Ven 41 (*)    pO2, Ven <31.0 (*)    All other components within normal limits  TROPONIN I (HIGH SENSITIVITY) - Abnormal; Notable for the following components:   Troponin I (High Sensitivity) 48 (*)    All other components within normal limits  TROPONIN I (HIGH SENSITIVITY) - Abnormal; Notable for the following components:   Troponin I (High Sensitivity) 47 (*)    All other components within normal limits   ____________________________________________   EKG  Interpreted by me Atrial fibrillation rate of 90, normal axis and intervals.  Poor R wave progression.  Normal ST segments and T waves, no acute ischemic changes.  ____________________________________________    RADIOLOGY  CT HEAD WO CONTRAST  Result Date: 12/05/2019 CLINICAL DATA:  Altered  mental status and shortness of breath. EXAM: CT HEAD WITHOUT CONTRAST  TECHNIQUE: Contiguous axial images were obtained from the base of the skull through the vertex without intravenous contrast. COMPARISON:  Sep 29, 2019 FINDINGS: Brain: There is moderate severity cerebral atrophy with widening of the extra-axial spaces and ventricular dilatation. There are areas of decreased attenuation within the white matter tracts of the supratentorial brain, consistent with microvascular disease changes. Vascular: No hyperdense vessel or unexpected calcification. Skull: Normal. Negative for fracture or focal lesion. Sinuses/Orbits: No acute finding. Other: None. IMPRESSION: 1. Generalized cerebral atrophy. 2. No acute intracranial abnormality. Electronically Signed   By: Virgina Norfolk M.D.   On: 12/05/2019 23:13   DG Chest Portable 1 View  Result Date: 12/05/2019 CLINICAL DATA:  Dyspnea EXAM: PORTABLE CHEST 1 VIEW COMPARISON:  11/09/2019 FINDINGS: The lungs are symmetrically expanded. Interstitial pulmonary edema has improved since prior examination, though has not yet completely resolved. Small right pleural effusion now present. No pneumothorax. Mild to moderate cardiomegaly appears stable. No acute bone abnormality. IMPRESSION: Improving, but persistent cardiogenic failure. Electronically Signed   By: Fidela Salisbury MD   On: 12/05/2019 18:56    ____________________________________________   PROCEDURES Procedures  ____________________________________________  DIFFERENTIAL DIAGNOSIS   Pleural effusion, pulmonary edema, pneumonia, non-STEMI, viral illness, pneumothorax  CLINICAL IMPRESSION / ASSESSMENT AND PLAN / ED COURSE  Medications ordered in the ED: Medications  dextrose 5 % bolus 500 mL (has no administration in time range)  furosemide (LASIX) injection 40 mg (40 mg Intravenous Given 12/05/19 1923)    Pertinent labs & imaging results that were available during my care of the patient were reviewed by me and considered in my medical decision making (see chart for  details).  Justin Manning was evaluated in Emergency Department on 12/05/2019 for the symptoms described in the history of present illness. He was evaluated in the context of the global COVID-19 pandemic, which necessitated consideration that the patient might be at risk for infection with the SARS-CoV-2 virus that causes COVID-19. Institutional protocols and algorithms that pertain to the evaluation of patients at risk for COVID-19 are in a state of rapid change based on information released by regulatory bodies including the CDC and federal and state organizations. These policies and algorithms were followed during the patient's care in the ED.   Patient presents with shortness of breath, has abnormal lung exam with diminished breath sounds on the right lower lung.  Will obtain chest x-ray, labs, continue nasal cannula oxygen for comfort.  Clinical Course as of Dec 04 2316  Fri Dec 05, 2019  1907 Reviewed EMR. Pt seen by Dr. Ouida Sills today, felt to be at baseline except for frequent symptomatic episodes related to CHF. Lasix changed to torsemide, aldactone added. Will f/u labs, give IV lasix. If labs are reassuring, pt is likely stable for DC back to SNF.   [PS]  2059 Labs actually show slight acute on chronic renal insufficiency, some free water deficit. Appears substantially dehydrated from diuretic and water restriction.  Will obtain CT head to eval for acute infarct or hemorrhage, plan to admit for hydration and resp monitoring.  Also exhibits some periodic breathing, causing so2 to range from 77% to 98% on RA   [PS]    Clinical Course User Index [PS] Carrie Mew, MD     ____________________________________________   FINAL CLINICAL IMPRESSION(S) / ED DIAGNOSES    Final diagnoses:  Chronic congestive heart failure, unspecified heart failure type ([place])  Acute on chronic  renal insufficiency  Dementia without behavioral disturbance, unspecified dementia type ([place])  Dehydration      ED Discharge Orders    None      Portions of this note were generated with dragon dictation software. Dictation errors may occur despite best attempts at proofreading.   Carrie Mew, MD 12/05/19 [place], MD 12/05/19 [phone]

## 2019-12-05 NOTE — ED Notes (Signed)
Brief changed and linens changed. Pt removed IV and monitoring devices.

## 2019-12-06 ENCOUNTER — Encounter: Payer: Self-pay | Admitting: Internal Medicine

## 2019-12-06 DIAGNOSIS — R531 Weakness: Secondary | ICD-10-CM | POA: Diagnosis not present

## 2019-12-06 LAB — GLUCOSE, CAPILLARY
Glucose-Capillary: 129 mg/dL — ABNORMAL HIGH (ref 70–99)
Glucose-Capillary: 158 mg/dL — ABNORMAL HIGH (ref 70–99)
Glucose-Capillary: 203 mg/dL — ABNORMAL HIGH (ref 70–99)
Glucose-Capillary: 87 mg/dL (ref 70–99)
Glucose-Capillary: 93 mg/dL (ref 70–99)

## 2019-12-06 LAB — MRSA PCR SCREENING: MRSA by PCR: NEGATIVE

## 2019-12-06 LAB — BASIC METABOLIC PANEL
Anion gap: 12 (ref 5–15)
BUN: 55 mg/dL — ABNORMAL HIGH (ref 8–23)
CO2: 23 mmol/L (ref 22–32)
Calcium: 9.3 mg/dL (ref 8.9–10.3)
Chloride: 118 mmol/L — ABNORMAL HIGH (ref 98–111)
Creatinine, Ser: 2.84 mg/dL — ABNORMAL HIGH (ref 0.61–1.24)
GFR calc Af Amer: 23 mL/min — ABNORMAL LOW (ref >60)
GFR calc non Af Amer: 20 mL/min — ABNORMAL LOW (ref >60)
Glucose, Bld: 126 mg/dL — ABNORMAL HIGH (ref 70–99)
Potassium: 5.2 mmol/L — ABNORMAL HIGH (ref 3.5–5.1)
Sodium: 153 mmol/L — ABNORMAL HIGH (ref 135–145)

## 2019-12-06 LAB — HEMOGLOBIN A1C
Hgb A1c MFr Bld: 7 % — ABNORMAL HIGH (ref 4.8–5.6)
Mean Plasma Glucose: 154.2 mg/dL

## 2019-12-06 MED ORDER — ISOSORBIDE MONONITRATE ER 30 MG PO TB24
30.0000 mg | ORAL_TABLET | Freq: Every day | ORAL | Status: DC
  Administered 2019-12-06 – 2019-12-13 (×8): 30 mg via ORAL
  Filled 2019-12-06 (×8): qty 1

## 2019-12-06 MED ORDER — SODIUM CHLORIDE 0.9% FLUSH
3.0000 mL | Freq: Two times a day (BID) | INTRAVENOUS | Status: DC
  Administered 2019-12-06 – 2019-12-12 (×12): 3 mL via INTRAVENOUS

## 2019-12-06 MED ORDER — FUROSEMIDE 10 MG/ML IJ SOLN: 20.0000 mg | Freq: Two times a day (BID) | INTRAMUSCULAR | Status: DC

## 2019-12-06 MED ORDER — SODIUM CHLORIDE 0.9% FLUSH: 3.0000 mL | INTRAVENOUS | Status: DC | PRN

## 2019-12-06 MED ORDER — ONDANSETRON HCL 4 MG/2ML IJ SOLN: 4.0000 mg | Freq: Four times a day (QID) | INTRAMUSCULAR | Status: DC | PRN

## 2019-12-06 MED ORDER — INSULIN GLARGINE 100 UNIT/ML ~~LOC~~ SOLN
8.0000 [IU] | Freq: Every day | SUBCUTANEOUS | Status: DC
  Administered 2019-12-08 – 2019-12-09 (×2): 8 [IU] via SUBCUTANEOUS
  Filled 2019-12-06 (×5): qty 0.08

## 2019-12-06 MED ORDER — QUETIAPINE FUMARATE 25 MG PO TABS
25.0000 mg | ORAL_TABLET | Freq: Every day | ORAL | Status: DC
  Administered 2019-12-06 – 2019-12-12 (×8): 25 mg via ORAL
  Filled 2019-12-06 (×8): qty 1

## 2019-12-06 MED ORDER — CARVEDILOL 6.25 MG PO TABS
6.2500 mg | ORAL_TABLET | Freq: Two times a day (BID) | ORAL | Status: DC
  Administered 2019-12-06 – 2019-12-12 (×13): 6.25 mg via ORAL
  Filled 2019-12-06 (×13): qty 1

## 2019-12-06 MED ORDER — ACETAMINOPHEN 325 MG PO TABS
650.0000 mg | ORAL_TABLET | ORAL | Status: DC | PRN
  Administered 2019-12-06 – 2019-12-10 (×3): 650 mg via ORAL
  Filled 2019-12-06 (×3): qty 2

## 2019-12-06 MED ORDER — DEXTROSE 5 % IV SOLN: INTRAVENOUS | Status: DC

## 2019-12-06 MED ORDER — DABIGATRAN ETEXILATE MESYLATE 75 MG PO CAPS
75.0000 mg | ORAL_CAPSULE | Freq: Two times a day (BID) | ORAL | Status: DC
  Administered 2019-12-06 – 2019-12-13 (×15): 75 mg via ORAL
  Filled 2019-12-06 (×16): qty 1

## 2019-12-06 MED ORDER — FUROSEMIDE 20 MG PO TABS: 20.0000 mg | ORAL_TABLET | Freq: Every day | ORAL | Status: DC

## 2019-12-06 MED ORDER — INSULIN ASPART 100 UNIT/ML ~~LOC~~ SOLN
0.0000 [IU] | Freq: Three times a day (TID) | SUBCUTANEOUS | Status: DC
  Administered 2019-12-06 – 2019-12-08 (×4): 2 [IU] via SUBCUTANEOUS
  Filled 2019-12-06 (×4): qty 1

## 2019-12-06 MED ORDER — INSULIN ASPART 100 UNIT/ML ~~LOC~~ SOLN
0.0000 [IU] | Freq: Every day | SUBCUTANEOUS | Status: DC
  Administered 2019-12-06 – 2019-12-10 (×2): 2 [IU] via SUBCUTANEOUS
  Filled 2019-12-06 (×2): qty 1

## 2019-12-06 MED ORDER — MEMANTINE HCL 5 MG PO TABS
5.0000 mg | ORAL_TABLET | Freq: Two times a day (BID) | ORAL | Status: DC
  Administered 2019-12-06 – 2019-12-13 (×16): 5 mg via ORAL
  Filled 2019-12-06 (×17): qty 1

## 2019-12-06 MED ORDER — SODIUM CHLORIDE 0.9 % IV SOLN: 250.0000 mL | INTRAVENOUS | Status: DC | PRN

## 2019-12-06 NOTE — ED Notes (Signed)
Fall pad applied to base of bed. Hospital sock applied

## 2019-12-06 NOTE — Progress Notes (Addendum)
No complaints.  No shortness of breath or chest pain.  He is alert and oriented to person only.  Chest is clear to auscultation bilaterally.  No lower extremity edema.  Patient appears to be on the dry side.  Lasix has been discontinued.  Continue IV fluids for hydration.  Check Reds vest reading.  Continue to monitor.  Plan discussed with cardiologist, Dr. Ubaldo Glassing.

## 2019-12-06 NOTE — Plan of Care (Signed)
  Problem: Safety: Goal: Ability to remain free from injury will improve Outcome: Progressing   

## 2019-12-06 NOTE — Progress Notes (Addendum)
Unable to screening assessment. Pt is only alert to self. Pt intermittently gasping, puffiing respirations that change to normal unlabored breathing VSS except BP 122/101 MAP 109. Notify Ouma NP. Reds vest was not done pt is confused and unable to answer height. Will notify incoming shift. Will continue to monitor.

## 2019-12-06 NOTE — Consult Note (Signed)
Cardiology Consultation Note    Patient ID: Justin Manning, MRN: [id number], DOB/AGE: 09/10/1935 84 y.o. Admit date: 12/05/2019   Date of Consult: 12/06/2019 Primary Physician: Justin Sizer, MD Primary Cardiologist: Justin Manning  Chief Complaint: weakness Reason for Consultation: Justin Manning Requesting MD: Dr. Mal Manning  HPI: Justin Manning is a 84 y.o. male with history of diabetes, hypertension, dementia, HFrEF with an EF of 25 to 30% by echo in 2021, atrial fibrillation treated with rate control and anticoagulated with Pradaxa, CKD who presented to the emergency room with weakness and shortness of breath progressing over the last several weeks.  He recently was seen in our office.  We made some slight adjuncts months to his diuretics including adding spironolactone and furosemide.  Pulse ox was 90% per EMS improved to 95% on 2 L per nasal cannula.  In the emergency room he was hemodynamically stable.  BNP was 1679.  Troponin was flat in the upper 40s.  He is ruled out for myocardial infarction.  He had acute on chronic renal insufficiency with a creatinine of 2.71 up from 1.8.  Sodium was 152.  Chest x-ray was improved from 1 done in June 2021.  EKG showed A. fib with controlled ventricular response.  Past Medical History:  Diagnosis Date  . Anemia   . CKD (chronic kidney disease), stage III    Dr. Juleen Manning  . Dementia ([place])   . Diabetes mellitus without complication ([place])   . GERD (gastroesophageal reflux disease)   . Hyperlipidemia   . Hypertension   . Mitral regurgitation    Mild      Surgical History:  Past Surgical History:  Procedure Laterality Date  . ENDARTERECTOMY Left    Carotid  . KNEE SURGERY Right   . PARTIAL COLECTOMY     with Anastomosis  . SHOULDER SURGERY Right      Home Meds: Prior to Admission medications   Medication Sig Start Date End Date Taking? Authorizing Provider  bisacodyl (DULCOLAX) 5 MG EC tablet Take 1 tablet (5 mg total) by mouth daily as needed for  moderate constipation. 10/06/19  Yes Justin Nimrod, MD  carvedilol (COREG) 6.25 MG tablet Take 1 tablet (6.25 mg total) by mouth 2 (two) times daily with a meal. 10/06/19  Yes Justin Nimrod, MD  Cholecalciferol 25 MCG (1000 UT) tablet Take 2,000 Units by mouth 2 (two) times daily.    Yes [provider]  Cyanocobalamin (VITAMIN B 12) 100 MCG LOZG Take 100 mcg by mouth daily.    Yes [provider]  dabigatran (PRADAXA) 75 MG CAPS capsule Take 75 mg by mouth 2 (two) times daily.    Yes [provider]  glucose blood (FREESTYLE LITE) test strip TEST BLOOD SUGAR THREE TIMES DAILY AS DIRECTED.. 11/11/18  Yes Manning, Justin Stager, MD  Insulin Pen Needle (B-D ULTRAFINE III SHORT PEN) 31G X 8 MM MISC USE DAILY WITH LANTUS 03/04/19  Yes Manning, Justin Stager, MD  isosorbide mononitrate (IMDUR) 30 MG 24 hr tablet Take 1 tablet (30 mg total) by mouth daily. 10/06/19  Yes Justin Nimrod, MD  LANTUS SOLOSTAR 100 UNIT/ML Solostar Pen INJECT 8 UNITS UNDER THE SKIN DAILY Patient taking differently: Inject 8 Units into the skin at bedtime.  08/26/19  Yes Manning, Justin Stager, MD  lisinopril (ZESTRIL) 20 MG tablet TAKE 1 TABLET(20 MG) BY MOUTH TWICE DAILY Patient taking differently: Take 20 mg by mouth in the morning and at bedtime.  11/20/19  Yes Justin Sizer, MD  memantine [facility])  5 MG tablet TAKE 1 TABLET(5 MG) BY MOUTH TWICE DAILY Patient taking differently: Take 5 mg by mouth 2 (two) times daily.  11/20/19  Yes Manning, Justin Stager, MD  Multiple Vitamin (MULTIVITAMIN WITH MINERALS) TABS tablet Take 1 tablet by mouth daily. 10/06/19  Yes Justin Nimrod, MD  ondansetron (ZOFRAN) 4 MG tablet Take 1 tablet (4 mg total) by mouth every 6 (six) hours as needed for nausea. 10/06/19  Yes Justin Nimrod, MD  polyethylene glycol (MIRALAX / GLYCOLAX) 17 g packet Take 17 g by mouth daily as needed for mild constipation or moderate constipation. 10/06/19  Yes Justin Nimrod, MD  QUEtiapine (SEROQUEL) 25 MG tablet Take 1 tablet  (25 mg total) by mouth at bedtime. 10/06/19  Yes Justin Nimrod, MD  furosemide (LASIX) 20 MG tablet Take 1 tablet (20 mg total) by mouth daily. Patient not taking: Reported on 12/05/2019 11/09/19 12/09/19  Justin Divine, MD  hydrALAZINE (APRESOLINE) 100 MG tablet Take 1 tablet (100 mg total) by mouth 3 (three) times daily. Patient not taking: Reported on 11/09/2019 10/06/19   Justin Nimrod, MD  potassium chloride SA (KLOR-CON) 20 MEQ tablet Take 20 mEq by mouth daily.    [provider]  spironolactone (ALDACTONE) 25 MG tablet Take 25 mg by mouth daily.    [provider]  torsemide (DEMADEX) 20 MG tablet Take 40 mg by mouth daily.    [provider]    Inpatient Medications:  . carvedilol  6.25 mg Oral BID WC  . dabigatran  75 mg Oral BID  . insulin aspart  0-15 Units Subcutaneous TID WC  . insulin aspart  0-5 Units Subcutaneous QHS  . insulin glargine  8 Units Subcutaneous Daily  . isosorbide mononitrate  30 mg Oral Daily  . memantine  5 mg Oral BID  . QUEtiapine  25 mg Oral QHS  . sodium chloride flush  3 mL Intravenous Q12H   . sodium chloride    . dextrose Stopped (12/06/19 0550)    Allergies:  Allergies  Allergen Reactions  . Aspirin     tachycardia    Social History   Socioeconomic History  . Marital status: Widowed    Spouse name: Justin Manning  . Number of children: 3  . Years of education: Not on file  . Highest education level: 11th grade  Occupational History  . Occupation: retired  Tobacco Use  . Smoking status: Former Smoker    Packs/day: 1.00    Years: 10.00    Pack years: 10.00    Types: Cigarettes    Quit date: 05/15/1988    Years since quitting: 31.5  . Smokeless tobacco: Never Used  . Tobacco comment: smoking cessation materials not required  Vaping Use  . Vaping Use: Never used  Substance and Sexual Activity  . Alcohol use: No    Alcohol/week: 0.0 standard drinks    Comment: used to be a heavy drinker but quit 40 years ago    . Drug use: Not Currently    Types: Marijuana    Comment: many years ago   . Sexual activity: Not Currently  Other Topics Concern  . Not on file  Social History Narrative   He lives alone   Social Determinants of Health   Financial Resource Strain:   . Difficulty of Paying Living Expenses:   Food Insecurity:   . Worried About Charity fundraiser in the Last Year:   . Arboriculturist in the Last Year:   [employer]  Needs:   . Lack of Transportation (Medical):   Marland Kitchen Lack of Transportation (Non-Medical):   Physical Activity:   . Days of Exercise per Week:   . Minutes of Exercise per Session:   Stress:   . Feeling of Stress :   Social Connections:   . Frequency of Communication with Friends and Family:   . Frequency of Social Gatherings with Friends and Family:   . Attends Religious Services:   . Active Member of Clubs or Organizations:   . Attends Archivist Meetings:   Marland Kitchen Marital Status:   Intimate Partner Violence:   . Fear of Current or Ex-Partner:   . Emotionally Abused:   Marland Kitchen Physically Abused:   . Sexually Abused:      Family History  Problem Relation Age of Onset  . Alcohol abuse Brother   . Diabetes Daughter      Review of Systems: A 12-system review of systems was performed and is negative except as noted in the HPI.  Labs: No results for input(s): CKTOTAL, CKMB, TROPONINI in the last 72 hours. Lab Results  Component Value Date   WBC 6.8 12/05/2019   HGB 11.8 (L) 12/05/2019   HCT 37.4 (L) 12/05/2019   MCV 95.4 12/05/2019   PLT 187 12/05/2019    Recent Labs  Lab 12/05/19 1850  NA 152*  K 4.7  CL 117*  CO2 25  BUN 54*  CREATININE 2.71*  CALCIUM 9.0  GLUCOSE 134*   Lab Results  Component Value Date   CHOL 152 09/03/2019   HDL 62 09/03/2019   LDLCALC 77 09/03/2019   TRIG 51 09/03/2019   No results found for: DDIMER  Radiology/Studies:  CT HEAD WO CONTRAST  Result Date: 12/05/2019 CLINICAL DATA:  Altered mental status and  shortness of breath. EXAM: CT HEAD WITHOUT CONTRAST TECHNIQUE: Contiguous axial images were obtained from the base of the skull through the vertex without intravenous contrast. COMPARISON:  Sep 29, 2019 FINDINGS: Brain: There is moderate severity cerebral atrophy with widening of the extra-axial spaces and ventricular dilatation. There are areas of decreased attenuation within the white matter tracts of the supratentorial brain, consistent with microvascular disease changes. Vascular: No hyperdense vessel or unexpected calcification. Skull: Normal. Negative for fracture or focal lesion. Sinuses/Orbits: No acute finding. Other: None. IMPRESSION: 1. Generalized cerebral atrophy. 2. No acute intracranial abnormality. Electronically Signed   By: Virgina Norfolk M.D.   On: 12/05/2019 23:13   DG Chest Portable 1 View  Result Date: 12/05/2019 CLINICAL DATA:  Dyspnea EXAM: PORTABLE CHEST 1 VIEW COMPARISON:  11/09/2019 FINDINGS: The lungs are symmetrically expanded. Interstitial pulmonary edema has improved since prior examination, though has not yet completely resolved. Small right pleural effusion now present. No pneumothorax. Mild to moderate cardiomegaly appears stable. No acute bone abnormality. IMPRESSION: Improving, but persistent cardiogenic failure. Electronically Signed   By: Fidela Salisbury MD   On: 12/05/2019 18:56   DG Chest Port 1 View  Result Date: 11/09/2019 CLINICAL DATA:  Weakness, shortness of breath, wheezing for 1 day EXAM: PORTABLE CHEST 1 VIEW COMPARISON:  01/09/2013 FINDINGS: Single frontal view of the chest demonstrates an enlarged cardiac silhouette. There is central vascular congestion with diffuse interstitial prominence and bilateral infrahilar ground-glass airspace disease. No significant effusion or pneumothorax. IMPRESSION: 1. Findings consistent with mild congestive heart failure. Electronically Signed   By: Randa Ngo M.D.   On: 11/09/2019 19:03    Wt Readings from Last 3  Encounters:  12/06/19 74.2 kg  11/09/19 84.8 kg  09/29/19 65.1 kg    EKG: Atrial fibrillation with a ventricular rate 90  Physical Exam: Chronically ill-appearing African-American male Blood pressure 128/68, pulse (!) 108, temperature (!) 97.5 F (36.4 C), temperature source Oral, resp. rate 20, weight 74.2 kg, SpO2 95 %. Body mass index is 24.86 kg/m. General: Well developed, well nourished, in no acute distress. Head: Normocephalic, atraumatic, sclera non-icteric, no xanthomas, nares are without discharge.  Neck: Negative for carotid bruits. JVD not elevated. Lungs: Clear bilaterally to auscultation without wheezes, rales, or rhonchi. Breathing is unlabored. Heart: Irregularly irregular. No murmurs, rubs, or gallops appreciated. Abdomen: Soft, non-tender, non-distended with normoactive bowel sounds. No hepatomegaly. No rebound/guarding. No obvious abdominal masses. Msk:  Strength and tone appear normal for age. Extremities: No clubbing or cyanosis. No edema.  Distal pedal pulses are 2+ and equal bilaterally. Neuro: Alert and oriented X 3. No facial asymmetry. No focal deficit. Moves all extremities spontaneously. Psych:  Responds to questions appropriately with a normal affect.     Assessment and Plan  84 y.o. male with history of diabetes, hypertension, dementia, HFrEF with an EF of 25 to 30% by echo in 2021, atrial fibrillation treated with rate control and anticoagulated with Pradaxa, CKD who presented to the emergency room with weakness and shortness of breath progressing over the last several weeks.Note to have pulmonary edema on cxr which was improved from one done several weeks earlier. BNP elevated to 1679 bup from 1434 3 weeks earlier. Acute on chronic renal insufficiency with creatinine up to 2.71 with baseline of 1.6-1.7. Recently had his diuretics increased in our office due to concern over worsening lower extremity edema.   CHF-has HFrEF with ef of 25-30% by echo done 5/21.  Moderate MR. BNP is elevated but has evidence of volume depletion based on renal function. Will need careful hydration following renal function, exam electrolytes, BNP. Would hold diuretics for now.  Afib-rate controlled with carvedilol and currently anticoagulated with pradaxa at low dose. May need to change this if renal funciton worsens. BMP pending.   CKD-as per above. Appears to be due to over diuresis. WIll follow. May need to consider lasix drip pending clinical course.    CAD-remain on imdur and carvedilol at current dose. Defer asa due to pradaxa and intolerance.. Not candidate for statin. Elevated troponin not due to acs but due to demand and renal function.   Signed, Teodoro Spray MD 12/06/2019, 9:01 AM Pager: [phone]

## 2019-12-06 NOTE — ED Notes (Signed)
Pt is alert and follows commands, can speak clearly but is oriented to self only. Pt's breathing is tachypnic and irregular with periods of apnea <20 seconds and daughter reports this is unusual for the pt.

## 2019-12-06 NOTE — ED Notes (Signed)
Hospitalist @ [facility], V.O. bolus of IVF. Pt appears congested w/ dusky gray undertone to skin. Pt intermittently has gasping, puffing respirations that change to normal unlabored respirations. Pt unable to state if he is short of breath or having pain w/ respirations. Pt is confused and agitated but unable to clearly identify why. Pt denies pain.

## 2019-12-06 NOTE — Progress Notes (Signed)
Justin Manning daughter [name] called and notified that patient has been moved to Room 250.

## 2019-12-06 NOTE — Progress Notes (Addendum)
Pt has no covid test on file this admission. Notify [facility] NP. Will continue to monitor.  Update 2333:[facility]uma NP ordered Covid test for pt. Covid test came back negative.  Update 0550: Unable to weigh lift was not working. Will notify incoming shift. Will continue to monitor.

## 2019-12-06 NOTE — H&P (Addendum)
History and Physical    Justin Manning VZC:[id number] DOB: 1935-12-19 DOA: 12/05/2019  PCP: Steele Sizer, MD   Patient coming from: home  I have personally briefly reviewed patient's old medical records in [place]  Chief Complaint: weakness, shortness of breath  HPI: Justin Manning is a 84 y.o. male with medical history significant for DM, HTN, CKD 3, dementia, HFrEF, last EF 25 to 30% in May 2021, A. fib on Pradaxa, who presented to the emergency room with generalized weakness, and shortness of breath that has been slowly progressing over the past few weeks.  Patient has been having his diuretic treatment adjusted in the outpatient, seeing his cardiologist as recently as yesterday.  He was switched from Lasix to furosemide and had spironolactone added to his regimen.  He continues to have orthopnea and lower extremity edema.  He denies chest pain, cough fever or chills.  EMS reported O2 sat of 90% but improved to the mid 90s with O2 at 2 L via nasal cannula. ED Course: On arrival, patient afebrile mild tachycardia of 9200, mild tachypnea of 25 with BP 146/98 O2 sat 100% on 2 L.  Blood work significant for BNP of 1679.  Troponin 48/47.  Venous pH 7.4 with PCO2 41.  Chemistries significant for creatinine of 2.71 above baseline of 1.78 and sodium of 152.  BUN 54.  Chest x-ray showed interstitial pulmonary edema improved from 11/09/2019 still not completely resolved with small right pleural effusion.  Head CT with no acute findings.  EKG A. fib controlled rate.  Patient was initially treated with IV Lasix 40 mg.  He was subsequently hydrated with a 500 cc D5 bolus given sodium of 152 and worsening renal function.  Hospitalist consulted for admission. Review of Systems: Unable to obtain due to confusion  Past Medical History:  Diagnosis Date  . Anemia   . CKD (chronic kidney disease), stage III    Dr. Juleen China  . Dementia ([place])   . Diabetes mellitus without complication ([place])   .  GERD (gastroesophageal reflux disease)   . Hyperlipidemia   . Hypertension   . Mitral regurgitation    Mild    Past Surgical History:  Procedure Laterality Date  . ENDARTERECTOMY Left    Carotid  . KNEE SURGERY Right   . PARTIAL COLECTOMY     with Anastomosis  . SHOULDER SURGERY Right      reports that he quit smoking about 31 years ago. His smoking use included cigarettes. He has a 10.00 pack-year smoking history. He has never used smokeless tobacco. He reports previous drug use. Drug: Marijuana. He reports that he does not drink alcohol.  Allergies  Allergen Reactions  . Aspirin     tachycardia    Family History  Problem Relation Age of Onset  . Alcohol abuse Brother   . Diabetes Daughter       Prior to Admission medications   Medication Sig Start Date End Date Taking? Authorizing Provider  bisacodyl (DULCOLAX) 5 MG EC tablet Take 1 tablet (5 mg total) by mouth daily as needed for moderate constipation. 10/06/19  Yes Lorella Nimrod, MD  carvedilol (COREG) 6.25 MG tablet Take 1 tablet (6.25 mg total) by mouth 2 (two) times daily with a meal. 10/06/19  Yes Lorella Nimrod, MD  Cholecalciferol 25 MCG (1000 UT) tablet Take 2,000 Units by mouth 2 (two) times daily.    Yes [provider]  Cyanocobalamin (VITAMIN B 12) 100 MCG LOZG Take 100 mcg  by mouth daily.    Yes [provider]  dabigatran (PRADAXA) 75 MG CAPS capsule Take 75 mg by mouth 2 (two) times daily.    Yes [provider]  glucose blood (FREESTYLE LITE) test strip TEST BLOOD SUGAR THREE TIMES DAILY AS DIRECTED.. 11/11/18  Yes Sowles, Drue Stager, MD  Insulin Pen Needle (B-D ULTRAFINE III SHORT PEN) 31G X 8 MM MISC USE DAILY WITH LANTUS 03/04/19  Yes Sowles, Drue Stager, MD  isosorbide mononitrate (IMDUR) 30 MG 24 hr tablet Take 1 tablet (30 mg total) by mouth daily. 10/06/19  Yes Lorella Nimrod, MD  LANTUS SOLOSTAR 100 UNIT/ML Solostar Pen INJECT 8 UNITS UNDER THE SKIN DAILY Patient taking differently:  Inject 8 Units into the skin at bedtime.  08/26/19  Yes Sowles, Drue Stager, MD  lisinopril (ZESTRIL) 20 MG tablet TAKE 1 TABLET(20 MG) BY MOUTH TWICE DAILY Patient taking differently: Take 20 mg by mouth in the morning and at bedtime.  11/20/19  Yes Sowles, Drue Stager, MD  memantine (NAMENDA) 5 MG tablet TAKE 1 TABLET(5 MG) BY MOUTH TWICE DAILY Patient taking differently: Take 5 mg by mouth 2 (two) times daily.  11/20/19  Yes Sowles, Drue Stager, MD  Multiple Vitamin (MULTIVITAMIN WITH MINERALS) TABS tablet Take 1 tablet by mouth daily. 10/06/19  Yes Lorella Nimrod, MD  ondansetron (ZOFRAN) 4 MG tablet Take 1 tablet (4 mg total) by mouth every 6 (six) hours as needed for nausea. 10/06/19  Yes Lorella Nimrod, MD  polyethylene glycol (MIRALAX / GLYCOLAX) 17 g packet Take 17 g by mouth daily as needed for mild constipation or moderate constipation. 10/06/19  Yes Lorella Nimrod, MD  QUEtiapine (SEROQUEL) 25 MG tablet Take 1 tablet (25 mg total) by mouth at bedtime. 10/06/19  Yes Lorella Nimrod, MD  furosemide (LASIX) 20 MG tablet Take 1 tablet (20 mg total) by mouth daily. Patient not taking: Reported on 12/05/2019 11/09/19 12/09/19  Blake Divine, MD  hydrALAZINE (APRESOLINE) 100 MG tablet Take 1 tablet (100 mg total) by mouth 3 (three) times daily. Patient not taking: Reported on 11/09/2019 10/06/19   Lorella Nimrod, MD  potassium chloride SA (KLOR-CON) 20 MEQ tablet Take 20 mEq by mouth daily.    [provider]  spironolactone (ALDACTONE) 25 MG tablet Take 25 mg by mouth daily.    [provider]  torsemide (DEMADEX) 20 MG tablet Take 40 mg by mouth daily.    [provider]    Physical Exam: Vitals:   12/05/19 2226 12/05/19 2250 12/05/19 2251 12/05/19 2332  BP:      Pulse:  82 58   Resp: 15 (!) 24 19 14   Temp:      TempSrc:      SpO2:  92% 95%      Vitals:   12/05/19 2226 12/05/19 2250 12/05/19 2251 12/05/19 2332  BP:      Pulse:  82 58   Resp: 15 (!) 24 19 14   Temp:        TempSrc:      SpO2:  92% 95%       Constitutional:  Patient confused, restless, not opening eyes when spoken to.  Not answering questions  HEENT:      Head: Normocephalic and atraumatic.         Eyes: PERLA, EOMI, Conjunctivae are normal. Sclera is non-icteric.       Mouth/Throat: Mucous membranes are moist.       Neck: Supple with no signs of meningismus. Cardiovascular:  Irregularly irregular. No murmurs,  gallops, or rubs. 2+ symmetrical distal pulses are present .  JVD slightly elevated. No 2+ LE edema Respiratory: Respiratory effort increased.Lungs sounds decreased bilaterally.  Bibasilar crackles gastrointestinal: Soft, non tender, and non distended with positive bowel sounds. No rebound or guarding. Genitourinary: No CVA tenderness. Musculoskeletal: Nontender with normal range of motion in all extremities. No cyanosis, or erythema of extremities. Neurologic:. Face is symmetric. Moving all extremities. No gross focal neurologic deficits . Skin: Skin is warm, dry.  No rash or ulcers Psychiatric: Unable to assess due to restlessness and confusion  Labs on Admission: I have personally reviewed following labs and imaging studies  CBC: Recent Labs  Lab 12/05/19 1850  WBC 6.8  NEUTROABS 4.6  HGB 11.8*  HCT 37.4*  MCV 95.4  PLT 735   Basic Metabolic Panel: Recent Labs  Lab 12/05/19 1850  NA 152*  K 4.7  CL 117*  CO2 25  GLUCOSE 134*  BUN 54*  CREATININE 2.71*  CALCIUM 9.0   GFR: CrCl cannot be calculated (Unknown ideal weight.). Liver Function Tests: No results for input(s): AST, ALT, ALKPHOS, BILITOT, PROT, ALBUMIN in the last 168 hours. No results for input(s): LIPASE, AMYLASE in the last 168 hours. No results for input(s): AMMONIA in the last 168 hours. Coagulation Profile: No results for input(s): INR, PROTIME in the last 168 hours. Cardiac Enzymes: No results for input(s): CKTOTAL, CKMB, CKMBINDEX, TROPONINI in the last 168 hours. BNP (last 3 results) No  results for input(s): PROBNP in the last 8760 hours. HbA1C: No results for input(s): HGBA1C in the last 72 hours. CBG: No results for input(s): GLUCAP in the last 168 hours. Lipid Profile: No results for input(s): CHOL, HDL, LDLCALC, TRIG, CHOLHDL, LDLDIRECT in the last 72 hours. Thyroid Function Tests: No results for input(s): TSH, T4TOTAL, FREET4, T3FREE, THYROIDAB in the last 72 hours. Anemia Panel: No results for input(s): VITAMINB12, FOLATE, FERRITIN, TIBC, IRON, RETICCTPCT in the last 72 hours. Urine analysis:    Component Value Date/Time   COLORURINE YELLOW (A) 11/09/2019 2051   APPEARANCEUR HAZY (A) 11/09/2019 2051   APPEARANCEUR Hazy 01/09/2013 1604   LABSPEC 1.021 11/09/2019 2051   LABSPEC 1.014 01/09/2013 1604   PHURINE 5.0 11/09/2019 2051   GLUCOSEU NEGATIVE 11/09/2019 2051   GLUCOSEU 50 mg/dL 01/09/2013 [place] 11/09/2019 2051   BILIRUBINUR NEGATIVE 11/09/2019 2051   BILIRUBINUR neg 03/25/2019 1056   BILIRUBINUR Negative 01/09/2013 [place] 11/09/2019 2051   PROTEINUR 100 (A) 11/09/2019 2051   UROBILINOGEN 1.0 03/25/2019 1056   NITRITE NEGATIVE 11/09/2019 2051   LEUKOCYTESUR NEGATIVE 11/09/2019 2051   LEUKOCYTESUR Negative 01/09/2013 1604    Radiological Exams on Admission: CT HEAD WO CONTRAST  Result Date: 12/05/2019 CLINICAL DATA:  Altered mental status and shortness of breath. EXAM: CT HEAD WITHOUT CONTRAST TECHNIQUE: Contiguous axial images were obtained from the base of the skull through the vertex without intravenous contrast. COMPARISON:  Sep 29, 2019 FINDINGS: Brain: There is moderate severity cerebral atrophy with widening of the extra-axial spaces and ventricular dilatation. There are areas of decreased attenuation within the white matter tracts of the supratentorial brain, consistent with microvascular disease changes. Vascular: No hyperdense vessel or unexpected calcification. Skull: Normal. Negative for fracture or focal  lesion. Sinuses/Orbits: No acute finding. Other: None. IMPRESSION: 1. Generalized cerebral atrophy. 2. No acute intracranial abnormality. Electronically Signed   By: Virgina Norfolk M.D.   On: 12/05/2019 23:13   DG Chest Portable 1 View  Result Date:  12/05/2019 CLINICAL DATA:  Dyspnea EXAM: PORTABLE CHEST 1 VIEW COMPARISON:  11/09/2019 FINDINGS: The lungs are symmetrically expanded. Interstitial pulmonary edema has improved since prior examination, though has not yet completely resolved. Small right pleural effusion now present. No pneumothorax. Mild to moderate cardiomegaly appears stable. No acute bone abnormality. IMPRESSION: Improving, but persistent cardiogenic failure. Electronically Signed   By: Fidela Salisbury MD   On: 12/05/2019 18:56    EKG: Independently reviewed. Interpretation : A. fib with controlled rate  Assessment/Plan 84 year old male with history of DM, HTN, CKD 3, dementia, HFrEF, last EF 25 to 30% in May 2021, A. fib on Pradaxa, who presented to the emergency room with generalized weakness, and shortness of breath that has been slowly progressing over the past few weeks.  Work-up consistent with CHF but with concern for relative dehydration related to intensification of diuretic therapy.    Generalized weakness -Suspect related to dehydration from intensification of diuretic therapy as well as chronic medical problems -Gentle IV hydration and treat acute medical problems -Might benefit from physical therapy evaluation    Acute kidney injury superimposed on CKD (HCC)   Hypernatremia -Sodium 152, with creatinine 2.71 up from 1.78 -Suspect related to intensification of diuretic treatment in the treatment of congestive heart failure -Patient received a D5 bolus of 500 cc in the emergency room.  We will continue another slow infusion to complete a total of 1 L -Monitor sodium and creatinine    Hypoxia   Acute on chronic systolic CHF (congestive heart failure) (HCC) -Chest  x-ray suggests improvement in congestive heart failure from a month prior however patient has persistent symptoms with orthopnea, lower extremity edema with BNP over thousand. -Echocardiogram from 09/29/2019 with EF 25 to 30% -Low-dose IV Lasix 20 mg twice daily to replace home torsemide and spironolactone -Daily weights with intake and output monitoring -Continue home beta-blocker, Coreg but hold ACE inhibitor due to worsening renal function -Oxygen supplementation to keep sats over 94%    A-fib (HCC) Chronic anticoagulation -Continue home Pradaxa -Rate controlled with Coreg    Type 2 diabetes mellitus with stage 3 chronic kidney disease (HCC) -Sliding scale insulin.  Continue home Lantus    Essential (primary) hypertension -Continue Coreg.  Lisinopril on hold due to AKI    Dementia without behavioral disturbance (HCC) -Continue Seroquel and Namenda    DVT prophylaxis: Pradaxa Code Status: full code   Family Communication:  None   Disposition Plan: Back to previous home environment Consults called: cardiology Status:At the time of admission, it appears that the appropriate admission status for this patient is INPATIENT. This is judged to be reasonable and necessary in order to provide the required intensity of service to ensure the patient's safety given the presenting symptoms, physical exam findings, and initial radiographic and laboratory data in the context of their  Comorbid conditions.   Patient requires inpatient status due to high intensity of service, high risk for further deterioration and high frequency of surveillance required.   I certify that at the point of admission it is my clinical judgment that the patient will require inpatient hospital care spanning beyond [place] MD [facility]     12/06/2019, 12:09 AM

## 2019-12-07 DIAGNOSIS — R531 Weakness: Secondary | ICD-10-CM | POA: Diagnosis not present

## 2019-12-07 DIAGNOSIS — E870 Hyperosmolality and hypernatremia: Secondary | ICD-10-CM | POA: Diagnosis not present

## 2019-12-07 DIAGNOSIS — Z7901 Long term (current) use of anticoagulants: Secondary | ICD-10-CM

## 2019-12-07 DIAGNOSIS — N179 Acute kidney failure, unspecified: Secondary | ICD-10-CM

## 2019-12-07 LAB — GLUCOSE, CAPILLARY
Glucose-Capillary: 130 mg/dL — ABNORMAL HIGH (ref 70–99)
Glucose-Capillary: 93 mg/dL (ref 70–99)
Glucose-Capillary: 124 mg/dL — ABNORMAL HIGH (ref 70–99)
Glucose-Capillary: 117 mg/dL — ABNORMAL HIGH (ref 70–99)
Glucose-Capillary: 130 mg/dL — ABNORMAL HIGH (ref 70–99)

## 2019-12-07 LAB — SARS CORONAVIRUS 2 BY RT PCR (HOSPITAL ORDER, PERFORMED IN ~~LOC~~ HOSPITAL LAB): SARS Coronavirus 2: NEGATIVE

## 2019-12-07 LAB — BASIC METABOLIC PANEL
Anion gap: 10 (ref 5–15)
BUN: 57 mg/dL — ABNORMAL HIGH (ref 8–23)
CO2: 25 mmol/L (ref 22–32)
Calcium: 9 mg/dL (ref 8.9–10.3)
Chloride: 111 mmol/L (ref 98–111)
Creatinine, Ser: 2.88 mg/dL — ABNORMAL HIGH (ref 0.61–1.24)
GFR calc Af Amer: 23 mL/min — ABNORMAL LOW (ref >60)
GFR calc non Af Amer: 19 mL/min — ABNORMAL LOW (ref >60)
Glucose, Bld: 147 mg/dL — ABNORMAL HIGH (ref 70–99)
Potassium: 4.6 mmol/L (ref 3.5–5.1)
Sodium: 146 mmol/L — ABNORMAL HIGH (ref 135–145)

## 2019-12-07 LAB — BRAIN NATRIURETIC PEPTIDE: B Natriuretic Peptide: 608 pg/mL — ABNORMAL HIGH (ref 0.0–100.0)

## 2019-12-07 MED ORDER — LACTATED RINGERS IV SOLN: INTRAVENOUS | Status: DC

## 2019-12-07 NOTE — Progress Notes (Signed)
Patient Name: Justin Manning Date of Encounter: 12/07/2019  Hospital Problem List     Principal Problem:   Generalized weakness Active Problems:   A-fib ([place])   Type 2 diabetes mellitus with stage 3 chronic kidney disease (HCC)   Essential (primary) hypertension   Acute kidney injury superimposed on CKD (HCC)   Hypernatremia   Dementia with behavioral disturbance (HCC)   Hypoxia   Chronic anticoagulation    Patient Profile     84 y.o. male with history of diabetes, hypertension, dementia, HFrEF with an EF of 25 to 30% by echo in 2021, atrial fibrillation treated with rate control and anticoagulated with Pradaxa, CKD who presented to the emergency room with weakness and shortness of breath progressing over the last several weeks.  He recently was seen in our office.  We made some slight adjuncts months to his diuretics including adding spironolactone and furosemide.  Pulse ox was 90% per EMS improved to 95% on 2 L per nasal cannula.  In the emergency room he was hemodynamically stable.  BNP was 1679.  Troponin was flat in the upper 40s.  He is ruled out for myocardial infarction.  He had acute on chronic renal insufficiency with a creatinine of 2.71 up from 1.8.  Sodium was 152.  Chest x-ray was improved from 1 done in June 2021.  EKG showed A. fib with controlled ventricular response  Subjective   Is very confused today not able to give history.  Inpatient Medications    . carvedilol  6.25 mg Oral BID WC  . dabigatran  75 mg Oral BID  . insulin aspart  0-15 Units Subcutaneous TID WC  . insulin aspart  0-5 Units Subcutaneous QHS  . insulin glargine  8 Units Subcutaneous Daily  . isosorbide mononitrate  30 mg Oral Daily  . memantine  5 mg Oral BID  . QUEtiapine  25 mg Oral QHS  . sodium chloride flush  3 mL Intravenous Q12H    Vital Signs    Vitals:   12/07/19 1100 12/07/19 1150 12/07/19 1153 12/07/19 1548  BP:  (!) 132/92  100/79  Pulse:  68 68 85  Resp: (!) 24 18  18    Temp:  (!) 97.4 F (36.3 C)    TempSrc:  Oral    SpO2:  91% 91% 94%  Weight:      Height:        Intake/Output Summary (Last 24 hours) at 12/07/2019 1905 Last data filed at 12/07/2019 1647 Gross per 24 hour  Intake 1463.05 ml  Output 325 ml  Net 1138.05 ml   Filed Weights   12/06/19 0619  Weight: 74.2 kg    Physical Exam    GEN: Well nourished, well developed, in no acute distress.  HEENT: normal.  Neck: Supple, no JVD, carotid bruits, or masses. Cardiac: Irregular regular rhythm..  2+ bilateral pitting and able Respiratory:  Respirations regular and unlabored, clear to auscultation bilaterally. GI: Soft, nontender, nondistended, BS + x 4. MS: no deformity or atrophy. Skin: warm and dry, no rash. Neuro: Somewhat combative and confused.  Labs    CBC Recent Labs    12/05/19 1850  WBC 6.8  NEUTROABS 4.6  HGB 11.8*  HCT 37.4*  MCV 95.4  PLT 474   Basic Metabolic Panel Recent Labs    12/06/19 0908 12/07/19 0708  NA 153* 146*  K 5.2* 4.6  CL 118* 111  CO2 23 25  GLUCOSE 126* 147*  BUN 55* 57*  CREATININE 2.84* 2.88*  CALCIUM 9.3 9.0   Liver Function Tests No results for input(s): AST, ALT, ALKPHOS, BILITOT, PROT, ALBUMIN in the last 72 hours. No results for input(s): LIPASE, AMYLASE in the last 72 hours. Cardiac Enzymes No results for input(s): CKTOTAL, CKMB, CKMBINDEX, TROPONINI in the last 72 hours. BNP Recent Labs    12/05/19 1850 12/07/19 0511  BNP 1,679.7* 608.0*   D-Dimer No results for input(s): DDIMER in the last 72 hours. Hemoglobin A1C Recent Labs    12/06/19 0908  HGBA1C 7.0*   Fasting Lipid Panel No results for input(s): CHOL, HDL, LDLCALC, TRIG, CHOLHDL, LDLDIRECT in the last 72 hours. Thyroid Function Tests No results for input(s): TSH, T4TOTAL, T3FREE, THYROIDAB in the last 72 hours.  Invalid input(s): FREET3  Telemetry    Atrial fibrillation with controlled ventricular response  ECG    Atrial fibrillation with no  ischemia  Radiology    CT HEAD WO CONTRAST  Result Date: 12/05/2019 CLINICAL DATA:  Altered mental status and shortness of breath. EXAM: CT HEAD WITHOUT CONTRAST TECHNIQUE: Contiguous axial images were obtained from the base of the skull through the vertex without intravenous contrast. COMPARISON:  Sep 29, 2019 FINDINGS: Brain: There is moderate severity cerebral atrophy with widening of the extra-axial spaces and ventricular dilatation. There are areas of decreased attenuation within the white matter tracts of the supratentorial brain, consistent with microvascular disease changes. Vascular: No hyperdense vessel or unexpected calcification. Skull: Normal. Negative for fracture or focal lesion. Sinuses/Orbits: No acute finding. Other: None. IMPRESSION: 1. Generalized cerebral atrophy. 2. No acute intracranial abnormality. Electronically Signed   By: Virgina Norfolk M.D.   On: 12/05/2019 23:13   DG Chest Portable 1 View  Result Date: 12/05/2019 CLINICAL DATA:  Dyspnea EXAM: PORTABLE CHEST 1 VIEW COMPARISON:  11/09/2019 FINDINGS: The lungs are symmetrically expanded. Interstitial pulmonary edema has improved since prior examination, though has not yet completely resolved. Small right pleural effusion now present. No pneumothorax. Mild to moderate cardiomegaly appears stable. No acute bone abnormality. IMPRESSION: Improving, but persistent cardiogenic failure. Electronically Signed   By: Fidela Salisbury MD   On: 12/05/2019 18:56   DG Chest Port 1 View  Result Date: 11/09/2019 CLINICAL DATA:  Weakness, shortness of breath, wheezing for 1 day EXAM: PORTABLE CHEST 1 VIEW COMPARISON:  01/09/2013 FINDINGS: Single frontal view of the chest demonstrates an enlarged cardiac silhouette. There is central vascular congestion with diffuse interstitial prominence and bilateral infrahilar ground-glass airspace disease. No significant effusion or pneumothorax. IMPRESSION: 1. Findings consistent with mild congestive  heart failure. Electronically Signed   By: Randa Ngo M.D.   On: 11/09/2019 19:03    Assessment & Plan    84 y.o. male with history of diabetes, hypertension, dementia, HFrEF with an EF of 25 to 30% by echo in 2021, atrial fibrillation treated with rate control and anticoagulated with Pradaxa, CKD who presented to the emergency room with weakness and shortness of breath progressing over the last several weeks.Note to have pulmonary edema on cxr which was improved from one done several weeks earlier. BNP elevated to 1679 bup from 1434 3 weeks earlier. Acute on chronic renal insufficiency with creatinine up to 2.71 with baseline of 1.6-1.7. Recently had his diuretics increased in our office due to concern over worsening lower extremity edema.   CHF-has HFrEF with ef of 25-30% by echo done 5/21. Moderate MR. BNP is elevated but has evidence of volume depletion based on renal function.  Afib-rate controlled with carvedilol  and currently anticoagulated with pradaxa at low dose. May need to change this if renal funciton worsens. BMP pending.   CKD-as per above. Appears to be due to over diuresis. WIll follow. May need to consider lasix drip pending clinical course.    CAD-remain on imdur and carvedilol at current dose. Defer asa due to pradaxa and intolerance.. Not candidate for statin. Elevated troponin not due to acs but due to demand and renal function.    Signed, Javier Docker Jaquelinne Glendening MD 12/07/2019, 7:05 PM  Pager: [phone]

## 2019-12-07 NOTE — Progress Notes (Signed)
Progress Note    Justin Manning  KGM:[id number] DOB: 25-Apr-1936  DOA: 12/05/2019 PCP: Steele Sizer, MD      Brief Narrative:    Medical records reviewed and are as summarized below:  Justin Manning is a 84 y.o. male  with medical history significant for DM, HTN, CKD 3a, dementia, HFrEF, last EF 25 to 30% in May 2021, A. fib on Pradaxa, who presented to the emergency room with generalized weakness.  He was recently evaluated by his cardiologist as an outpatient and adjustments were made to his diuretics.  Lasix was changed to torsemide and Aldactone was added on 12/05/2019.  He was brought to the hospital because of generalized weakness and shortness of breath.  He was found to have acute kidney injury and hypernatremia.    Assessment/Plan:   Principal Problem:   Generalized weakness Active Problems:   A-fib (HCC)   Type 2 diabetes mellitus with stage 3 chronic kidney disease (HCC)   Essential (primary) hypertension   Acute kidney injury superimposed on CKD (HCC)   Hypernatremia   Dementia with behavioral disturbance (HCC)   Hypoxia   Chronic anticoagulation    Hypernatremia: Sodium level is improving.  Change IV 5% dextrose infusion to IV Ringer's lactate infusion.  Monitor sodium level.  AKI on CKD stage IIIa: BUN and creatinine are trending up.  Change fluids to Ringer's lactate infusion.  Monitor BMP.    Chronic systolic CHF, moderate mitral regurgitation, moderate tricuspid regurgitation: Continue Coreg.  Lisinopril, torsemide and Aldactone have been held.  BMP decreased from [phone].  2D echo in May 2021 showed EF estimated at 25 to 30%, moderate MR, moderate TR.  Acute hypoxemic respiratory failure: Continue oxygen via nasal cannula  Atrial fibrillation: Continue Pradaxa and Coreg  IDDM: Continue Lantus and NovoLog.  Monitor glucose levels closely.  Dementia with behavioral disturbance: Continue Seroquel and Namenda     Body mass index is 24.86  kg/m.  Diet Order            Diet heart healthy/carb modified Room service appropriate? Yes; Fluid consistency: Thin  Diet effective now                       Medications:   . carvedilol  6.25 mg Oral BID WC  . dabigatran  75 mg Oral BID  . insulin aspart  0-15 Units Subcutaneous TID WC  . insulin aspart  0-5 Units Subcutaneous QHS  . insulin glargine  8 Units Subcutaneous Daily  . isosorbide mononitrate  30 mg Oral Daily  . memantine  5 mg Oral BID  . QUEtiapine  25 mg Oral QHS  . sodium chloride flush  3 mL Intravenous Q12H   Continuous Infusions: . sodium chloride    . lactated ringers 75 mL/hr at 12/07/19 1016     Anti-infectives (From admission, onward)   None             Family Communication/Anticipated D/C date and plan/Code Status   DVT prophylaxis:  dabigatran (PRADAXA) capsule 75 mg     Code Status: Full Code  Family Communication: I called his daughter [name]) twice but there was no response.  I left a voice message for her to call back if she has any questions. Disposition Plan:    Status is: Inpatient  Remains inpatient appropriate because:IV treatments appropriate due to intensity of illness or inability to take PO and Inpatient level of care appropriate due to severity  of illness   Dispo: The patient is from: Home              Anticipated d/c is to: Home              Anticipated d/c date is: 3 days              Patient currently is not medically stable to d/c.           Subjective:   Overnight events noted.  Patient is more confused.  He is unable to provide any history.  Objective:    Vitals:   12/07/19 0900 12/07/19 1100 12/07/19 1150 12/07/19 1153  BP: 100/76  (!) 132/92   Pulse: (!) 118  (!) 116 68  Resp: 22 (!) 24 18   Temp: 98.1 F (36.7 C)  (!) 97.4 F (36.3 C)   TempSrc: Axillary  Oral   SpO2:   (!) 63% 91%  Weight:      Height:       No data found.   Intake/Output Summary (Last 24 hours) at  12/07/2019 1158 Last data filed at 12/07/2019 0647 Gross per 24 hour  Intake 1197.64 ml  Output 100 ml  Net 1097.64 ml   Filed Weights   12/06/19 0619  Weight: 74.2 kg    Exam:  GEN: NAD, mittens in place SKIN: No rash EYES: No pallor or icterus ENT: MMM CV: RRR PULM: CTA B ABD: soft, ND, NT, +BS CNS: Alert but disoriented.  Non focal EXT: No edema or tenderness   Data Reviewed:   I have personally reviewed following labs and imaging studies:  Labs: Labs show the following:   Basic Metabolic Panel: Recent Labs  Lab 12/05/19 1850 12/05/19 1850 12/06/19 0908 12/07/19 0708  NA 152*  --  153* 146*  K 4.7   < > 5.2* 4.6  CL 117*  --  118* 111  CO2 25  --  23 25  GLUCOSE 134*  --  126* 147*  BUN 54*  --  55* 57*  CREATININE 2.71*  --  2.84* 2.88*  CALCIUM 9.0  --  9.3 9.0   < > = values in this interval not displayed.   GFR Estimated Creatinine Clearance: 19.1 mL/min (A) (by C-G formula based on SCr of 2.88 mg/dL (H)). Liver Function Tests: No results for input(s): AST, ALT, ALKPHOS, BILITOT, PROT, ALBUMIN in the last 168 hours. No results for input(s): LIPASE, AMYLASE in the last 168 hours. No results for input(s): AMMONIA in the last 168 hours. Coagulation profile No results for input(s): INR, PROTIME in the last 168 hours.  CBC: Recent Labs  Lab 12/05/19 1850  WBC 6.8  NEUTROABS 4.6  HGB 11.8*  HCT 37.4*  MCV 95.4  PLT 187   Cardiac Enzymes: No results for input(s): CKTOTAL, CKMB, CKMBINDEX, TROPONINI in the last 168 hours. BNP (last 3 results) No results for input(s): PROBNP in the last 8760 hours. CBG: Recent Labs  Lab 12/06/19 1210 12/06/19 1703 12/06/19 2126 12/07/19 0649 12/07/19 0959  GLUCAP 87 129* 158* 130* 93   D-Dimer: No results for input(s): DDIMER in the last 72 hours. Hgb A1c: Recent Labs    12/06/19 0908  HGBA1C 7.0*   Lipid Profile: No results for input(s): CHOL, HDL, LDLCALC, TRIG, CHOLHDL, LDLDIRECT in the last  72 hours. Thyroid function studies: No results for input(s): TSH, T4TOTAL, T3FREE, THYROIDAB in the last 72 hours.  Invalid input(s): FREET3 Anemia work up: No results for  input(s): VITAMINB12, FOLATE, FERRITIN, TIBC, IRON, RETICCTPCT in the last 72 hours. Sepsis Labs: Recent Labs  Lab 12/05/19 1850  WBC 6.8    Microbiology Recent Results (from the past 240 hour(s))  MRSA PCR Screening     Status: None   Collection Time: 12/06/19  6:16 AM   Specimen: Nasopharyngeal  Result Value Ref Range Status   MRSA by PCR NEGATIVE NEGATIVE Final    Comment:        The GeneXpert MRSA Assay (FDA approved for NASAL specimens only), is one component of a comprehensive MRSA colonization surveillance program. It is not intended to diagnose MRSA infection nor to guide or monitor treatment for MRSA infections. Performed at [facility], [address] [place], [place] [address]   SARS Coronavirus 2 by RT PCR (hospital order, performed in [facility] hospital lab) Nasopharyngeal Nasopharyngeal Swab     Status: None   Collection Time: 12/06/19 11:44 PM   Specimen: Nasopharyngeal Swab  Result Value Ref Range Status   SARS Coronavirus 2 NEGATIVE NEGATIVE Final    Comment: (NOTE) SARS-CoV-2 target nucleic acids are NOT DETECTED.  The SARS-CoV-2 RNA is generally detectable in upper and lower respiratory specimens during the acute phase of infection. The lowest concentration of SARS-CoV-2 viral copies this assay can detect is 250 copies / mL. A negative result does not preclude SARS-CoV-2 infection and should not be used as the sole basis for treatment or other patient management decisions.  A negative result may occur with improper specimen collection / handling, submission of specimen other than nasopharyngeal swab, presence of viral mutation(s) within the areas targeted by this assay, and inadequate number of viral copies (<250 copies / mL). A negative result must be combined with  clinical observations, patient history, and epidemiological information.  Fact Sheet for Patients:   StrictlyIdeas.no  Fact Sheet for Healthcare Providers: BankingDealers.co.za  This test is not yet approved or  cleared by the [place] FDA and has been authorized for detection and/or diagnosis of SARS-CoV-2 by FDA under an Emergency Use Authorization (EUA).  This EUA will remain in effect (meaning this test can be used) for the duration of the COVID-19 declaration under Section 564(b)(1) of the Act, 21 U.S.C. section 360bbb-3(b)(1), unless the authorization is terminated or revoked sooner.  Performed at [facility], [address] [place], [place] [address]     Procedures and diagnostic studies:  CT HEAD WO CONTRAST  Result Date: 12/05/2019 CLINICAL DATA:  Altered mental status and shortness of breath. EXAM: CT HEAD WITHOUT CONTRAST TECHNIQUE: Contiguous axial images were obtained from the base of the skull through the vertex without intravenous contrast. COMPARISON:  Sep 29, 2019 FINDINGS: Brain: There is moderate severity cerebral atrophy with widening of the extra-axial spaces and ventricular dilatation. There are areas of decreased attenuation within the white matter tracts of the supratentorial brain, consistent with microvascular disease changes. Vascular: No hyperdense vessel or unexpected calcification. Skull: Normal. Negative for fracture or focal lesion. Sinuses/Orbits: No acute finding. Other: None. IMPRESSION: 1. Generalized cerebral atrophy. 2. No acute intracranial abnormality. Electronically Signed   By: Virgina Norfolk M.D.   On: 12/05/2019 23:13   DG Chest Portable 1 View  Result Date: 12/05/2019 CLINICAL DATA:  Dyspnea EXAM: PORTABLE CHEST 1 VIEW COMPARISON:  11/09/2019 FINDINGS: The lungs are symmetrically expanded. Interstitial pulmonary edema has improved since prior examination, though has not yet  completely resolved. Small right pleural effusion now present. No pneumothorax. Mild to moderate cardiomegaly appears  stable. No acute bone abnormality. IMPRESSION: Improving, but persistent cardiogenic failure. Electronically Signed   By: Fidela Salisbury MD   On: 12/05/2019 18:56               LOS: 2 days   [facility]     12/07/2019, 11:58 AM

## 2019-12-08 ENCOUNTER — Inpatient Hospital Stay: Payer: Medicare Other

## 2019-12-08 DIAGNOSIS — Z7901 Long term (current) use of anticoagulants: Secondary | ICD-10-CM | POA: Diagnosis not present

## 2019-12-08 DIAGNOSIS — R531 Weakness: Secondary | ICD-10-CM | POA: Diagnosis not present

## 2019-12-08 DIAGNOSIS — R0902 Hypoxemia: Secondary | ICD-10-CM

## 2019-12-08 DIAGNOSIS — N179 Acute kidney failure, unspecified: Secondary | ICD-10-CM | POA: Diagnosis not present

## 2019-12-08 DIAGNOSIS — E870 Hyperosmolality and hypernatremia: Secondary | ICD-10-CM | POA: Diagnosis not present

## 2019-12-08 LAB — BASIC METABOLIC PANEL
Anion gap: 10 (ref 5–15)
BUN: 55 mg/dL — ABNORMAL HIGH (ref 8–23)
CO2: 25 mmol/L (ref 22–32)
Calcium: 8.6 mg/dL — ABNORMAL LOW (ref 8.9–10.3)
Chloride: 114 mmol/L — ABNORMAL HIGH (ref 98–111)
Creatinine, Ser: 2.88 mg/dL — ABNORMAL HIGH (ref 0.61–1.24)
GFR calc Af Amer: 23 mL/min — ABNORMAL LOW (ref >60)
GFR calc non Af Amer: 19 mL/min — ABNORMAL LOW (ref >60)
Glucose, Bld: 137 mg/dL — ABNORMAL HIGH (ref 70–99)
Potassium: 4.4 mmol/L (ref 3.5–5.1)
Sodium: 149 mmol/L — ABNORMAL HIGH (ref 135–145)

## 2019-12-08 LAB — GLUCOSE, CAPILLARY
Glucose-Capillary: 107 mg/dL — ABNORMAL HIGH (ref 70–99)
Glucose-Capillary: 131 mg/dL — ABNORMAL HIGH (ref 70–99)
Glucose-Capillary: 94 mg/dL (ref 70–99)
Glucose-Capillary: 142 mg/dL — ABNORMAL HIGH (ref 70–99)
Glucose-Capillary: 152 mg/dL — ABNORMAL HIGH (ref 70–99)

## 2019-12-08 LAB — CBC
HCT: 37 % — ABNORMAL LOW (ref 39.0–52.0)
Hemoglobin: 11.9 g/dL — ABNORMAL LOW (ref 13.0–17.0)
MCH: 30.4 pg (ref 26.0–34.0)
MCHC: 32.2 g/dL (ref 30.0–36.0)
MCV: 94.4 fL (ref 80.0–100.0)
Platelets: 195 10*3/uL (ref 150–400)
RBC: 3.92 MIL/uL — ABNORMAL LOW (ref 4.22–5.81)
RDW: 16.4 % — ABNORMAL HIGH (ref 11.5–15.5)
WBC: 7.1 10*3/uL (ref 4.0–10.5)
nRBC: 0.4 % — ABNORMAL HIGH (ref 0.0–0.2)

## 2019-12-08 LAB — PHOSPHORUS: Phosphorus: 6 mg/dL — ABNORMAL HIGH (ref 2.5–4.6)

## 2019-12-08 LAB — BLOOD GAS, ARTERIAL
Acid-base deficit: 3.1 mmol/L — ABNORMAL HIGH (ref 0.0–2.0)
Allens test (pass/fail): POSITIVE — AB
Bicarbonate: 21.2 mmol/L (ref 20.0–28.0)
FIO2: 0.36
O2 Saturation: 98.2 %
Patient temperature: 37
pCO2 arterial: 35 mmHg (ref 32.0–48.0)
pH, Arterial: 7.39 (ref 7.350–7.450)
pO2, Arterial: 108 mmHg (ref 83.0–108.0)

## 2019-12-08 LAB — MAGNESIUM: Magnesium: 2.7 mg/dL — ABNORMAL HIGH (ref 1.7–2.4)

## 2019-12-08 MED ORDER — DEXTROSE 5 % IV SOLN: INTRAVENOUS | Status: DC

## 2019-12-08 MED ORDER — STARCH (THICKENING) PO POWD
ORAL | Status: DC | PRN
  Filled 2019-12-08: qty 227

## 2019-12-08 MED ORDER — DEXTROSE 5 % IV SOLN: INTRAVENOUS | Status: AC

## 2019-12-08 MED ORDER — IPRATROPIUM-ALBUTEROL 0.5-2.5 (3) MG/3ML IN SOLN
3.0000 mL | Freq: Four times a day (QID) | RESPIRATORY_TRACT | Status: DC | PRN
  Administered 2019-12-08: 3 mL via RESPIRATORY_TRACT
  Filled 2019-12-08: qty 3

## 2019-12-08 NOTE — Progress Notes (Signed)
I called respiratory to come assess patient because of his breathing. Called and spoke to the doctor about his apneic breathing episodes and increased wheezing. Orders being put in. Pt still arousable and answering questions occasionally but then falls back to sleep. Will continue to monitor and implement orders.

## 2019-12-08 NOTE — Progress Notes (Signed)
Patient Name: Justin Manning Date of Encounter: 12/08/2019  Hospital Problem List     Principal Problem:   Generalized weakness Active Problems:   A-fib ([place])   Type 2 diabetes mellitus with stage 3 chronic kidney disease ([place])   Essential (primary) hypertension   Acute kidney injury superimposed on CKD (HCC)   Hypernatremia   Dementia with behavioral disturbance (HCC)   Hypoxia   Chronic anticoagulation    Patient Profile     84 y.o.malewith history ofdiabetes, hypertension, dementia, HFrEF with an EF of 25 to 30% by echo in 2021, atrial fibrillation treated with rate control and anticoagulated with Pradaxa, CKD who presented to the emergency room with weakness and shortness of breath progressing over the last several weeks. He recently was seen in our office. We made some slight adjuncts months to his diuretics including adding spironolactone and furosemide. Pulse ox was 90% per EMS improved to 95% on 2 L per nasal cannula. In the emergency room he was hemodynamically stable. BNP was 1679. Troponin was flat in the upper 40s. He is ruled out for myocardial infarction. He had acute on chronic renal insufficiency with a creatinine of 2.71 up from 1.8. Sodium was 152. Chest x-ray was improved from 1 done in June 2021. EKG showed A. fib with controlled ventricular response  Subjective   Still very confused this morning.  Unable to give history.  Inpatient Medications    . carvedilol  6.25 mg Oral BID WC  . dabigatran  75 mg Oral BID  . insulin aspart  0-15 Units Subcutaneous TID WC  . insulin aspart  0-5 Units Subcutaneous QHS  . insulin glargine  8 Units Subcutaneous Daily  . isosorbide mononitrate  30 mg Oral Daily  . memantine  5 mg Oral BID  . QUEtiapine  25 mg Oral QHS  . sodium chloride flush  3 mL Intravenous Q12H    Vital Signs    Vitals:   12/08/19 0803 12/08/19 0805 12/08/19 1149 12/08/19 1300  BP: (!) 148/82  (!) 111/94   Pulse: 94  (!) 123    Resp: 17  18 (!) 25  Temp: 98.4 F (36.9 C)     TempSrc:      SpO2: (!) 81% 98% 91%   Weight:      Height:        Intake/Output Summary (Last 24 hours) at 12/08/2019 1403 Last data filed at 12/08/2019 1022 Gross per 24 hour  Intake 1060.41 ml  Output 225 ml  Net 835.41 ml   Filed Weights   12/06/19 0619  Weight: 74.2 kg    Physical Exam    GEN: Well nourished, well developed, in no acute distress.  HEENT: normal.  Neck: Supple, no JVD, carotid bruits, or masses. Cardiac: Irregular regular rhythm with 2-3+ pitting edema in his lower extremities ally.  Respiratory:  Respirations regular and unlabored, clear to auscultation bilaterally. GI: Soft, nontender, nondistended, BS + x 4. MS: no deformity or atrophy. Skin: warm and dry, no rash. Neuro: Confused and somewhat combative  Labs    CBC Recent Labs    12/05/19 1850 12/08/19 0544  WBC 6.8 7.1  NEUTROABS 4.6  --   HGB 11.8* 11.9*  HCT 37.4* 37.0*  MCV 95.4 94.4  PLT 187 433   Basic Metabolic Panel Recent Labs    12/07/19 0708 12/08/19 0544  NA 146* 149*  K 4.6 4.4  CL 111 114*  CO2 25 25  GLUCOSE 147* 137*  BUN 57* 55*  CREATININE 2.88* 2.88*  CALCIUM 9.0 8.6*  MG  --  2.7*  PHOS  --  6.0*   Liver Function Tests No results for input(s): AST, ALT, ALKPHOS, BILITOT, PROT, ALBUMIN in the last 72 hours. No results for input(s): LIPASE, AMYLASE in the last 72 hours. Cardiac Enzymes No results for input(s): CKTOTAL, CKMB, CKMBINDEX, TROPONINI in the last 72 hours. BNP Recent Labs    12/05/19 1850 12/07/19 0511  BNP 1,679.7* 608.0*   D-Dimer No results for input(s): DDIMER in the last 72 hours. Hemoglobin A1C Recent Labs    12/06/19 0908  HGBA1C 7.0*   Fasting Lipid Panel No results for input(s): CHOL, HDL, LDLCALC, TRIG, CHOLHDL, LDLDIRECT in the last 72 hours. Thyroid Function Tests No results for input(s): TSH, T4TOTAL, T3FREE, THYROIDAB in the last 72 hours.  Invalid input(s):  FREET3  Telemetry    Atrial fibrillation with controlled ventricular response  ECG    A. fib  Radiology    CT HEAD WO CONTRAST  Result Date: 12/05/2019 CLINICAL DATA:  Altered mental status and shortness of breath. EXAM: CT HEAD WITHOUT CONTRAST TECHNIQUE: Contiguous axial images were obtained from the base of the skull through the vertex without intravenous contrast. COMPARISON:  Sep 29, 2019 FINDINGS: Brain: There is moderate severity cerebral atrophy with widening of the extra-axial spaces and ventricular dilatation. There are areas of decreased attenuation within the white matter tracts of the supratentorial brain, consistent with microvascular disease changes. Vascular: No hyperdense vessel or unexpected calcification. Skull: Normal. Negative for fracture or focal lesion. Sinuses/Orbits: No acute finding. Other: None. IMPRESSION: 1. Generalized cerebral atrophy. 2. No acute intracranial abnormality. Electronically Signed   By: Virgina Norfolk M.D.   On: 12/05/2019 23:13   US RENAL  Result Date: 12/08/2019 CLINICAL DATA:  Acute kidney injury. EXAM: RENAL / URINARY TRACT ULTRASOUND COMPLETE COMPARISON:  01/09/2013 FINDINGS: Right Kidney: Renal measurements: 8.9 x 4.3 x 4.5 cm = volume: 89 mL . Echogenicity within normal limits. No mass or hydronephrosis visualized. Left Kidney: Renal measurements: 8.8 x 5.7 x 3.9 cm = volume: 100 mL. 11 mm cortical cyst noted interpolar region. No hydronephrosis. Bladder: Bladder is incompletely distended but bladder wall appears thickened and somewhat irregular despite the decompressed state. Other: Bilateral pleural effusions noted. Small volume intraperitoneal free fluid. IMPRESSION: 1. No hydronephrosis. 2. 11 mm left renal cortical cyst. Electronically Signed   By: Misty Stanley M.D.   On: 12/08/2019 10:46   DG Chest Port 1 View  Result Date: 12/08/2019 CLINICAL DATA:  Weakness, shortness of breath, wheezing EXAM: PORTABLE CHEST 1 VIEW COMPARISON:   12/05/2019 FINDINGS: Cardiomegaly. Unchanged small, layering bilateral pleural effusions. No focal airspace opacity. IMPRESSION: 1.  Cardiomegaly. 2. Unchanged small, layering bilateral pleural effusions. No focal airspace opacity. Electronically Signed   By: Eddie Candle M.D.   On: 12/08/2019 09:25   DG Chest Portable 1 View  Result Date: 12/05/2019 CLINICAL DATA:  Dyspnea EXAM: PORTABLE CHEST 1 VIEW COMPARISON:  11/09/2019 FINDINGS: The lungs are symmetrically expanded. Interstitial pulmonary edema has improved since prior examination, though has not yet completely resolved. Small right pleural effusion now present. No pneumothorax. Mild to moderate cardiomegaly appears stable. No acute bone abnormality. IMPRESSION: Improving, but persistent cardiogenic failure. Electronically Signed   By: Fidela Salisbury MD   On: 12/05/2019 18:56   DG Chest Port 1 View  Result Date: 11/09/2019 CLINICAL DATA:  Weakness, shortness of breath, wheezing for 1 day EXAM: PORTABLE CHEST 1  VIEW COMPARISON:  01/09/2013 FINDINGS: Single frontal view of the chest demonstrates an enlarged cardiac silhouette. There is central vascular congestion with diffuse interstitial prominence and bilateral infrahilar ground-glass airspace disease. No significant effusion or pneumothorax. IMPRESSION: 1. Findings consistent with mild congestive heart failure. Electronically Signed   By: Randa Ngo M.D.   On: 11/09/2019 19:03    Assessment & Plan    84 y.o.malewith history ofdiabetes, hypertension, dementia, HFrEF with an EF of 25 to 30% by echo in 2021, atrial fibrillation treated with rate control and anticoagulated with Pradaxa, CKD who presented to the emergency room with weakness and shortness of breath progressing over the last several weeks.Note to have pulmonary edema on cxr which was improved from one done several weeks earlier. BNP elevated to 1679 bup from 1434 3 weeks earlier. Acute on chronic renal insufficiency with  creatinine up to 2.71 with baseline of 1.6-1.7. Recently had his diuretics increased in our office due to concern over worsening lower extremity edema.   CHF-has HFrEF with ef of 25-30% by echo done 5/21. Moderate MR.  Afib-rate controlled with carvedilol and currently anticoagulated with pradaxa at low dose.  GFR is greater than 15.  We will continue with Pradaxa at 75 mg.  CKD-as per above.  Renal function flat today with a creatinine of 2.88.  Sodium 149.  CAD-remain on imdur and carvedilol at current dose. Defer asa due to pradaxa and intolerance.. Not candidate for statin. Elevated troponin not due to acs but due to demand and renal function.   Signed, Javier Docker Dameisha Tschida MD 12/08/2019, 2:03 PM  Pager: [phone]

## 2019-12-08 NOTE — Care Management Important Message (Signed)
Important Message  Patient Details  Name: Justin Manning MRN: [id number] Date of Birth: [birth year]   Medicare Important Message Given:  Yes     [name] 12/08/2019, 11:36 AM

## 2019-12-08 NOTE — Progress Notes (Signed)
   12/08/19 1149  Assess: MEWS Score  BP (!) 111/94  Pulse Rate (!) 123  Resp 18  SpO2 91 %  O2 Device Nasal Cannula  O2 Flow Rate (L/min) 3.5 L/min  Assess: MEWS Score  MEWS Temp 0  MEWS Systolic 0  MEWS Pulse 2  MEWS RR 0  MEWS LOC 0  MEWS Score 2  MEWS Score Color Yellow  Assess: if the MEWS score is Yellow or Red  Were vital signs taken at a resting state? Yes  Focused Assessment No change from prior assessment  Early Detection of Sepsis Score *See Row Information* Medium  MEWS guidelines implemented *See Row Information* Yes  Treat  MEWS Interventions Administered scheduled meds/treatments  Pain Scale Faces  Pain Score 0  Faces Pain Scale 0  Take Vital Signs  Increase Vital Sign Frequency  Yellow: Q 2hr X 2 then Q 4hr X 2, if remains yellow, continue Q 4hrs  Escalate  MEWS: Escalate Yellow: discuss with charge nurse/RN and consider discussing with provider and RRT  Notify: Charge Nurse/RN  Name of Charge Nurse/RN Notified Colletta [place]  Date Charge Nurse/RN Notified 12/08/19  Time Charge Nurse/RN Notified 1339  Notify: Provider  Provider Name/Title [facility]  Date Provider Notified 12/08/19  Time Provider Notified 1200  Notification Type Call  Notification Reason  (Heart rate staying sustained in 110's)  Response No new orders  Date of Provider Response 12/08/19  Time of Provider Response 1200  Document  Patient Outcome Other (Comment) (Will continue to monitor)  Progress note created (see row info) Yes

## 2019-12-08 NOTE — Progress Notes (Signed)
[facility], [place] 12/08/19  Subjective:   LOS: 3 07/25 0701 - 07/26 0700 In: 1060.4 [P.O.:120; I.V.:940.4] Out: 225 [Urine:225]  Patient known to our practice from outpatient follow-up.Consult for acute kidney injury.  Baseline renal function of 1.53/GFR 42-48 from October 14, 2019.  Serum creatinine at present increased to 2.88. Patient presented to the ER via EMS from [facility] for shortness of breath.  Was found to be hypoxic on room air therefore required oxygen supplementation by nasal cannula.  He has baseline dementia therefore not able to provide meaningful information/history.  He has CHF with EF of 25 to 30% and atrial fibrillation. At present, he is able to eat pured food with assistance.  He makes grunting noises which are likely upper airway sounds.   Objective:  Vital signs in last 24 hours:  Temp:  [97.7 F (36.5 C)-98.4 F (36.9 C)] 98.4 F (36.9 C) (07/26 0803) Pulse Rate:  [76-123] 123 (07/26 1149) Resp:  [17-25] 25 (07/26 1300) BP: (81-148)/(67-95) 111/94 (07/26 1149) SpO2:  [81 %-98 %] 91 % (07/26 1149)  Weight change:  Filed Weights   12/06/19 0619  Weight: 74.2 kg    Intake/Output:    Intake/Output Summary (Last 24 hours) at 12/08/2019 1345 Last data filed at 12/08/2019 1022 Gross per 24 hour  Intake 1060.41 ml  Output 225 ml  Net 835.41 ml     Physical Exam: General:  Frail, elderly gentleman, laying in bed  HEENT  moist oral mucous membranes  Pulm/lungs  normal breathing effort  CVS/Heart  irregular rhythm,  Abdomen:   Soft, nontender  Extremities:  + Dependent edema  Neurologic:  Oriented to self, able to answer simple questions  Skin:  Warm, dry          Basic Metabolic Panel:  Recent Labs  Lab 12/05/19 1850 12/05/19 1850 12/06/19 0908 12/07/19 0708 12/08/19 0544  NA 152*  --  153* 146* 149*  K 4.7  --  5.2* 4.6 4.4  CL 117*  --  118* 111 114*  CO2 25  --  23 25 25   GLUCOSE 134*  --  126*  147* 137*  BUN 54*  --  55* 57* 55*  CREATININE 2.71*  --  2.84* 2.88* 2.88*  CALCIUM 9.0   < > 9.3 9.0 8.6*  MG  --   --   --   --  2.7*  PHOS  --   --   --   --  6.0*   < > = values in this interval not displayed.     CBC: Recent Labs  Lab 12/05/19 1850 12/08/19 0544  WBC 6.8 7.1  NEUTROABS 4.6  --   HGB 11.8* 11.9*  HCT 37.4* 37.0*  MCV 95.4 94.4  PLT 187 195     No results found for: HEPBSAG, HEPBSAB, HEPBIGM    Microbiology:  Recent Results (from the past 240 hour(s))  MRSA PCR Screening     Status: None   Collection Time: 12/06/19  6:16 AM   Specimen: Nasopharyngeal  Result Value Ref Range Status   MRSA by PCR NEGATIVE NEGATIVE Final    Comment:        The GeneXpert MRSA Assay (FDA approved for NASAL specimens only), is one component of a comprehensive MRSA colonization surveillance program. It is not intended to diagnose MRSA infection nor to guide or monitor treatment for MRSA infections. Performed at [facility], [address] [place], [place] [address]  SARS Coronavirus 2 by RT PCR (hospital order, performed in [facility] hospital lab) Nasopharyngeal Nasopharyngeal Swab     Status: None   Collection Time: 12/06/19 11:44 PM   Specimen: Nasopharyngeal Swab  Result Value Ref Range Status   SARS Coronavirus 2 NEGATIVE NEGATIVE Final    Comment: (NOTE) SARS-CoV-2 target nucleic acids are NOT DETECTED.  The SARS-CoV-2 RNA is generally detectable in upper and lower respiratory specimens during the acute phase of infection. The lowest concentration of SARS-CoV-2 viral copies this assay can detect is 250 copies / mL. A negative result does not preclude SARS-CoV-2 infection and should not be used as the sole basis for treatment or other patient management decisions.  A negative result may occur with improper specimen collection / handling, submission of specimen other than nasopharyngeal swab, presence of viral mutation(s) within the areas  targeted by this assay, and inadequate number of viral copies (<250 copies / mL). A negative result must be combined with clinical observations, patient history, and epidemiological information.  Fact Sheet for Patients:   StrictlyIdeas.no  Fact Sheet for Healthcare Providers: BankingDealers.co.za  This test is not yet approved or  cleared by the [place] FDA and has been authorized for detection and/or diagnosis of SARS-CoV-2 by FDA under an Emergency Use Authorization (EUA).  This EUA will remain in effect (meaning this test can be used) for the duration of the COVID-19 declaration under Section 564(b)(1) of the Act, 21 U.S.C. section 360bbb-3(b)(1), unless the authorization is terminated or revoked sooner.  Performed at [facility], [address] [place], [place] [address]     Coagulation Studies: No results for input(s): LABPROT, INR in the last 72 hours.  Urinalysis: No results for input(s): COLORURINE, LABSPEC, PHURINE, GLUCOSEU, HGBUR, BILIRUBINUR, KETONESUR, PROTEINUR, UROBILINOGEN, NITRITE, LEUKOCYTESUR in the last 72 hours.  Invalid input(s): APPERANCEUR    Imaging: US RENAL  Result Date: 12/08/2019 CLINICAL DATA:  Acute kidney injury. EXAM: RENAL / URINARY TRACT ULTRASOUND COMPLETE COMPARISON:  01/09/2013 FINDINGS: Right Kidney: Renal measurements: 8.9 x 4.3 x 4.5 cm = volume: 89 mL . Echogenicity within normal limits. No mass or hydronephrosis visualized. Left Kidney: Renal measurements: 8.8 x 5.7 x 3.9 cm = volume: 100 mL. 11 mm cortical cyst noted interpolar region. No hydronephrosis. Bladder: Bladder is incompletely distended but bladder wall appears thickened and somewhat irregular despite the decompressed state. Other: Bilateral pleural effusions noted. Small volume intraperitoneal free fluid. IMPRESSION: 1. No hydronephrosis. 2. 11 mm left renal cortical cyst. Electronically Signed   By: Misty Stanley  M.D.   On: 12/08/2019 10:46   DG Chest Port 1 View  Result Date: 12/08/2019 CLINICAL DATA:  Weakness, shortness of breath, wheezing EXAM: PORTABLE CHEST 1 VIEW COMPARISON:  12/05/2019 FINDINGS: Cardiomegaly. Unchanged small, layering bilateral pleural effusions. No focal airspace opacity. IMPRESSION: 1.  Cardiomegaly. 2. Unchanged small, layering bilateral pleural effusions. No focal airspace opacity. Electronically Signed   By: Eddie Candle M.D.   On: 12/08/2019 09:25     Medications:   . sodium chloride    . dextrose 50 mL/hr at 12/08/19 1255   . carvedilol  6.25 mg Oral BID WC  . dabigatran  75 mg Oral BID  . insulin aspart  0-15 Units Subcutaneous TID WC  . insulin aspart  0-5 Units Subcutaneous QHS  . insulin glargine  8 Units Subcutaneous Daily  . isosorbide mononitrate  30 mg Oral Daily  . memantine  5 mg Oral BID  . QUEtiapine  25  mg Oral QHS  . sodium chloride flush  3 mL Intravenous Q12H   sodium chloride, acetaminophen, food thickener, ipratropium-albuterol, ondansetron (ZOFRAN) IV, sodium chloride flush  Assessment/ Plan:  84 y.o. male with hypertension, atrial fibrillation, diabetes mellitus type 2 insulin requiring, BPH , chronic kidney disease stage IIIB with proteinuria.  Presented to ER via [facility] for shortness of breath  Principal Problem:   Generalized weakness Active Problems:   A-fib (HCC)   Type 2 diabetes mellitus with stage 3 chronic kidney disease (HCC)   Essential (primary) hypertension   Acute kidney injury superimposed on CKD (HCC)   Hypernatremia   Dementia with behavioral disturbance (HCC)   Hypoxia   Chronic anticoagulation  #Hypernatremia Needs free water supplementation. Oral supplementation is being done with Thick-It but requires assistance with intake Also getting IV D5W at 50 cc/h Consider discontinuing IV fluid supplementation to avoid volume overload in the setting of AKI  #.  AKI on CKD st stage IIIb.  Baseline  creatinine 1.53, GFR 42-48 from October 14, 2019 Recent Labs    12/05/19 1850 12/06/19 0908 12/07/19 0708 12/08/19 0544  CREATININE 2.71* 2.84* 2.88* 2.88*  AKI likely secondary to hemodynamic instability, leading to ATN Patient received multiple doses of IV Lasix injection this admission. Renal ultrasound from July 26 shows bilateral pleural effusion, no hydronephrosis, left renal cortical cyst  #. Anemia of CKD  Lab Results  Component Value Date   HGB 11.9 (L) 12/08/2019     #. Diabetes type 2 with CKD Hemoglobin A1C (no units)  Date Value  07/09/2017 6.2   HbA1c, POC (controlled diabetic range) (%)  Date Value  08/23/2018 6.4   Hgb A1c MFr Bld (%)  Date Value  12/06/2019 7.0 (H)  Acceptable control for age and general condition   LOS: [place] 7/26/20211:45 PM  [place], [place]

## 2019-12-08 NOTE — Progress Notes (Addendum)
Lift is not working on low bed, unable to weight pt. Will notify incoming shift. Will continue to monitor.

## 2019-12-08 NOTE — Progress Notes (Addendum)
Progress Note    Justin Manning  TLX:[id number] DOB: May 28, 1935  DOA: 12/05/2019 PCP: Steele Sizer, MD      Brief Narrative:    Medical records reviewed and are as summarized below:  Justin Manning is a 84 y.o. male  with medical history significant for DM, HTN, CKD 3a, dementia, HFrEF, last EF 25 to 30% in May 2021, A. fib on Pradaxa, who presented to the emergency room with generalized weakness.  He was recently evaluated by his cardiologist as an outpatient and adjustments were made to his diuretics.  Lasix was changed to torsemide and Aldactone was added on 12/05/2019.  He was brought to the hospital because of generalized weakness and shortness of breath.  He was found to have acute kidney injury and hypernatremia.    Assessment/Plan:   Principal Problem:   Generalized weakness Active Problems:   A-fib (HCC)   Type 2 diabetes mellitus with stage 3 chronic kidney disease (HCC)   Essential (primary) hypertension   Acute kidney injury superimposed on CKD (HCC)   Hypernatremia   Dementia with behavioral disturbance (HCC)   Hypoxia   Chronic anticoagulation    Hypernatremia: Sodium level is worse today.  Treat with 5% dextrose infusion at a lower rate, 50 cc/h and monitor sodium level.  AKI on CKD stage IIIa: BUN and creatinine appear to have peaked.  Renal ultrasound done today did not show any acute abnormality or obstruction.  Consulted nephrologist to assist with management.  Continue IV fluids.  Monitor BMP.  Chronic systolic CHF, moderate mitral regurgitation, moderate tricuspid regurgitation:  He has intermittent episodes of Cheyne-Stokes breathing Chest x-ray done today did not show any acute abnormality or pulmonary edema. Continue Coreg.  Lisinopril, torsemide and Aldactone have been held.  BMP decreased from [phone].  2D echo in May 2021 showed EF estimated at 25 to 30%, moderate MR, moderate TR.  Acute hypoxemic respiratory failure: No  hypercapnia on ABG today.   Suspect patient has underlying chronic hypoxemic respiratory failure.  Continue oxygen via nasal cannula  Atrial fibrillation: Continue Pradaxa and Coreg  IDDM: Continue Lantus and NovoLog.  Monitor glucose levels closely.  Dementia with behavioral disturbance: Continue Seroquel and Namenda  Consulted palliative care team.   Body mass index is 24.86 kg/m.  Diet Order            Diet heart healthy/carb modified Room service appropriate? Yes; Fluid consistency: Thin  Diet effective now                       Medications:   . carvedilol  6.25 mg Oral BID WC  . dabigatran  75 mg Oral BID  . insulin aspart  0-15 Units Subcutaneous TID WC  . insulin aspart  0-5 Units Subcutaneous QHS  . insulin glargine  8 Units Subcutaneous Daily  . isosorbide mononitrate  30 mg Oral Daily  . memantine  5 mg Oral BID  . QUEtiapine  25 mg Oral QHS  . sodium chloride flush  3 mL Intravenous Q12H   Continuous Infusions: . sodium chloride    . dextrose       Anti-infectives (From admission, onward)   None             Family Communication/Anticipated D/C date and plan/Code Status   DVT prophylaxis:  dabigatran (PRADAXA) capsule 75 mg     Code Status: Full Code  Family Communication: Plan of care was discussed with his  daughter, [name] Disposition Plan:    Status is: Inpatient  Remains inpatient appropriate because:IV treatments appropriate due to intensity of illness or inability to take PO and Inpatient level of care appropriate due to severity of illness   Dispo: The patient is from: SNF              Anticipated d/c is to: SNF              Anticipated d/c date is: 2 days              Patient currently is not medically stable to d/c.           Subjective:   Patient is unable to provide any history.  According to Elizabeth Lake, RN, patient was having intermittent episodes of apnea this morning  Objective:    Vitals:   12/07/19  2035 12/08/19 0422 12/08/19 0603 12/08/19 0803  BP: (!) 92/87 (!) 81/67 (!) 124/95 (!) 148/82  Pulse:  76  94  Resp: 20 18 18 17   Temp: 98 F (36.7 C) 97.7 F (36.5 C)  98.4 F (36.9 C)  TempSrc: Oral Oral    SpO2: 92% 96%  (!) 81%  Weight:      Height:       No data found.   Intake/Output Summary (Last 24 hours) at 12/08/2019 0855 Last data filed at 12/08/2019 0300 Gross per 24 hour  Intake 1060.41 ml  Output 225 ml  Net 835.41 ml   Filed Weights   12/06/19 0619  Weight: 74.2 kg    Exam:  GEN: NAD, mittens in place SKIN: No rash EYES: No pallor or icterus ENT: MMM CV: RRR PULM: b/l wheezing  ABD: soft, ND, NT, +BS CNS: Alert but disoriented.  Non focal EXT: No edema or tenderness   Data Reviewed:   I have personally reviewed following labs and imaging studies:  Labs: Labs show the following:   Basic Metabolic Panel: Recent Labs  Lab 12/05/19 1850 12/05/19 1850 12/06/19 0908 12/06/19 0908 12/07/19 0708 12/08/19 0544  NA 152*  --  153*  --  146* 149*  K 4.7   < > 5.2*   < > 4.6 4.4  CL 117*  --  118*  --  111 114*  CO2 25  --  23  --  25 25  GLUCOSE 134*  --  126*  --  147* 137*  BUN 54*  --  55*  --  57* 55*  CREATININE 2.71*  --  2.84*  --  2.88* 2.88*  CALCIUM 9.0  --  9.3  --  9.0 8.6*  MG  --   --   --   --   --  2.7*  PHOS  --   --   --   --   --  6.0*   < > = values in this interval not displayed.   GFR Estimated Creatinine Clearance: 19.1 mL/min (A) (by C-G formula based on SCr of 2.88 mg/dL (H)). Liver Function Tests: No results for input(s): AST, ALT, ALKPHOS, BILITOT, PROT, ALBUMIN in the last 168 hours. No results for input(s): LIPASE, AMYLASE in the last 168 hours. No results for input(s): AMMONIA in the last 168 hours. Coagulation profile No results for input(s): INR, PROTIME in the last 168 hours.  CBC: Recent Labs  Lab 12/05/19 1850 12/08/19 0544  WBC 6.8 7.1  NEUTROABS 4.6  --   HGB 11.8* 11.9*  HCT 37.4* 37.0*  MCV  95.4 94.4  PLT 187 195   Cardiac Enzymes: No results for input(s): CKTOTAL, CKMB, CKMBINDEX, TROPONINI in the last 168 hours. BNP (last 3 results) No results for input(s): PROBNP in the last 8760 hours. CBG: Recent Labs  Lab 12/07/19 1204 12/07/19 1645 12/07/19 2102 12/08/19 0618 12/08/19 0808  GLUCAP 124* 117* 130* 107* 131*   D-Dimer: No results for input(s): DDIMER in the last 72 hours. Hgb A1c: Recent Labs    12/06/19 0908  HGBA1C 7.0*   Lipid Profile: No results for input(s): CHOL, HDL, LDLCALC, TRIG, CHOLHDL, LDLDIRECT in the last 72 hours. Thyroid function studies: No results for input(s): TSH, T4TOTAL, T3FREE, THYROIDAB in the last 72 hours.  Invalid input(s): FREET3 Anemia work up: No results for input(s): VITAMINB12, FOLATE, FERRITIN, TIBC, IRON, RETICCTPCT in the last 72 hours. Sepsis Labs: Recent Labs  Lab 12/05/19 1850 12/08/19 0544  WBC 6.8 7.1    Microbiology Recent Results (from the past 240 hour(s))  MRSA PCR Screening     Status: None   Collection Time: 12/06/19  6:16 AM   Specimen: Nasopharyngeal  Result Value Ref Range Status   MRSA by PCR NEGATIVE NEGATIVE Final    Comment:        The GeneXpert MRSA Assay (FDA approved for NASAL specimens only), is one component of a comprehensive MRSA colonization surveillance program. It is not intended to diagnose MRSA infection nor to guide or monitor treatment for MRSA infections. Performed at [facility], [address] [place], [place] [address]   SARS Coronavirus 2 by RT PCR (hospital order, performed in [facility] hospital lab) Nasopharyngeal Nasopharyngeal Swab     Status: None   Collection Time: 12/06/19 11:44 PM   Specimen: Nasopharyngeal Swab  Result Value Ref Range Status   SARS Coronavirus 2 NEGATIVE NEGATIVE Final    Comment: (NOTE) SARS-CoV-2 target nucleic acids are NOT DETECTED.  The SARS-CoV-2 RNA is generally detectable in upper and lower respiratory specimens  during the acute phase of infection. The lowest concentration of SARS-CoV-2 viral copies this assay can detect is 250 copies / mL. A negative result does not preclude SARS-CoV-2 infection and should not be used as the sole basis for treatment or other patient management decisions.  A negative result may occur with improper specimen collection / handling, submission of specimen other than nasopharyngeal swab, presence of viral mutation(s) within the areas targeted by this assay, and inadequate number of viral copies (<250 copies / mL). A negative result must be combined with clinical observations, patient history, and epidemiological information.  Fact Sheet for Patients:   StrictlyIdeas.no  Fact Sheet for Healthcare Providers: BankingDealers.co.za  This test is not yet approved or  cleared by the [place] FDA and has been authorized for detection and/or diagnosis of SARS-CoV-2 by FDA under an Emergency Use Authorization (EUA).  This EUA will remain in effect (meaning this test can be used) for the duration of the COVID-19 declaration under Section 564(b)(1) of the Act, 21 U.S.C. section 360bbb-3(b)(1), unless the authorization is terminated or revoked sooner.  Performed at [facility], [address] [place], [place] [address]     Procedures and diagnostic studies:  No results found.             LOS: 3 days   Burwell Bethel  [facility]     12/08/2019, 8:55 AM

## 2019-12-09 DIAGNOSIS — N189 Chronic kidney disease, unspecified: Secondary | ICD-10-CM | POA: Diagnosis not present

## 2019-12-09 DIAGNOSIS — Z7189 Other specified counseling: Secondary | ICD-10-CM

## 2019-12-09 DIAGNOSIS — Z515 Encounter for palliative care: Secondary | ICD-10-CM

## 2019-12-09 DIAGNOSIS — N179 Acute kidney failure, unspecified: Secondary | ICD-10-CM | POA: Diagnosis not present

## 2019-12-09 DIAGNOSIS — R531 Weakness: Secondary | ICD-10-CM | POA: Diagnosis not present

## 2019-12-09 LAB — GLUCOSE, CAPILLARY
Glucose-Capillary: 92 mg/dL (ref 70–99)
Glucose-Capillary: 92 mg/dL (ref 70–99)
Glucose-Capillary: 110 mg/dL — ABNORMAL HIGH (ref 70–99)
Glucose-Capillary: 44 mg/dL — CL (ref 70–99)
Glucose-Capillary: 143 mg/dL — ABNORMAL HIGH (ref 70–99)

## 2019-12-09 LAB — BASIC METABOLIC PANEL
Anion gap: 8 (ref 5–15)
BUN: 61 mg/dL — ABNORMAL HIGH (ref 8–23)
CO2: 25 mmol/L (ref 22–32)
Calcium: 8.8 mg/dL — ABNORMAL LOW (ref 8.9–10.3)
Chloride: 113 mmol/L — ABNORMAL HIGH (ref 98–111)
Creatinine, Ser: 2.89 mg/dL — ABNORMAL HIGH (ref 0.61–1.24)
GFR calc Af Amer: 22 mL/min — ABNORMAL LOW (ref >60)
GFR calc non Af Amer: 19 mL/min — ABNORMAL LOW (ref >60)
Glucose, Bld: 93 mg/dL (ref 70–99)
Potassium: 4.6 mmol/L (ref 3.5–5.1)
Sodium: 146 mmol/L — ABNORMAL HIGH (ref 135–145)

## 2019-12-09 MED ORDER — ENSURE ENLIVE PO LIQD
237.0000 mL | Freq: Two times a day (BID) | ORAL | Status: DC
  Administered 2019-12-10 – 2019-12-13 (×6): 237 mL via ORAL

## 2019-12-09 MED ORDER — DEXTROSE 50 % IV SOLN
25.0000 g | Freq: Once | INTRAVENOUS | Status: AC
  Administered 2019-12-09: 25 g via INTRAVENOUS

## 2019-12-09 MED ORDER — DEXTROSE 50 % IV SOLN
INTRAVENOUS | Status: AC
  Filled 2019-12-09: qty 50

## 2019-12-09 NOTE — Plan of Care (Addendum)
PMT note:  Received a VM from daughter [name] with request to meet Friday at 9:00am as this is a day/time she can come to the hospital to meet during hours the palliative team is available. Per brother earlier today and her VM, she works from home. Called her to offer a phone conversation vs coming to the hospital if it is more convenient for her. She very directly stated she needed to be here for the meeting. Attempted to explain that if he is still admitted Friday we could meet, and if he is discharged before the meeting, palliative could meet outpatient, but was stopped mid sentence to ask where he would go. Explained that I do not have that information at this time, and it would be discussed by the MD or the CM/SW. She very directly stated she was at work and "I don't have time to deal with this right now. I'll see you Friday morning."   Called to speak with attending MD and update. If patient is discharged prior to Friday meeting, recommend palliative to follow outpatient.

## 2019-12-09 NOTE — Consult Note (Signed)
Consultation Note Date: 12/09/2019   Patient Name: Justin Manning  DOB: August 28, 1935  MRN: [id number]  Age / Sex: 84 y.o., male  PCP: Steele Sizer, MD Referring Physician: Jennye Boroughs, MD  Reason for Consultation: Establishing goals of care  HPI/Patient Profile:  Justin Manning is a 84 y.o. male with medical history significant for DM, HTN, CKD 3, dementia, HFrEF, last EF 25 to 30% in May 2021, A. fib on Pradaxa, who presented to the emergency room with generalized weakness, and shortness of breath which has been progressive.  Patient has been having his diuretic treatment adjusted.     Clinical Assessment and Goals of Care: Patient is resting in bed with mittens in place. Son Rilyn is at bedside. Per Marland Kitchen, patient has 3 children, and both he and his sister are HPOA's. This is noted in [place]. Patient is widowed.   Per son, patient has been in [facility] for the past 2 months for rehabilitation due to weakness. Mitsuo states his father arrived this admission dehydrated because he has been on fluid restriction to help with edema. Functionally, he tells me his father wears depends in case he has an accident. He needs assistance with eating his pureed diet. On a good day, he can discuss some events of the past, on bad days, he does not, and pulls at things.    Spoke with patient. He states he has 2-3 kids and he can state the name of his son Justin Manning. He cannot name the other 2 children. He cannot identify the son sitting at bedside, or state the relationship of his son.  Jerrett states he will speak with his sister, and call our team to arrange a time to have a [place] conversation.        SUMMARY OF RECOMMENDATIONS   Winford to speak with sister to arrange a time for a [place] conversation.   Prognosis:   Poor overall       Primary Diagnoses: Present on Admission: . Essential (primary)  hypertension . A-fib [facility])   I have reviewed the medical record, interviewed the patient and family, and examined the patient. The following aspects are pertinent.  Past Medical History:  Diagnosis Date  . Anemia   . CKD (chronic kidney disease), stage III    Dr. Juleen China  . Dementia ([place])   . Diabetes mellitus without complication ([place])   . GERD (gastroesophageal reflux disease)   . Hyperlipidemia   . Hypertension   . Mitral regurgitation    Mild   Social History   Socioeconomic History  . Marital status: Widowed    Spouse name: Janett Billow  . Number of children: 3  . Years of education: Not on file  . Highest education level: 11th grade  Occupational History  . Occupation: retired  Tobacco Use  . Smoking status: Former Smoker    Packs/day: 1.00    Years: 10.00    Pack years: 10.00    Types: Cigarettes    Quit date: 05/15/1988    Years since  quitting: 31.5  . Smokeless tobacco: Never Used  . Tobacco comment: smoking cessation materials not required  Vaping Use  . Vaping Use: Never used  Substance and Sexual Activity  . Alcohol use: No    Alcohol/week: 0.0 standard drinks    Comment: used to be a heavy drinker but quit 40 years ago   . Drug use: Not Currently    Types: Marijuana    Comment: many years ago   . Sexual activity: Not Currently  Other Topics Concern  . Not on file  Social History Narrative   He lives alone   Social Determinants of Health   Financial Resource Strain:   . Difficulty of Paying Living Expenses:   Food Insecurity:   . Worried About Charity fundraiser in the Last Year:   . Arboriculturist in the Last Year:   Transportation Needs:   . Film/video editor (Medical):   Marland Kitchen Lack of Transportation (Non-Medical):   Physical Activity:   . Days of Exercise per Week:   . Minutes of Exercise per Session:   Stress:   . Feeling of Stress :   Social Connections:   . Frequency of Communication with Friends and Family:   . Frequency of Social  Gatherings with Friends and Family:   . Attends Religious Services:   . Active Member of Clubs or Organizations:   . Attends Archivist Meetings:   Marland Kitchen Marital Status:    Family History  Problem Relation Age of Onset  . Alcohol abuse Brother   . Diabetes Daughter    Scheduled Meds: . carvedilol  6.25 mg Oral BID WC  . dabigatran  75 mg Oral BID  . insulin aspart  0-15 Units Subcutaneous TID WC  . insulin aspart  0-5 Units Subcutaneous QHS  . insulin glargine  8 Units Subcutaneous Daily  . isosorbide mononitrate  30 mg Oral Daily  . memantine  5 mg Oral BID  . QUEtiapine  25 mg Oral QHS  . sodium chloride flush  3 mL Intravenous Q12H   Continuous Infusions: . sodium chloride     PRN Meds:.sodium chloride, acetaminophen, food thickener, ipratropium-albuterol, ondansetron (ZOFRAN) IV, sodium chloride flush Medications Prior to Admission:  Prior to Admission medications   Medication Sig Start Date End Date Taking? Authorizing Provider  bisacodyl (DULCOLAX) 5 MG EC tablet Take 1 tablet (5 mg total) by mouth daily as needed for moderate constipation. 10/06/19  Yes Lorella Nimrod, MD  carvedilol (COREG) 6.25 MG tablet Take 1 tablet (6.25 mg total) by mouth 2 (two) times daily with a meal. 10/06/19  Yes Lorella Nimrod, MD  Cholecalciferol 25 MCG (1000 UT) tablet Take 2,000 Units by mouth 2 (two) times daily.    Yes [provider]  Cyanocobalamin (VITAMIN B 12) 100 MCG LOZG Take 100 mcg by mouth daily.    Yes [provider]  dabigatran (PRADAXA) 75 MG CAPS capsule Take 75 mg by mouth 2 (two) times daily.    Yes [provider]  glucose blood (FREESTYLE LITE) test strip TEST BLOOD SUGAR THREE TIMES DAILY AS DIRECTED.. 11/11/18  Yes Sowles, Drue Stager, MD  Insulin Pen Needle (B-D ULTRAFINE III SHORT PEN) 31G X 8 MM MISC USE DAILY WITH LANTUS 03/04/19  Yes Sowles, Drue Stager, MD  isosorbide mononitrate (IMDUR) 30 MG 24 hr tablet Take 1 tablet (30 mg total) by mouth  daily. 10/06/19  Yes Lorella Nimrod, MD  LANTUS SOLOSTAR 100 UNIT/ML Solostar Pen INJECT 8  UNITS UNDER THE SKIN DAILY Patient taking differently: Inject 8 Units into the skin at bedtime.  08/26/19  Yes Sowles, Drue Stager, MD  lisinopril (ZESTRIL) 20 MG tablet TAKE 1 TABLET(20 MG) BY MOUTH TWICE DAILY Patient taking differently: Take 20 mg by mouth in the morning and at bedtime.  11/20/19  Yes Sowles, Drue Stager, MD  memantine (NAMENDA) 5 MG tablet TAKE 1 TABLET(5 MG) BY MOUTH TWICE DAILY Patient taking differently: Take 5 mg by mouth 2 (two) times daily.  11/20/19  Yes Sowles, Drue Stager, MD  Multiple Vitamin (MULTIVITAMIN WITH MINERALS) TABS tablet Take 1 tablet by mouth daily. 10/06/19  Yes Lorella Nimrod, MD  ondansetron (ZOFRAN) 4 MG tablet Take 1 tablet (4 mg total) by mouth every 6 (six) hours as needed for nausea. 10/06/19  Yes Lorella Nimrod, MD  polyethylene glycol (MIRALAX / GLYCOLAX) 17 g packet Take 17 g by mouth daily as needed for mild constipation or moderate constipation. 10/06/19  Yes Lorella Nimrod, MD  QUEtiapine (SEROQUEL) 25 MG tablet Take 1 tablet (25 mg total) by mouth at bedtime. 10/06/19  Yes Lorella Nimrod, MD  furosemide (LASIX) 20 MG tablet Take 1 tablet (20 mg total) by mouth daily. Patient not taking: Reported on 12/05/2019 11/09/19 12/09/19  Blake Divine, MD  hydrALAZINE (APRESOLINE) 100 MG tablet Take 1 tablet (100 mg total) by mouth 3 (three) times daily. Patient not taking: Reported on 11/09/2019 10/06/19   Lorella Nimrod, MD  potassium chloride SA (KLOR-CON) 20 MEQ tablet Take 20 mEq by mouth daily.    [provider]  spironolactone (ALDACTONE) 25 MG tablet Take 25 mg by mouth daily.    [provider]  torsemide (DEMADEX) 20 MG tablet Take 40 mg by mouth daily.    [provider]   Allergies  Allergen Reactions  . Aspirin     tachycardia   Review of Systems  Unable to perform ROS   Physical Exam Pulmonary:     Effort: Pulmonary effort is normal.    Neurological:     Mental Status: He is alert.     Vital Signs: BP (!) 136/87 (BP Location: Left Arm)   Pulse 81   Temp (!) 97.5 F (36.4 C) (Oral)   Resp 20   Ht 5\' 8"  (1.727 m)   Wt 74.2 kg   SpO2 100%   BMI 24.86 kg/m  Pain Scale: 0-10   Pain Score: 0-No pain   SpO2: SpO2: 100 % O2 Device:SpO2: 100 % O2 Flow Rate: .O2 Flow Rate (L/min): 4 L/min  IO: Intake/output summary:   Intake/Output Summary (Last 24 hours) at 12/09/2019 1151 Last data filed at 12/09/2019 0400 Gross per 24 hour  Intake 0 ml  Output 600 ml  Net -600 ml    LBM: Last BM Date: 12/08/19 Baseline Weight: Weight: 74.2 kg Most recent weight: Weight:  (lift in room does not work)     Palliative Assessment/Data:     Time In: 11:00 Time Out: 11:50 Time Total: 50 min Greater than 50%  of this time was spent counseling and coordinating care related to the above assessment and plan.  Signed by: Asencion Gowda, NP   Please contact Palliative Medicine Team phone at [phone] for questions and concerns.  For individual provider: See Shea Evans

## 2019-12-09 NOTE — Progress Notes (Signed)
Hypoglycemic Event  CBG: 44  Treatment: Pt refused oral intake. IV dextrose given. MD notified.   Symptoms: asymptomatic  Follow-up CBG: Time:1820 CBG Result:110  Possible Reasons for Event: very poor PO intake  Comments/MD notified:MD notified.     Margarita Mail

## 2019-12-09 NOTE — Progress Notes (Signed)
MD notified. Pts CBG is 44. Pt has refused oral intake this shift. RN encouraged pt to drink juice but pt refused. RN orders amp of dextrose per protocol. Pt is confused and refusing oral intake. I will continue to assess.

## 2019-12-09 NOTE — Progress Notes (Addendum)
Progress Note    FILIP LUTEN  ZJQ:[id number] DOB: 14-Jan-1936  DOA: 12/05/2019 PCP: Steele Sizer, MD      Brief Narrative:    Medical records reviewed and are as summarized below:  Justin Manning is a 84 y.o. male  with medical history significant for DM, HTN, CKD 3a, dementia, HFrEF, last EF 25 to 30% in May 2021, A. fib on Pradaxa, who presented to the emergency room with generalized weakness.  He was recently evaluated by his cardiologist as an outpatient and adjustments were made to his diuretics.  Lasix was changed to torsemide and Aldactone was added on 12/05/2019.  He was brought to the hospital because of generalized weakness and shortness of breath.  He was found to have acute kidney injury and hypernatremia. He was given IV fluids for hydration. However, IV fluids were discontinued because of concern for fluid overload. Cardiologist and nephrologist were consulted to assist with management.   Assessment/Plan:   Principal Problem:   Generalized weakness Active Problems:   A-fib (HCC)   Type 2 diabetes mellitus with stage 3 chronic kidney disease (HCC)   Essential (primary) hypertension   Acute kidney injury superimposed on CKD (HCC)   Hypernatremia   Dementia with behavioral disturbance (HCC)   Hypoxia   Chronic anticoagulation    Hypernatremia: Sodium level is trending down. Monitor off of IV fluids. Encourage adequate oral hydration  AKI on CKD stage IIIa: BUN and creatinine slowly trending up. Renal ultrasound done on 12/08/2019 did not show any acute abnormality or obstruction. Monitor BMP off of IV fluids because of concern for fluid overload. Follow-up with nephrologist.  Chronic systolic CHF, moderate mitral regurgitation, moderate tricuspid regurgitation:  He has intermittent episodes of Cheyne-Stokes breathing Chest x-ray done 12/08/2019 did not show any acute abnormality or pulmonary edema. Continue Coreg.  Lisinopril, torsemide and Aldactone  have been held.  BMP decreased from [phone].  2D echo in May 2021 showed EF estimated at 25 to 30%, moderate MR, moderate TR. Follow-up with cardiologist.  Acute hypoxemic respiratory failure: No hypercapnia on ABG on 12/08/2019  Suspect patient has underlying chronic hypoxemic respiratory failure.  Continue oxygen via nasal cannula  Atrial fibrillation: Continue Pradaxa and Coreg  IDDM: Continue Lantus and NovoLog.  Monitor glucose levels closely.  Dementia with behavioral disturbance: Continue Seroquel and Namenda  Plan of care was discussed with her daughter, [name]. Diagnosis and prognosis were discussed. She understands that her daughter is not doing very well. Discussed comfort measures with hospice. She is open to the idea and would like to speak to the hospice team. Hospice team has been consulted. CODE STATUS was discussed. She prefers to talk to her brother first before making any further decision.  Of note, palliative care has been consulted. Crystal, NP, from palliative care team had spoken to his daughter who said she would only be available on Friday morning for a face-to-face meeting with palliative care team. She did not want to have any discussion by phone with palliative care team.   Body mass index is 24.86 kg/m.  Diet Order            DIET - DYS 1 Room service appropriate? Yes; Fluid consistency: Thin  Diet effective now                       Medications:   . carvedilol  6.25 mg Oral BID WC  . dabigatran  75 mg Oral BID  .  insulin aspart  0-15 Units Subcutaneous TID WC  . insulin aspart  0-5 Units Subcutaneous QHS  . insulin glargine  8 Units Subcutaneous Daily  . isosorbide mononitrate  30 mg Oral Daily  . memantine  5 mg Oral BID  . QUEtiapine  25 mg Oral QHS  . sodium chloride flush  3 mL Intravenous Q12H   Continuous Infusions: . sodium chloride       Anti-infectives (From admission, onward)   None             Family  Communication/Anticipated D/C date and plan/Code Status   DVT prophylaxis:  dabigatran (PRADAXA) capsule 75 mg     Code Status: Full Code  Family Communication: Plan of care was discussed with his daughter, [name] Disposition Plan:    Status is: Inpatient  Remains inpatient appropriate because:IV treatments appropriate due to intensity of illness or inability to take PO and Inpatient level of care appropriate due to severity of illness   Dispo: The patient is from: SNF              Anticipated d/c is to: SNF              Anticipated d/c date is: 2 days              Patient currently is not medically stable to d/c.           Subjective:   No acute issues overnight. Patient is unable to provide any history.  Objective:    Vitals:   12/09/19 0400 12/09/19 0440 12/09/19 0724 12/09/19 1205  BP: (!) 147/99  (!) 136/87 109/79  Pulse: 67 (!) 44 81 64  Resp: 20     Temp: 98.5 F (36.9 C)  (!) 97.5 F (36.4 C) 97.8 F (36.6 C)  TempSrc: Oral  Oral Oral  SpO2: 97% 95% 100% 90%  Weight:      Height:       No data found.   Intake/Output Summary (Last 24 hours) at 12/09/2019 1519 Last data filed at 12/09/2019 0400 Gross per 24 hour  Intake 0 ml  Output 600 ml  Net -600 ml   Filed Weights   12/06/19 0619  Weight: 74.2 kg    Exam:  GEN: NAD, mittens in place SKIN: No rash EYES: Anicteric ENT: MMM CV: RRR PULM: Bilateral wheezing but no rales heard. ABD: soft, ND, NT, +BS CNS: Alert but disoriented.  Non focal EXT: No edema or tenderness   Data Reviewed:   I have personally reviewed following labs and imaging studies:  Labs: Labs show the following:   Basic Metabolic Panel: Recent Labs  Lab 12/05/19 1850 12/05/19 1850 12/06/19 0908 12/06/19 0908 12/07/19 0708 12/07/19 0708 12/08/19 0544 12/09/19 0653  NA 152*  --  153*  --  146*  --  149* 146*  K 4.7   < > 5.2*   < > 4.6   < > 4.4 4.6  CL 117*  --  118*  --  111  --  114* 113*  CO2 25   --  23  --  25  --  25 25  GLUCOSE 134*  --  126*  --  147*  --  137* 93  BUN 54*  --  55*  --  57*  --  55* 61*  CREATININE 2.71*  --  2.84*  --  2.88*  --  2.88* 2.89*  CALCIUM 9.0  --  9.3  --  9.0  --  8.6* 8.8*  MG  --   --   --   --   --   --  2.7*  --   PHOS  --   --   --   --   --   --  6.0*  --    < > = values in this interval not displayed.   GFR Estimated Creatinine Clearance: 19.1 mL/min (A) (by C-G formula based on SCr of 2.89 mg/dL (H)). Liver Function Tests: No results for input(s): AST, ALT, ALKPHOS, BILITOT, PROT, ALBUMIN in the last 168 hours. No results for input(s): LIPASE, AMYLASE in the last 168 hours. No results for input(s): AMMONIA in the last 168 hours. Coagulation profile No results for input(s): INR, PROTIME in the last 168 hours.  CBC: Recent Labs  Lab 12/05/19 1850 12/08/19 0544  WBC 6.8 7.1  NEUTROABS 4.6  --   HGB 11.8* 11.9*  HCT 37.4* 37.0*  MCV 95.4 94.4  PLT 187 195   Cardiac Enzymes: No results for input(s): CKTOTAL, CKMB, CKMBINDEX, TROPONINI in the last 168 hours. BNP (last 3 results) No results for input(s): PROBNP in the last 8760 hours. CBG: Recent Labs  Lab 12/08/19 1153 12/08/19 1658 12/08/19 2053 12/09/19 0725 12/09/19 1205  GLUCAP 94 142* 152* 92 92   D-Dimer: No results for input(s): DDIMER in the last 72 hours. Hgb A1c: No results for input(s): HGBA1C in the last 72 hours. Lipid Profile: No results for input(s): CHOL, HDL, LDLCALC, TRIG, CHOLHDL, LDLDIRECT in the last 72 hours. Thyroid function studies: No results for input(s): TSH, T4TOTAL, T3FREE, THYROIDAB in the last 72 hours.  Invalid input(s): FREET3 Anemia work up: No results for input(s): VITAMINB12, FOLATE, FERRITIN, TIBC, IRON, RETICCTPCT in the last 72 hours. Sepsis Labs: Recent Labs  Lab 12/05/19 1850 12/08/19 0544  WBC 6.8 7.1    Microbiology Recent Results (from the past 240 hour(s))  MRSA PCR Screening     Status: None   Collection Time:  12/06/19  6:16 AM   Specimen: Nasopharyngeal  Result Value Ref Range Status   MRSA by PCR NEGATIVE NEGATIVE Final    Comment:        The GeneXpert MRSA Assay (FDA approved for NASAL specimens only), is one component of a comprehensive MRSA colonization surveillance program. It is not intended to diagnose MRSA infection nor to guide or monitor treatment for MRSA infections. Performed at [facility], [address] [place], [place] [address]   SARS Coronavirus 2 by RT PCR (hospital order, performed in [facility] hospital lab) Nasopharyngeal Nasopharyngeal Swab     Status: None   Collection Time: 12/06/19 11:44 PM   Specimen: Nasopharyngeal Swab  Result Value Ref Range Status   SARS Coronavirus 2 NEGATIVE NEGATIVE Final    Comment: (NOTE) SARS-CoV-2 target nucleic acids are NOT DETECTED.  The SARS-CoV-2 RNA is generally detectable in upper and lower respiratory specimens during the acute phase of infection. The lowest concentration of SARS-CoV-2 viral copies this assay can detect is 250 copies / mL. A negative result does not preclude SARS-CoV-2 infection and should not be used as the sole basis for treatment or other patient management decisions.  A negative result may occur with improper specimen collection / handling, submission of specimen other than nasopharyngeal swab, presence of viral mutation(s) within the areas targeted by this assay, and inadequate number of viral copies (<250 copies / mL). A negative result must be combined with clinical observations, patient history, and epidemiological information.  Fact Sheet for Patients:   StrictlyIdeas.no  Fact Sheet for Healthcare Providers: BankingDealers.co.za  This test is not yet approved or  cleared by the [place] FDA and has been authorized for detection and/or diagnosis of SARS-CoV-2 by FDA under an Emergency Use Authorization (EUA).  This EUA will  remain in effect (meaning this test can be used) for the duration of the COVID-19 declaration under Section 564(b)(1) of the Act, 21 U.S.C. section 360bbb-3(b)(1), unless the authorization is terminated or revoked sooner.  Performed at [facility], [address], [place], [place] [address]     Procedures and diagnostic studies:  US RENAL  Result Date: 2020-01-06 CLINICAL DATA:  Acute kidney injury. EXAM: RENAL / URINARY TRACT ULTRASOUND COMPLETE COMPARISON:  01/09/2013 FINDINGS: Right Kidney: Renal measurements: 8.9 x 4.3 x 4.5 cm = volume: 89 mL . Echogenicity within normal limits. No mass or hydronephrosis visualized. Left Kidney: Renal measurements: 8.8 x 5.7 x 3.9 cm = volume: 100 mL. 11 mm cortical cyst noted interpolar region. No hydronephrosis. Bladder: Bladder is incompletely distended but bladder wall appears thickened and somewhat irregular despite the decompressed state. Other: Bilateral pleural effusions noted. Small volume intraperitoneal free fluid. IMPRESSION: 1. No hydronephrosis. 2. 11 mm left renal cortical cyst. Electronically Signed   By: Misty Stanley M.D.   On: 01-06-2020 10:46   DG Chest Port 1 View  Result Date: 06-Jan-2020 CLINICAL DATA:  Weakness, shortness of breath, wheezing EXAM: PORTABLE CHEST 1 VIEW COMPARISON:  12/05/2019 FINDINGS: Cardiomegaly. Unchanged small, layering bilateral pleural effusions. No focal airspace opacity. IMPRESSION: 1.  Cardiomegaly. 2. Unchanged small, layering bilateral pleural effusions. No focal airspace opacity. Electronically Signed   By: Eddie Candle M.D.   On: 01/06/2020 09:25               LOS: 4 days   Antonea Gaut  [facility]     12/09/2019, 3:19 PM

## 2019-12-10 DIAGNOSIS — R531 Weakness: Secondary | ICD-10-CM | POA: Diagnosis not present

## 2019-12-10 DIAGNOSIS — E1121 Type 2 diabetes mellitus with diabetic nephropathy: Secondary | ICD-10-CM | POA: Diagnosis not present

## 2019-12-10 DIAGNOSIS — N1831 Chronic kidney disease, stage 3a: Secondary | ICD-10-CM

## 2019-12-10 DIAGNOSIS — E870 Hyperosmolality and hypernatremia: Secondary | ICD-10-CM | POA: Diagnosis not present

## 2019-12-10 DIAGNOSIS — Z794 Long term (current) use of insulin: Secondary | ICD-10-CM

## 2019-12-10 DIAGNOSIS — N179 Acute kidney failure, unspecified: Secondary | ICD-10-CM | POA: Diagnosis not present

## 2019-12-10 LAB — BASIC METABOLIC PANEL
Anion gap: 9 (ref 5–15)
BUN: 62 mg/dL — ABNORMAL HIGH (ref 8–23)
CO2: 26 mmol/L (ref 22–32)
Calcium: 8.9 mg/dL (ref 8.9–10.3)
Chloride: 116 mmol/L — ABNORMAL HIGH (ref 98–111)
Creatinine, Ser: 2.7 mg/dL — ABNORMAL HIGH (ref 0.61–1.24)
GFR calc Af Amer: 24 mL/min — ABNORMAL LOW (ref >60)
GFR calc non Af Amer: 21 mL/min — ABNORMAL LOW (ref >60)
Glucose, Bld: 74 mg/dL (ref 70–99)
Potassium: 4.7 mmol/L (ref 3.5–5.1)
Sodium: 151 mmol/L — ABNORMAL HIGH (ref 135–145)

## 2019-12-10 LAB — GLUCOSE, CAPILLARY
Glucose-Capillary: 69 mg/dL — ABNORMAL LOW (ref 70–99)
Glucose-Capillary: 72 mg/dL (ref 70–99)
Glucose-Capillary: 162 mg/dL — ABNORMAL HIGH (ref 70–99)
Glucose-Capillary: 105 mg/dL — ABNORMAL HIGH (ref 70–99)
Glucose-Capillary: 221 mg/dL — ABNORMAL HIGH (ref 70–99)

## 2019-12-10 MED ORDER — INSULIN ASPART 100 UNIT/ML ~~LOC~~ SOLN
0.0000 [IU] | Freq: Three times a day (TID) | SUBCUTANEOUS | Status: DC
  Administered 2019-12-11: 1 [IU] via SUBCUTANEOUS
  Administered 2019-12-11: 2 [IU] via SUBCUTANEOUS
  Administered 2019-12-11: 3 [IU] via SUBCUTANEOUS
  Administered 2019-12-13: 1 [IU] via SUBCUTANEOUS
  Filled 2019-12-10 (×4): qty 1

## 2019-12-10 MED ORDER — DEXTROSE 5 % IV SOLN: INTRAVENOUS | Status: DC

## 2019-12-10 NOTE — Progress Notes (Signed)
Hypoglycemic Event  CBG: 69  Treatment: 4 oz juice/soda  Symptoms: None  Follow-up CBG: Time:0836 CBG Result:72  Possible Reasons for Event: Inadequate meal intake  Comments/MD notified:MD notified.    Justin Manning

## 2019-12-10 NOTE — Progress Notes (Signed)
[facility], [place] 12/10/19  Subjective:   LOS: 5 07/27 0701 - 07/28 0700 In: 0  Out: 350 [Urine:350]  Patient known to our practice from outpatient follow-up.Consult for acute kidney injury.  Baseline renal function of 1.53/GFR 42-48 from October 14, 2019.  Serum creatinine at present increased to 2.88. Patient presented to the ER via EMS from [facility] for shortness of breath.  Was found to be hypoxic on room air therefore required oxygen supplementation by nasal cannula.  He has baseline dementia therefore not able to provide meaningful information/history.  He has CHF with EF of 25 to 30% and atrial fibrillation. At present, he is able to eat pured food with assistance.  He makes grunting noises which are likely upper airway sounds. Today he is resting quietly.  No acute events reported. Palliative care and hospice evaluation in progress. Sodium level is high again at 151  Objective:  Vital signs in last 24 hours:  Temp:  [98 F (36.7 C)] 98 F (36.7 C) (07/28 0524) Pulse Rate:  [82-100] 85 (07/28 1308) Resp:  [18-20] 18 (07/28 1308) BP: (108-151)/(90-109) 122/96 (07/28 1308) SpO2:  [92 %-99 %] 99 % (07/28 0524) Weight:  [73.5 kg] 73.5 kg (07/28 0524)  Weight change:  Filed Weights   12/06/19 0619 12/10/19 0524  Weight: 74.2 kg 73.5 kg    Intake/Output:    Intake/Output Summary (Last 24 hours) at 12/10/2019 1638 Last data filed at 12/10/2019 1000 Gross per 24 hour  Intake 240 ml  Output 350 ml  Net -110 ml     Physical Exam: General:  Frail, elderly gentleman, laying in bed  HEENT  moist oral mucous membranes  Pulm/lungs  normal breathing effort  CVS/Heart  irregular rhythm,  Abdomen:   Soft, nontender  Extremities:  + Dependent edema  Neurologic:  Resting quietly.  Skin:  Warm, dry   Mitten restraints in place       Basic Metabolic Panel:  Recent Labs  Lab 12/06/19 0908 12/06/19 0908 12/07/19 0708 12/07/19 0708  12/08/19 0544 12/09/19 0653 12/10/19 0355  NA 153*  --  146*  --  149* 146* 151*  K 5.2*  --  4.6  --  4.4 4.6 4.7  CL 118*  --  111  --  114* 113* 116*  CO2 23  --  25  --  25 25 26   GLUCOSE 126*  --  147*  --  137* 93 74  BUN 55*  --  57*  --  55* 61* 62*  CREATININE 2.84*  --  2.88*  --  2.88* 2.89* 2.70*  CALCIUM 9.3   < > 9.0   < > 8.6* 8.8* 8.9  MG  --   --   --   --  2.7*  --   --   PHOS  --   --   --   --  6.0*  --   --    < > = values in this interval not displayed.     CBC: Recent Labs  Lab 12/05/19 1850 12/08/19 0544  WBC 6.8 7.1  NEUTROABS 4.6  --   HGB 11.8* 11.9*  HCT 37.4* 37.0*  MCV 95.4 94.4  PLT 187 195     No results found for: HEPBSAG, HEPBSAB, HEPBIGM    Microbiology:  Recent Results (from the past 240 hour(s))  MRSA PCR Screening     Status: None   Collection Time: 12/06/19  6:16 AM   Specimen:  Nasopharyngeal  Result Value Ref Range Status   MRSA by PCR NEGATIVE NEGATIVE Final    Comment:        The GeneXpert MRSA Assay (FDA approved for NASAL specimens only), is one component of a comprehensive MRSA colonization surveillance program. It is not intended to diagnose MRSA infection nor to guide or monitor treatment for MRSA infections. Performed at [facility], [address] [place], [place] [address]   SARS Coronavirus 2 by RT PCR (hospital order, performed in [facility] hospital lab) Nasopharyngeal Nasopharyngeal Swab     Status: None   Collection Time: 12/06/19 11:44 PM   Specimen: Nasopharyngeal Swab  Result Value Ref Range Status   SARS Coronavirus 2 NEGATIVE NEGATIVE Final    Comment: (NOTE) SARS-CoV-2 target nucleic acids are NOT DETECTED.  The SARS-CoV-2 RNA is generally detectable in upper and lower respiratory specimens during the acute phase of infection. The lowest concentration of SARS-CoV-2 viral copies this assay can detect is 250 copies / mL. A negative result does not preclude SARS-CoV-2  infection and should not be used as the sole basis for treatment or other patient management decisions.  A negative result may occur with improper specimen collection / handling, submission of specimen other than nasopharyngeal swab, presence of viral mutation(s) within the areas targeted by this assay, and inadequate number of viral copies (<250 copies / mL). A negative result must be combined with clinical observations, patient history, and epidemiological information.  Fact Sheet for Patients:   StrictlyIdeas.no  Fact Sheet for Healthcare Providers: BankingDealers.co.za  This test is not yet approved or  cleared by the [place] FDA and has been authorized for detection and/or diagnosis of SARS-CoV-2 by FDA under an Emergency Use Authorization (EUA).  This EUA will remain in effect (meaning this test can be used) for the duration of the COVID-19 declaration under Section 564(b)(1) of the Act, 21 U.S.C. section 360bbb-3(b)(1), unless the authorization is terminated or revoked sooner.  Performed at [facility], [address] [place], [place] [address]     Coagulation Studies: No results for input(s): LABPROT, INR in the last 72 hours.  Urinalysis: No results for input(s): COLORURINE, LABSPEC, PHURINE, GLUCOSEU, HGBUR, BILIRUBINUR, KETONESUR, PROTEINUR, UROBILINOGEN, NITRITE, LEUKOCYTESUR in the last 72 hours.  Invalid input(s): APPERANCEUR    Imaging: No results found.   Medications:    sodium chloride      carvedilol  6.25 mg Oral BID WC   dabigatran  75 mg Oral BID   feeding supplement (ENSURE ENLIVE)  237 mL Oral BID BM   insulin aspart  0-5 Units Subcutaneous QHS   insulin aspart  0-9 Units Subcutaneous TID WC   isosorbide mononitrate  30 mg Oral Daily   memantine  5 mg Oral BID   QUEtiapine  25 mg Oral QHS   sodium chloride flush  3 mL Intravenous Q12H   sodium chloride,  acetaminophen, food thickener, ipratropium-albuterol, ondansetron (ZOFRAN) IV, sodium chloride flush  Assessment/ Plan:  84 y.o. male with hypertension, atrial fibrillation, diabetes mellitus type 2 insulin requiring, BPH , chronic kidney disease stage IIIB with proteinuria.  Presented to ER via [facility] for shortness of breath  Principal Problem:   Generalized weakness Active Problems:   A-fib (HCC)   Type 2 diabetes mellitus with stage 3 chronic kidney disease (HCC)   Essential (primary) hypertension   Acute kidney injury superimposed on CKD ([place])   Hypernatremia   Dementia with behavioral disturbance ([place])  Hypoxia   Chronic anticoagulation  #Hypernatremia Needs free water supplementation. Oral supplementation is being done with Thick-It but requires assistance with intake Patient is not able to maintain adequate fluid intake to maintain normal sodium Palliative care and hospice evaluation is in progress.  Will follow  #.  AKI on CKD st stage IIIb.  Baseline creatinine 1.53, GFR 42-48 from October 14, 2019 Recent Labs    12/07/19 0708 12/08/19 0544 12/09/19 0653 12/10/19 0355  CREATININE 2.88* 2.88* 2.89* 2.70*  AKI likely secondary to hemodynamic instability, leading to ATN Patient received multiple doses of IV Lasix injection this admission. Renal ultrasound from July 26 shows bilateral pleural effusion, no hydronephrosis, left renal cortical cyst Serum creatinine trend is improving  #. Anemia of CKD  Lab Results  Component Value Date   HGB 11.9 (L) 12/08/2019     #. Diabetes type 2 with CKD Hemoglobin A1C (no units)  Date Value  07/09/2017 6.2   HbA1c, POC (controlled diabetic range) (%)  Date Value  08/23/2018 6.4   Hgb A1c MFr Bld (%)  Date Value  12/06/2019 7.0 (H)  Acceptable control for age and general condition     LOS: [place] 7/28/20214:38 [place], [place]

## 2019-12-10 NOTE — Plan of Care (Signed)
PMT note:  Per IM message from attending MD, hospice has been consulted, and palliative consult is canceled.

## 2019-12-10 NOTE — Progress Notes (Signed)
PROGRESS NOTE    Justin Manning   JIR:[id number]  DOB: Sep 20, 1935  PCP: Steele Sizer, MD    DOA: 12/05/2019 LOS: 5   Brief Narrative   Justin Manning is a 84 y.o. male  with medical history significant for DM, HTN, CKD 3a, dementia, HFrEF, last EF 25 to 30% in May 2021, A. fib on Pradaxa, who presented to the emergency room with generalized weakness and shortness of breath.   He was recently evaluated by his cardiologist as an outpatient and adjustments were made to his diuretics.  Lasix was changed to torsemide and Aldactone was added on 12/05/2019.  He was found to have acute kidney injury and hypernatremia, consistent with dehydration. He was given IV fluids for hydration. However, IV fluids were discontinued because of concern for fluid overload.   Cardiology and nephrology consulted.  Palliative consult for goals of care discussion is pending at this time.     Assessment & Plan   Principal Problem:   Generalized weakness Active Problems:   A-fib (HCC)   Type 2 diabetes mellitus with stage 3 chronic kidney disease (HCC)   Essential (primary) hypertension   Acute kidney injury superimposed on CKD (HCC)   Hypernatremia   Dementia with behavioral disturbance (HCC)   Hypoxia   Chronic anticoagulation   HypERnatremia -present on admission, had improved with IV hydration but this was stopped due to concern for volume overload.  Sodium increased from 146 to 151 today.  Encourage patient to drink water as much as possible.  Will run very gentle D5W at 50 cc/h x 1 day, given goals of care discussion is still pending.  Monitor BMP daily.  Acute kidney injury superimposed on CKD stage IIIa -AKI present on admission due to prerenal azotemia in the setting of dehydration.  Renal ultrasound on 7/26 was negative for obstruction or other abnormalities.  Avoid nephrotoxins and hypotension.  Renally dose meds as indicated.  Monitor BMP.  Chronic systolic CHF / valvular heart disease  -not acutely decompensated.  Monitor volume and respiratory status closely with fluids running for hypernatremia.  Continue Coreg.  Lisinopril, torsemide and spironolactone are on hold due to AKI.  Cardiology following.  Acute versus chronic respiratory failure with hypoxia -ABG on 7/26 ruled out hypercarbia.  Continue supplemental oxygen to maintain O2 sat at or above 90%.  Atrial fibrillation -rate controlled.  Continue Pradaxa and Coreg  Insulin-dependent type 2 diabetes -continue Lantus and NovoLog  Dementia with behavioral disturbance -continue Seroquel and Namenda  Goals of care -prior attending physician had discussion with patient's daughter, [name] regarding his diagnoses and prognosis.  Palliative care was consulted and had plans to meet with patient's daughter on Friday at 9 AM.  Per report, prior attending had discussion with patient's daughter about hospice and she sounded amenable pending discussion with her brother.  Hospice is now consulted and palliative is signed off Laurann [place] NP).  Goals of care discussion is needed.      Patient BMI: Body mass index is 24.63 kg/m.   DVT prophylaxis:  dabigatran (PRADAXA) capsule 75 mg   Diet:  Diet Orders (From admission, onward)    Start     Ordered   12/08/19 1257  DIET - DYS 1 Room service appropriate? Yes; Fluid consistency: Thin  Diet effective now       Comments: Heart healthy  Question Answer Comment  Room service appropriate? Yes   Fluid consistency: Thin      12/08/19 1257  Code Status: Full Code    Subjective 12/10/19    Patient seen this morning at bedside.  When asked if he was in pain he said " I already told you not in pain".  When I told him this was the first time he is meeting me, he said it would be the last time.  He did not cooperate with any further questioning regarding symptoms.  Remains in mittens due to intermittent agitation.  No family at bedside   Disposition Plan & Communication    Status is: Inpatient  Remains inpatient appropriate because:Inpatient level of care appropriate due to severity of illness   Dispo: The patient is from: SNF              Anticipated d/c is to: SNF with palliative               Anticipated d/c date is: 2 days              Patient currently is not medically stable to d/c.     Family Communication: Spoke with daughter, [name], by phone this afternoon.  She requests goals of care discussion with palliative care team.  She and brother are considering hospice, but are not yet ready to make that decision, would like to have overall GOC talk first.  She expresses great concern of his care at the SNF, feels he is not well cared for there.   Consults, Procedures, Significant Events   Consultants:   Cardiology  Nephrology  Palliative Care  Procedures:   none  Antimicrobials:   none    Objective   Vitals:   12/09/19 1941 12/10/19 0524 12/10/19 0755 12/10/19 1308  BP: (!) 108/90 (!) 132/100 (!) 151/109 (!) 122/96  Pulse: 98 100 82 85  Resp: 20 20 20 18   Temp: 98 F (36.7 C) 98 F (36.7 C)    TempSrc: Oral Oral    SpO2: 92% 99%    Weight:  73.5 kg    Height:        Intake/Output Summary (Last 24 hours) at 12/10/2019 1646 Last data filed at 12/10/2019 1000 Gross per 24 hour  Intake 240 ml  Output 350 ml  Net -110 ml   Filed Weights   12/06/19 0619 12/10/19 0524  Weight: 74.2 kg 73.5 kg    Physical Exam:  General exam: awake, alert, no acute distress HEENT: moist mucus membranes, hearing grossly normal  Respiratory system: CTAB on exam limited by patient talking, normal respiratory effort. Cardiovascular system: normal S1/S2, RRR, no pedal edema.   Gastrointestinal system: soft, NT, ND, no HSM felt, +bowel sounds. Central nervous system: No gross focal neurologic deficits, normal speech Extremities: moves all, no edema, normal tone Psychiatry: normal mood, congruent affect, judgement and insight appear  normal  Labs   Data Reviewed: I have personally reviewed following labs and imaging studies  CBC: Recent Labs  Lab 12/05/19 1850 12/08/19 0544  WBC 6.8 7.1  NEUTROABS 4.6  --   HGB 11.8* 11.9*  HCT 37.4* 37.0*  MCV 95.4 94.4  PLT 187 267   Basic Metabolic Panel: Recent Labs  Lab 12/06/19 0908 12/07/19 0708 12/08/19 0544 12/09/19 0653 12/10/19 0355  NA 153* 146* 149* 146* 151*  K 5.2* 4.6 4.4 4.6 4.7  CL 118* 111 114* 113* 116*  CO2 23 25 25 25 26   GLUCOSE 126* 147* 137* 93 74  BUN 55* 57* 55* 61* 62*  CREATININE 2.84* 2.88* 2.88* 2.89* 2.70*  CALCIUM 9.3  9.0 8.6* 8.8* 8.9  MG  --   --  2.7*  --   --   PHOS  --   --  6.0*  --   --    GFR: Estimated Creatinine Clearance: 20.4 mL/min (A) (by C-G formula based on SCr of 2.7 mg/dL (H)). Liver Function Tests: No results for input(s): AST, ALT, ALKPHOS, BILITOT, PROT, ALBUMIN in the last 168 hours. No results for input(s): LIPASE, AMYLASE in the last 168 hours. No results for input(s): AMMONIA in the last 168 hours. Coagulation Profile: No results for input(s): INR, PROTIME in the last 168 hours. Cardiac Enzymes: No results for input(s): CKTOTAL, CKMB, CKMBINDEX, TROPONINI in the last 168 hours. BNP (last 3 results) No results for input(s): PROBNP in the last 8760 hours. HbA1C: No results for input(s): HGBA1C in the last 72 hours. CBG: Recent Labs  Lab 12/09/19 1726 12/09/19 2110 12/10/19 0757 12/10/19 0836 12/10/19 1157  GLUCAP 110* 143* 69* 72 162*   Lipid Profile: No results for input(s): CHOL, HDL, LDLCALC, TRIG, CHOLHDL, LDLDIRECT in the last 72 hours. Thyroid Function Tests: No results for input(s): TSH, T4TOTAL, FREET4, T3FREE, THYROIDAB in the last 72 hours. Anemia Panel: No results for input(s): VITAMINB12, FOLATE, FERRITIN, TIBC, IRON, RETICCTPCT in the last 72 hours. Sepsis Labs: No results for input(s): PROCALCITON, LATICACIDVEN in the last 168 hours.  Recent Results (from the past 240  hour(s))  MRSA PCR Screening     Status: None   Collection Time: 12/06/19  6:16 AM   Specimen: Nasopharyngeal  Result Value Ref Range Status   MRSA by PCR NEGATIVE NEGATIVE Final    Comment:        The GeneXpert MRSA Assay (FDA approved for NASAL specimens only), is one component of a comprehensive MRSA colonization surveillance program. It is not intended to diagnose MRSA infection nor to guide or monitor treatment for MRSA infections. Performed at [facility], [address] [place], [place] [address]   SARS Coronavirus 2 by RT PCR (hospital order, performed in [facility] hospital lab) Nasopharyngeal Nasopharyngeal Swab     Status: None   Collection Time: 12/06/19 11:44 PM   Specimen: Nasopharyngeal Swab  Result Value Ref Range Status   SARS Coronavirus 2 NEGATIVE NEGATIVE Final    Comment: (NOTE) SARS-CoV-2 target nucleic acids are NOT DETECTED.  The SARS-CoV-2 RNA is generally detectable in upper and lower respiratory specimens during the acute phase of infection. The lowest concentration of SARS-CoV-2 viral copies this assay can detect is 250 copies / mL. A negative result does not preclude SARS-CoV-2 infection and should not be used as the sole basis for treatment or other patient management decisions.  A negative result may occur with improper specimen collection / handling, submission of specimen other than nasopharyngeal swab, presence of viral mutation(s) within the areas targeted by this assay, and inadequate number of viral copies (<250 copies / mL). A negative result must be combined with clinical observations, patient history, and epidemiological information.  Fact Sheet for Patients:   StrictlyIdeas.no  Fact Sheet for Healthcare Providers: BankingDealers.co.za  This test is not yet approved or  cleared by the [place] FDA and has been authorized for detection and/or diagnosis of SARS-CoV-2  by FDA under an Emergency Use Authorization (EUA).  This EUA will remain in effect (meaning this test can be used) for the duration of the COVID-19 declaration under Section 564(b)(1) of the Act, 21 U.S.C. section 360bbb-3(b)(1), unless the authorization is terminated or revoked  sooner.  Performed at [facility], [address] [place], [place] [address]       Imaging Studies   No results found.   Medications   Scheduled Meds: . carvedilol  6.25 mg Oral BID WC  . dabigatran  75 mg Oral BID  . feeding supplement (ENSURE ENLIVE)  237 mL Oral BID BM  . insulin aspart  0-5 Units Subcutaneous QHS  . insulin aspart  0-9 Units Subcutaneous TID WC  . isosorbide mononitrate  30 mg Oral Daily  . memantine  5 mg Oral BID  . QUEtiapine  25 mg Oral QHS  . sodium chloride flush  3 mL Intravenous Q12H   Continuous Infusions: . sodium chloride         LOS: 5 days    Time spent: 42 minutes with >50% spent in coordination of care and direct patient contact.    Ezekiel Slocumb, DO [facility]  12/10/2019, 4:46 PM    If 7PM-7AM, please contact night-coverage. How to contact the [facility] Attending or Consulting provider [place] or covering provider during after hours [place], for this patient?    1. Check the care team in [facility] and look for a) attending/consulting [facility] provider listed and b) the [facility] team listed 2. Log into [email or web address] and use [place]'s universal password to access. If you do not have the password, please contact the hospital operator. 3. Locate the [facility] provider you are looking for under [facility] and page to a number that you can be directly reached. 4. If you still have difficulty reaching the provider, please page the [facility] (Director on Call) for the Hospitalists listed on amion for assistance.

## 2019-12-10 NOTE — Hospital Course (Addendum)
QUADIR MUNS is a 84 y.o. male  with medical history significant for DM, HTN, CKD 3a, dementia, HFrEF, last EF 25 to 30% in May 2021, A. fib on Pradaxa, who presented to the emergency room with generalized weakness and shortness of breath.   He was recently evaluated by his cardiologist as an outpatient and adjustments were made to his diuretics.  Lasix was changed to torsemide and Aldactone was added on 12/05/2019.  He was found to have acute kidney injury and hypernatremia, consistent with dehydration. He was given IV fluids for hydration. However, IV fluids were discontinued because of concern for fluid overload.   Cardiology and nephrology consulted.  Palliative care was consulted for goals of care discussion.   Met with family 7/30 and decision was made to make patient DNR.  Plan to return to SNF with palliative following a low threshold to transition to hospice depending on how he does.

## 2019-12-11 DIAGNOSIS — R531 Weakness: Secondary | ICD-10-CM | POA: Diagnosis not present

## 2019-12-11 LAB — BASIC METABOLIC PANEL
Anion gap: 8 (ref 5–15)
BUN: 61 mg/dL — ABNORMAL HIGH (ref 8–23)
CO2: 26 mmol/L (ref 22–32)
Calcium: 8.6 mg/dL — ABNORMAL LOW (ref 8.9–10.3)
Chloride: 115 mmol/L — ABNORMAL HIGH (ref 98–111)
Creatinine, Ser: 2.68 mg/dL — ABNORMAL HIGH (ref 0.61–1.24)
GFR calc Af Amer: 25 mL/min — ABNORMAL LOW (ref >60)
GFR calc non Af Amer: 21 mL/min — ABNORMAL LOW (ref >60)
Glucose, Bld: 173 mg/dL — ABNORMAL HIGH (ref 70–99)
Potassium: 4.4 mmol/L (ref 3.5–5.1)
Sodium: 149 mmol/L — ABNORMAL HIGH (ref 135–145)

## 2019-12-11 LAB — CBC
HCT: 38.6 % — ABNORMAL LOW (ref 39.0–52.0)
Hemoglobin: 12.3 g/dL — ABNORMAL LOW (ref 13.0–17.0)
MCH: 30.1 pg (ref 26.0–34.0)
MCHC: 31.9 g/dL (ref 30.0–36.0)
MCV: 94.6 fL (ref 80.0–100.0)
Platelets: 194 10*3/uL (ref 150–400)
RBC: 4.08 MIL/uL — ABNORMAL LOW (ref 4.22–5.81)
RDW: 16.5 % — ABNORMAL HIGH (ref 11.5–15.5)
WBC: 7.2 10*3/uL (ref 4.0–10.5)
nRBC: 0 % (ref 0.0–0.2)

## 2019-12-11 LAB — GLUCOSE, CAPILLARY
Glucose-Capillary: 144 mg/dL — ABNORMAL HIGH (ref 70–99)
Glucose-Capillary: 173 mg/dL — ABNORMAL HIGH (ref 70–99)
Glucose-Capillary: 211 mg/dL — ABNORMAL HIGH (ref 70–99)
Glucose-Capillary: 161 mg/dL — ABNORMAL HIGH (ref 70–99)

## 2019-12-11 LAB — MAGNESIUM: Magnesium: 3.1 mg/dL — ABNORMAL HIGH (ref 1.7–2.4)

## 2019-12-11 NOTE — Progress Notes (Signed)
Patient Name: Justin Manning Date of Encounter: 12/11/2019  Hospital Problem List     Principal Problem:   Generalized weakness Active Problems:   A-fib ([place])   Type 2 diabetes mellitus with stage 3 chronic kidney disease ([place])   Essential (primary) hypertension   Acute kidney injury superimposed on CKD (HCC)   Hypernatremia   Dementia with behavioral disturbance (HCC)   Hypoxia   Chronic anticoagulation    Patient Profile     84 y.o.malewith history ofdiabetes, hypertension, dementia, HFrEF with an EF of 25 to 30% by echo in 2021, atrial fibrillation treated with rate control and anticoagulated with Pradaxa, CKD who presented to the emergency room with weakness and shortness of breath progressing over the last several weeks. He recently was seen in our office. We made some slight adjuncts months to his diuretics including adding spironolactone and furosemide. Pulse ox was 90% per EMS improved to 95% on 2 L per nasal cannula. In the emergency room he was hemodynamically stable. BNP was 1679. Troponin was flat in the upper 40s. He is ruled out for myocardial infarction.   Subjective   Not able to get history due to confusion.   Inpatient Medications     carvedilol  6.25 mg Oral BID WC   dabigatran  75 mg Oral BID   feeding supplement (ENSURE ENLIVE)  237 mL Oral BID BM   insulin aspart  0-5 Units Subcutaneous QHS   insulin aspart  0-9 Units Subcutaneous TID WC   isosorbide mononitrate  30 mg Oral Daily   memantine  5 mg Oral BID   QUEtiapine  25 mg Oral QHS   sodium chloride flush  3 mL Intravenous Q12H    Vital Signs    Vitals:   12/10/19 2007 12/10/19 2100 12/11/19 0558 12/11/19 0739  BP: (!) 123/96  (!) 102/92 (!) 150/120  Pulse: (!) 111 96 79 86  Resp: 20  16 18   Temp: 98 F (36.7 C)  98 F (36.7 C) (!) 97.3 F (36.3 C)  TempSrc: Oral  Axillary Oral  SpO2:   93% 96%  Weight:   73 kg   Height:        Intake/Output Summary (Last 24  hours) at 12/11/2019 0853 Last data filed at 12/11/2019 0600 Gross per 24 hour  Intake 532.3 ml  Output 950 ml  Net -417.7 ml   Filed Weights   12/06/19 0619 12/10/19 0524 12/11/19 0558  Weight: 74.2 kg 73.5 kg 73 kg    Physical Exam    GEN: Well nourished, well developed, in no acute distress.  HEENT: normal.  Neck: Supple, no JVD, carotid bruits, or masses. Cardiac: RRR, no murmurs, rubs, or gallops. No clubbing, cyanosis, edema.  Radials/DP/PT 2+ and equal bilaterally.  Respiratory:  Respirations regular and unlabored, clear to auscultation bilaterally. GI: Soft, nontender, nondistended, BS + x 4. MS: no deformity or atrophy. Skin: warm and dry, no rash. Neuro:  Strength and sensation are intact. Psych: Normal affect.  Labs    CBC Recent Labs    12/11/19 0504  WBC 7.2  HGB 12.3*  HCT 38.6*  MCV 94.6  PLT 606   Basic Metabolic Panel Recent Labs    12/10/19 0355 12/11/19 0504  NA 151* 149*  K 4.7 4.4  CL 116* 115*  CO2 26 26  GLUCOSE 74 173*  BUN 62* 61*  CREATININE 2.70* 2.68*  CALCIUM 8.9 8.6*  MG  --  3.1*   Liver Function Tests  No results for input(s): AST, ALT, ALKPHOS, BILITOT, PROT, ALBUMIN in the last 72 hours. No results for input(s): LIPASE, AMYLASE in the last 72 hours. Cardiac Enzymes No results for input(s): CKTOTAL, CKMB, CKMBINDEX, TROPONINI in the last 72 hours. BNP No results for input(s): BNP in the last 72 hours. D-Dimer No results for input(s): DDIMER in the last 72 hours. Hemoglobin A1C No results for input(s): HGBA1C in the last 72 hours. Fasting Lipid Panel No results for input(s): CHOL, HDL, LDLCALC, TRIG, CHOLHDL, LDLDIRECT in the last 72 hours. Thyroid Function Tests No results for input(s): TSH, T4TOTAL, T3FREE, THYROIDAB in the last 72 hours.  Invalid input(s): FREET3  Telemetry    afib  ECG    afib  Radiology    CT HEAD WO CONTRAST  Result Date: 12/05/2019 CLINICAL DATA:  Altered mental status and shortness  of breath. EXAM: CT HEAD WITHOUT CONTRAST TECHNIQUE: Contiguous axial images were obtained from the base of the skull through the vertex without intravenous contrast. COMPARISON:  Sep 29, 2019 FINDINGS: Brain: There is moderate severity cerebral atrophy with widening of the extra-axial spaces and ventricular dilatation. There are areas of decreased attenuation within the white matter tracts of the supratentorial brain, consistent with microvascular disease changes. Vascular: No hyperdense vessel or unexpected calcification. Skull: Normal. Negative for fracture or focal lesion. Sinuses/Orbits: No acute finding. Other: None. IMPRESSION: 1. Generalized cerebral atrophy. 2. No acute intracranial abnormality. Electronically Signed   By: Virgina Norfolk M.D.   On: 12/05/2019 23:13   US RENAL  Result Date: 12/08/2019 CLINICAL DATA:  Acute kidney injury. EXAM: RENAL / URINARY TRACT ULTRASOUND COMPLETE COMPARISON:  01/09/2013 FINDINGS: Right Kidney: Renal measurements: 8.9 x 4.3 x 4.5 cm = volume: 89 mL . Echogenicity within normal limits. No mass or hydronephrosis visualized. Left Kidney: Renal measurements: 8.8 x 5.7 x 3.9 cm = volume: 100 mL. 11 mm cortical cyst noted interpolar region. No hydronephrosis. Bladder: Bladder is incompletely distended but bladder wall appears thickened and somewhat irregular despite the decompressed state. Other: Bilateral pleural effusions noted. Small volume intraperitoneal free fluid. IMPRESSION: 1. No hydronephrosis. 2. 11 mm left renal cortical cyst. Electronically Signed   By: Misty Stanley M.D.   On: 12/08/2019 10:46   DG Chest Port 1 View  Result Date: 12/08/2019 CLINICAL DATA:  Weakness, shortness of breath, wheezing EXAM: PORTABLE CHEST 1 VIEW COMPARISON:  12/05/2019 FINDINGS: Cardiomegaly. Unchanged small, layering bilateral pleural effusions. No focal airspace opacity. IMPRESSION: 1.  Cardiomegaly. 2. Unchanged small, layering bilateral pleural effusions. No focal  airspace opacity. Electronically Signed   By: Eddie Candle M.D.   On: 12/08/2019 09:25   DG Chest Portable 1 View  Result Date: 12/05/2019 CLINICAL DATA:  Dyspnea EXAM: PORTABLE CHEST 1 VIEW COMPARISON:  11/09/2019 FINDINGS: The lungs are symmetrically expanded. Interstitial pulmonary edema has improved since prior examination, though has not yet completely resolved. Small right pleural effusion now present. No pneumothorax. Mild to moderate cardiomegaly appears stable. No acute bone abnormality. IMPRESSION: Improving, but persistent cardiogenic failure. Electronically Signed   By: Fidela Salisbury MD   On: 12/05/2019 18:56    Assessment & Plan    84 y.o.malewith history ofdiabetes, hypertension, dementia, HFrEF with an EF of 25 to 30% by echo in 2021, atrial fibrillation treated with rate control and anticoagulated with Pradaxa, CKD who presented to the emergency room with weakness and shortness of breath progressing over the last several weeks.Note to have pulmonary edema on cxr which was improved from one  done several weeks earlier. BNP elevated to 1679 bnp from 1434 3 weeks earlier. Acute on chronic renal insufficiency with creatinine up to 2.71 with baseline of 1.6-1.7.   CHF-has HFrEF with ef of 25-30% by echo done 5/21. Moderate MR.CXR has shown cardiomegaly with small layering pleural effusions.  BNP down to 608  Afib-rate controlled with carvedilol and currently anticoagulated with pradaxa at low dose.  GFR is greater than 15.  We will continue with Pradaxa at 75 mg.GFR is greater than 15 so will continue with pradaxa. Continue with caarvedilol 6.25 bid,   CKD-as per above.  Renal function slightly improved today with a creatinine of 2.68 with gfr of 21.   CAD-remain on imdur and carvedilol at current dose. Defer asa due to pradaxa and intolerance.. Not candidate for statin. Elevated troponin not due to acs but due to demand and renal function.Continue with imdur and  carvedilol  Confusion-multifactoral   Signed, Chrissie Noa A. Tallie Hevia MD 12/11/2019, 8:53 AM  Pager: [phone]

## 2019-12-11 NOTE — TOC Initial Note (Addendum)
Transition of Care [facility]) - Initial/Assessment Note    Patient Details  Name: Justin Manning MRN: [id number] Date of Birth: 10/05/1935  Transition of Care [facility] CM/SW Contact:    Meriel Flavors, LCSW Phone Number: , 5:41 PM  Clinical Narrative:                 Pt to ED via [facility] from [facility] for shortness of breath.         Patient Goals and CMS Choice        Expected Discharge Plan and Services                                                Prior Living Arrangements/Services                       Activities of Daily Living   ADL Screening (condition at time of admission) Patient's cognitive ability adequate to safely complete daily activities?: No Is the patient deaf or have difficulty hearing?: No Does the patient have difficulty seeing, even when wearing glasses/contacts?: No Does the patient have difficulty concentrating, remembering, or making decisions?: Yes Patient able to express need for assistance with ADLs?: Yes Does the patient have difficulty dressing or bathing?: Yes Independently performs ADLs?: No Communication: Needs assistance Is this a change from baseline?: Pre-admission baseline Dressing (OT): Needs assistance Is this a change from baseline?: Pre-admission baseline Grooming: Needs assistance Is this a change from baseline?: Pre-admission baseline Feeding: Needs assistance Is this a change from baseline?: Pre-admission baseline Bathing: Needs assistance Is this a change from baseline?: Pre-admission baseline Toileting: Needs assistance Is this a change from baseline?: Pre-admission baseline In/Out Bed: Needs assistance Is this a change from baseline?: Pre-admission baseline Walks in Home: Needs assistance Is this a change from baseline?: Pre-admission baseline Does the patient have difficulty walking or climbing stairs?: Yes Weakness of Legs: None Weakness of Arms/Hands: Both  Permission Sought/Granted                   Emotional Assessment              Admission diagnosis:  Dehydration [E86.0] Acute on chronic renal insufficiency [N28.9, N18.9] Acute CHF (congestive heart failure) ([place]) [I50.9] Dementia without behavioral disturbance, unspecified dementia type ([place]) [F03.90] Chronic congestive heart failure, unspecified heart failure type ([place]) [I50.9] Patient Active Problem List   Diagnosis Date Noted  . Acute kidney injury superimposed on CKD ([place]) 12/05/2019  . Acute on chronic systolic CHF (congestive heart failure) ([place]) 12/05/2019  . Hypernatremia 12/05/2019  . Generalized weakness 12/05/2019  . Dementia with behavioral disturbance ([place]) 12/05/2019  . Hypoxia 12/05/2019  . Chronic anticoagulation 12/05/2019  . Acute CHF (congestive heart failure) ([place]) 12/05/2019  . Hypokalemia   . Elevated troponin   . Fall 09/29/2019  . History of alcoholism ([place]) 01/05/2017  . Hiatal hernia 04/11/2015  . Emphysema lung ([place]) 04/11/2015  . Protein malnutrition ([place]) 03/18/2015  . Anemia in chronic illness 11/09/2014  . Abdominal aortic atherosclerosis ([place]) 11/09/2014  . A-fib ([place]) 11/09/2014  . Atrial hypertrophy 11/09/2014  . Carotid arterial disease ([place]) 11/09/2014  . Chronic kidney disease (CKD), stage III (moderate) 11/09/2014  . Diabetes mellitus with renal manifestations, uncontrolled ([place]) 11/09/2014  . Diastolic dysfunction with heart failure ([place]) 11/09/2014  . Dysfunction of eustachian tube  11/09/2014  . Type 2 diabetes mellitus with stage 3 chronic kidney disease ([place]) 11/09/2014  . Essential (primary) hypertension 11/09/2014  . Gastro-esophageal reflux disease without esophagitis 11/09/2014  . Hearing loss of left ear 11/09/2014  . H/O carotid endarterectomy 11/09/2014  . H/O malignant neoplasm of colon 11/09/2014  . H/O infectious disease 11/09/2014  . H/O malignant neoplasm of prostate 11/09/2014  . Lung nodule, solitary 11/09/2014  . Mild cognitive  disorder 11/09/2014  . TI (tricuspid incompetence) 11/09/2014   PCP:  Steele Sizer, MD Pharmacy:   [facility], [place]. [place] [address] Phone: [phone] Fax: [phone]     Social Determinants of Health (SDOH) Interventions    Readmission Risk Interventions No flowsheet data found.

## 2019-12-11 NOTE — Progress Notes (Signed)
PROGRESS NOTE    RODMAN RECUPERO   ZES:[id number]  DOB: Nov 26, 1935  PCP: Steele Sizer, MD    DOA: 12/05/2019 LOS: 6   Brief Narrative   Justin Manning is a 84 y.o. male  with medical history significant for DM, HTN, CKD 3a, dementia, HFrEF, last EF 25 to 30% in May 2021, A. fib on Pradaxa, who presented to the emergency room with generalized weakness and shortness of breath.   He was recently evaluated by his cardiologist as an outpatient and adjustments were made to his diuretics.  Lasix was changed to torsemide and Aldactone was added on 12/05/2019.  He was found to have acute kidney injury and hypernatremia, consistent with dehydration. He was given IV fluids for hydration. However, IV fluids were discontinued because of concern for fluid overload.   Cardiology and nephrology consulted.  Palliative consult for goals of care discussion is pending at this time.     Assessment & Plan   Principal Problem:   Generalized weakness Active Problems:   A-fib (HCC)   Type 2 diabetes mellitus with stage 3 chronic kidney disease (HCC)   Essential (primary) hypertension   Acute kidney injury superimposed on CKD (HCC)   Hypernatremia   Dementia with behavioral disturbance (HCC)   Hypoxia   Chronic anticoagulation  Dyspnea - patient appeared to have increased work of breathing when seen this morning, but unable to appreciate crackles on exam.  Stop IV fluids.  Respiratory paged for breathing treatment.  Continue to monitor closely.  Low threshold to initiate low-dose morphine for air hunger.  Palliative meeting tomorrow morning.  HypERnatremia - present on admission, had improved with IV hydration but this was stopped due to concern for volume overload.  Patient has advanced dementia and is not taking in adequate oral hydration.  Sodium increased from 146 to 151 on 7/28, random gentle D5W overnight but patient appeared very dyspneic this morning so had to stop fluids.  Encourage patient  to drink water as much as possible.  Goals of care discussion is still pending.  Monitor BMP daily.  Acute kidney injury superimposed on CKD stage IIIa -AKI present on admission due to prerenal azotemia in the setting of dehydration.  Renal ultrasound on 7/26 was negative for obstruction or other abnormalities.  Avoid nephrotoxins and hypotension.  Renally dose meds as indicated.  Monitor BMP.  Chronic systolic CHF / valvular heart disease -not acutely decompensated.  Monitor volume and respiratory status closely with fluids running for hypernatremia.  Continue Coreg.  Lisinopril, torsemide and spironolactone are on hold due to AKI.  Cardiology following.  Acute versus chronic respiratory failure with hypoxia -ABG on 7/26 ruled out hypercarbia.  Continue supplemental oxygen to maintain O2 sat at or above 90%.  Atrial fibrillation -rate controlled.  Continue Pradaxa and Coreg  Insulin-dependent type 2 diabetes -continue Lantus and NovoLog  Dementia with behavioral disturbance -continue Seroquel and Namenda  Goals of care -prior attending physician had discussion with patient's daughter, [name] regarding his diagnoses and prognosis.  Palliative care was consulted and had plans to meet with patient's daughter on Friday at 9 AM.  Per report, prior attending had discussion with patient's daughter about hospice and she sounded amenable pending discussion with her brother.  Hospice is now consulted and palliative is signed off Laurann Montana NP).  Goals of care discussion is needed.      Patient BMI: Body mass index is 24.47 kg/m.   DVT prophylaxis:  dabigatran (PRADAXA) capsule 75 mg  Diet:  Diet Orders (From admission, onward)    Start     Ordered   12/08/19 1257  DIET - DYS 1 Room service appropriate? Yes; Fluid consistency: Thin  Diet effective now       Comments: Heart healthy  Question Answer Comment  Room service appropriate? Yes   Fluid consistency: Thin      12/08/19 1257             Code Status: Full Code    Subjective 12/11/19    Patient seen this morning at bedside.  No acute events reported overnight.  He is confused and at his baseline mental status.  He is unable to express any acute complaints.  Upon entering the room, patient appeared very dyspneic and with audible high-pitched wheezing.  I left the room to have respiratory paged, and when to return the wheezing had resolved and he appeared more comfortable.  Seems this might be due to upper airway secretions, as nursing staff has reported witnessing similar episodes that self resolved.  Palliative care confirmed meeting with patient's daughter tomorrow at 9 AM.   Disposition Plan & Communication   Status is: Inpatient  Remains inpatient appropriate because:Inpatient level of care appropriate due to severity of illness   Dispo: The patient is from: SNF              Anticipated d/c is to: SNF with palliative               Anticipated d/c date is: 2 days              Patient currently is not medically stable to d/c.     Family Communication: None at bedside during encounter, will attempt to call daughter   Consults, Procedures, Significant Events   Consultants:   Cardiology  Nephrology  Palliative Care  Procedures:   none  Antimicrobials:   none    Objective   Vitals:   12/10/19 2007 12/10/19 2100 12/11/19 0558 12/11/19 0739  BP: (!) 123/96  (!) 102/92 (!) 150/120  Pulse: (!) 111 96 79 86  Resp: 20  16 18   Temp: 98 F (36.7 C)  98 F (36.7 C) (!) 97.3 F (36.3 C)  TempSrc: Oral  Axillary Oral  SpO2:   93% 96%  Weight:   73 kg   Height:        Intake/Output Summary (Last 24 hours) at 12/11/2019 0826 Last data filed at 12/11/2019 0600 Gross per 24 hour  Intake 532.3 ml  Output 950 ml  Net -417.7 ml   Filed Weights   12/06/19 0619 12/10/19 0524 12/11/19 0558  Weight: 74.2 kg 73.5 kg 73 kg    Physical Exam:  General exam: awake, alert, no acute distress HEENT: dry  mucus membranes, hearing grossly normal  Respiratory system: CTAB on exam limited by patient mumbling, normal respiratory effort. Cardiovascular system: normal S1/S2, RRR, trace lower extremity edema.   Gastrointestinal system: soft, NT, ND, no HSM felt, +bowel sounds. Central nervous system: No gross focal neurologic deficits, normal speech Extremities: moves all, trace edema, normal tone   Labs   Data Reviewed: I have personally reviewed following labs and imaging studies  CBC: Recent Labs  Lab 12/05/19 1850 12/08/19 0544 12/11/19 0504  WBC 6.8 7.1 7.2  NEUTROABS 4.6  --   --   HGB 11.8* 11.9* 12.3*  HCT 37.4* 37.0* 38.6*  MCV 95.4 94.4 94.6  PLT 187 195 623   Basic Metabolic Panel: Recent  Labs  Lab 12/07/19 0708 12/08/19 0544 12/09/19 0653 12/10/19 0355 12/11/19 0504  NA 146* 149* 146* 151* 149*  K 4.6 4.4 4.6 4.7 4.4  CL 111 114* 113* 116* 115*  CO2 25 25 25 26 26   GLUCOSE 147* 137* 93 74 173*  BUN 57* 55* 61* 62* 61*  CREATININE 2.88* 2.88* 2.89* 2.70* 2.68*  CALCIUM 9.0 8.6* 8.8* 8.9 8.6*  MG  --  2.7*  --   --  3.1*  PHOS  --  6.0*  --   --   --    GFR: Estimated Creatinine Clearance: 20.6 mL/min (A) (by C-G formula based on SCr of 2.68 mg/dL (H)). Liver Function Tests: No results for input(s): AST, ALT, ALKPHOS, BILITOT, PROT, ALBUMIN in the last 168 hours. No results for input(s): LIPASE, AMYLASE in the last 168 hours. No results for input(s): AMMONIA in the last 168 hours. Coagulation Profile: No results for input(s): INR, PROTIME in the last 168 hours. Cardiac Enzymes: No results for input(s): CKTOTAL, CKMB, CKMBINDEX, TROPONINI in the last 168 hours. BNP (last 3 results) No results for input(s): PROBNP in the last 8760 hours. HbA1C: No results for input(s): HGBA1C in the last 72 hours. CBG: Recent Labs  Lab 12/10/19 0836 12/10/19 1157 12/10/19 1655 12/10/19 2120 12/11/19 0742  GLUCAP 72 162* 105* 221* 144*   Lipid Profile: No results for  input(s): CHOL, HDL, LDLCALC, TRIG, CHOLHDL, LDLDIRECT in the last 72 hours. Thyroid Function Tests: No results for input(s): TSH, T4TOTAL, FREET4, T3FREE, THYROIDAB in the last 72 hours. Anemia Panel: No results for input(s): VITAMINB12, FOLATE, FERRITIN, TIBC, IRON, RETICCTPCT in the last 72 hours. Sepsis Labs: No results for input(s): PROCALCITON, LATICACIDVEN in the last 168 hours.  Recent Results (from the past 240 hour(s))  MRSA PCR Screening     Status: None   Collection Time: 12/06/19  6:16 AM   Specimen: Nasopharyngeal  Result Value Ref Range Status   MRSA by PCR NEGATIVE NEGATIVE Final    Comment:        The GeneXpert MRSA Assay (FDA approved for NASAL specimens only), is one component of a comprehensive MRSA colonization surveillance program. It is not intended to diagnose MRSA infection nor to guide or monitor treatment for MRSA infections. Performed at [facility], [address] [place], [place] [address]   SARS Coronavirus 2 by RT PCR (hospital order, performed in [facility] hospital lab) Nasopharyngeal Nasopharyngeal Swab     Status: None   Collection Time: 12/06/19 11:44 PM   Specimen: Nasopharyngeal Swab  Result Value Ref Range Status   SARS Coronavirus 2 NEGATIVE NEGATIVE Final    Comment: (NOTE) SARS-CoV-2 target nucleic acids are NOT DETECTED.  The SARS-CoV-2 RNA is generally detectable in upper and lower respiratory specimens during the acute phase of infection. The lowest concentration of SARS-CoV-2 viral copies this assay can detect is 250 copies / mL. A negative result does not preclude SARS-CoV-2 infection and should not be used as the sole basis for treatment or other patient management decisions.  A negative result may occur with improper specimen collection / handling, submission of specimen other than nasopharyngeal swab, presence of viral mutation(s) within the areas targeted by this assay, and inadequate number of viral  copies (<250 copies / mL). A negative result must be combined with clinical observations, patient history, and epidemiological information.  Fact Sheet for Patients:   StrictlyIdeas.no  Fact Sheet for Healthcare Providers: BankingDealers.co.za  This test is not yet approved  or  cleared by the [place] and has been authorized for detection and/or diagnosis of SARS-CoV-2 by FDA under an Emergency Use Authorization (EUA).  This EUA will remain in effect (meaning this test can be used) for the duration of the COVID-19 declaration under Section 564(b)(1) of the Act, 21 U.S.C. section 360bbb-3(b)(1), unless the authorization is terminated or revoked sooner.  Performed at [facility], [address] [place], [place] [address]       Imaging Studies   No results found.   Medications   Scheduled Meds:  carvedilol  6.25 mg Oral BID WC   dabigatran  75 mg Oral BID   feeding supplement (ENSURE ENLIVE)  237 mL Oral BID BM   insulin aspart  0-5 Units Subcutaneous QHS   insulin aspart  0-9 Units Subcutaneous TID WC   isosorbide mononitrate  30 mg Oral Daily   memantine  5 mg Oral BID   QUEtiapine  25 mg Oral QHS   sodium chloride flush  3 mL Intravenous Q12H   Continuous Infusions:  sodium chloride     dextrose 50 mL/hr at 12/10/19 1807       LOS: 6 days    Time spent: 30 minutes    Ezekiel Slocumb, DO [facility]  12/11/2019, 8:26 AM    If 7PM-7AM, please contact night-coverage. How to contact the [facility] Attending or Consulting provider [place] or covering provider during after hours [place], for this patient?    1. Check the care team in [facility] and look for a) attending/consulting TR[facility] provider listed and b) the [facility] team listed 2. Log into [email or web address] and use [place]'s universal password to access. If you do not have the password, please contact the hospital operator. 3. Locate the  [facility] provider you are looking for under [facility] and page to a number that you can be directly reached. 4. If you still have difficulty reaching the provider, please page the [facility] (Director on Call) for the Hospitalists listed on amion for assistance.

## 2019-12-11 NOTE — TOC Initial Note (Signed)
Transition of Care [facility]) - Initial/Assessment Note    Patient Details  Name: Justin Manning MRN: [id number] Date of Birth: 24-Sep-1935  Transition of Care [facility]) CM/SW Contact:    Meriel Flavors, LCSW Phone Number: 12/07/2019, 5:38 PM  Clinical Narrative:                 Pt to ED via [facility] from [facility] for shortness of breath.         Patient Goals and CMS Choice        Expected Discharge Plan and Services                                                Prior Living Arrangements/Services                       Activities of Daily Living   ADL Screening (condition at time of admission) Patient's cognitive ability adequate to safely complete daily activities?: No Is the patient deaf or have difficulty hearing?: No Does the patient have difficulty seeing, even when wearing glasses/contacts?: No Does the patient have difficulty concentrating, remembering, or making decisions?: Yes Patient able to express need for assistance with ADLs?: Yes Does the patient have difficulty dressing or bathing?: Yes Independently performs ADLs?: No Communication: Needs assistance Is this a change from baseline?: Pre-admission baseline Dressing (OT): Needs assistance Is this a change from baseline?: Pre-admission baseline Grooming: Needs assistance Is this a change from baseline?: Pre-admission baseline Feeding: Needs assistance Is this a change from baseline?: Pre-admission baseline Bathing: Needs assistance Is this a change from baseline?: Pre-admission baseline Toileting: Needs assistance Is this a change from baseline?: Pre-admission baseline In/Out Bed: Needs assistance Is this a change from baseline?: Pre-admission baseline Walks in Home: Needs assistance Is this a change from baseline?: Pre-admission baseline Does the patient have difficulty walking or climbing stairs?: Yes Weakness of Legs: None Weakness of Arms/Hands: Both  Permission  Sought/Granted                  Emotional Assessment              Admission diagnosis:  Dehydration [E86.0] Acute on chronic renal insufficiency [N28.9, N18.9] Acute CHF (congestive heart failure) ([place]) [I50.9] Dementia without behavioral disturbance, unspecified dementia type ([place]) [F03.90] Chronic congestive heart failure, unspecified heart failure type ([place]) [I50.9] Patient Active Problem List   Diagnosis Date Noted  . Acute kidney injury superimposed on CKD ([place]) 12/05/2019  . Acute on chronic systolic CHF (congestive heart failure) ([place]) 12/05/2019  . Hypernatremia 12/05/2019  . Generalized weakness 12/05/2019  . Dementia with behavioral disturbance ([place]) 12/05/2019  . Hypoxia 12/05/2019  . Chronic anticoagulation 12/05/2019  . Acute CHF (congestive heart failure) ([place]) 12/05/2019  . Hypokalemia   . Elevated troponin   . Fall 09/29/2019  . History of alcoholism ([place]) 01/05/2017  . Hiatal hernia 04/11/2015  . Emphysema lung ([place]) 04/11/2015  . Protein malnutrition ([place]) 03/18/2015  . Anemia in chronic illness 11/09/2014  . Abdominal aortic atherosclerosis ([place]) 11/09/2014  . A-fib ([place]) 11/09/2014  . Atrial hypertrophy 11/09/2014  . Carotid arterial disease ([place]) 11/09/2014  . Chronic kidney disease (CKD), stage III (moderate) 11/09/2014  . Diabetes mellitus with renal manifestations, uncontrolled ([place]) 11/09/2014  . Diastolic dysfunction with heart failure ([place]) 11/09/2014  . Dysfunction of eustachian tube  11/09/2014  . Type 2 diabetes mellitus with stage 3 chronic kidney disease ([place]) 11/09/2014  . Essential (primary) hypertension 11/09/2014  . Gastro-esophageal reflux disease without esophagitis 11/09/2014  . Hearing loss of left ear 11/09/2014  . H/O carotid endarterectomy 11/09/2014  . H/O malignant neoplasm of colon 11/09/2014  . H/O infectious disease 11/09/2014  . H/O malignant neoplasm of prostate 11/09/2014  . Lung nodule, solitary 11/09/2014  .  Mild cognitive disorder 11/09/2014  . TI (tricuspid incompetence) 11/09/2014   PCP:  Steele Sizer, MD Pharmacy:   [facility], [address] [place] [address] Phone: [phone] Fax: [phone]     Social Determinants of Health (SDOH) Interventions    Readmission Risk Interventions No flowsheet data found.

## 2019-12-11 NOTE — Care Management Important Message (Signed)
Important Message  Patient Details  Name: Justin Manning MRN: [id number] Date of Birth: 14-Feb-1936   Medicare Important Message Given:  Yes     [name] 12/11/2019, 1:43 PM

## 2019-12-12 DIAGNOSIS — F0391 Unspecified dementia with behavioral disturbance: Secondary | ICD-10-CM | POA: Diagnosis not present

## 2019-12-12 DIAGNOSIS — N189 Chronic kidney disease, unspecified: Secondary | ICD-10-CM

## 2019-12-12 DIAGNOSIS — N179 Acute kidney failure, unspecified: Secondary | ICD-10-CM | POA: Diagnosis not present

## 2019-12-12 DIAGNOSIS — N289 Disorder of kidney and ureter, unspecified: Secondary | ICD-10-CM | POA: Diagnosis not present

## 2019-12-12 DIAGNOSIS — Z7189 Other specified counseling: Secondary | ICD-10-CM | POA: Diagnosis not present

## 2019-12-12 DIAGNOSIS — R531 Weakness: Secondary | ICD-10-CM | POA: Diagnosis not present

## 2019-12-12 DIAGNOSIS — Z515 Encounter for palliative care: Secondary | ICD-10-CM | POA: Diagnosis not present

## 2019-12-12 DIAGNOSIS — Z66 Do not resuscitate: Secondary | ICD-10-CM

## 2019-12-12 DIAGNOSIS — I509 Heart failure, unspecified: Secondary | ICD-10-CM | POA: Diagnosis not present

## 2019-12-12 DIAGNOSIS — E870 Hyperosmolality and hypernatremia: Secondary | ICD-10-CM | POA: Diagnosis not present

## 2019-12-12 LAB — BASIC METABOLIC PANEL
Anion gap: 9 (ref 5–15)
BUN: 65 mg/dL — ABNORMAL HIGH (ref 8–23)
CO2: 27 mmol/L (ref 22–32)
Calcium: 8.7 mg/dL — ABNORMAL LOW (ref 8.9–10.3)
Chloride: 116 mmol/L — ABNORMAL HIGH (ref 98–111)
Creatinine, Ser: 2.55 mg/dL — ABNORMAL HIGH (ref 0.61–1.24)
GFR calc Af Amer: 26 mL/min — ABNORMAL LOW (ref >60)
GFR calc non Af Amer: 23 mL/min — ABNORMAL LOW (ref >60)
Glucose, Bld: 154 mg/dL — ABNORMAL HIGH (ref 70–99)
Potassium: 4.7 mmol/L (ref 3.5–5.1)
Sodium: 152 mmol/L — ABNORMAL HIGH (ref 135–145)

## 2019-12-12 LAB — MAGNESIUM: Magnesium: 2.9 mg/dL — ABNORMAL HIGH (ref 1.7–2.4)

## 2019-12-12 LAB — GLUCOSE, CAPILLARY
Glucose-Capillary: 126 mg/dL — ABNORMAL HIGH (ref 70–99)
Glucose-Capillary: 165 mg/dL — ABNORMAL HIGH (ref 70–99)
Glucose-Capillary: 132 mg/dL — ABNORMAL HIGH (ref 70–99)
Glucose-Capillary: 191 mg/dL — ABNORMAL HIGH (ref 70–99)

## 2019-12-12 MED ORDER — DEXTROSE-NACL 5-0.45 % IV SOLN: INTRAVENOUS | Status: AC

## 2019-12-12 MED ORDER — DEXTROSE 5 % IV SOLN: INTRAVENOUS | Status: DC

## 2019-12-12 MED ORDER — CARVEDILOL 12.5 MG PO TABS
12.5000 mg | ORAL_TABLET | Freq: Two times a day (BID) | ORAL | Status: DC
  Administered 2019-12-12 – 2019-12-13 (×2): 12.5 mg via ORAL
  Filled 2019-12-12 (×2): qty 1

## 2019-12-12 NOTE — Progress Notes (Addendum)
PROGRESS NOTE    Justin Manning   SWH:[id number]  DOB: [birth year]  PCP: Steele Sizer, MD    DOA: 12/05/2019 LOS: 7   Brief Narrative   Justin Manning is a 84 y.o. male  with medical history significant for DM, HTN, CKD 3a, dementia, HFrEF, last EF 25 to 30% in May 2021, A. fib on Pradaxa, who presented to the emergency room with generalized weakness and shortness of breath.   He was recently evaluated by his cardiologist as an outpatient and adjustments were made to his diuretics.  Lasix was changed to torsemide and Aldactone was added on 12/05/2019.  He was found to have acute kidney injury and hypernatremia, consistent with dehydration. He was given IV fluids for hydration. However, IV fluids were discontinued because of concern for fluid overload.   Cardiology and nephrology consulted.  Palliative care was consulted for goals of care discussion.   Met with family 7/30 and decision was made to make patient DNR.  Plan to return to SNF with palliative following a low threshold to transition to hospice depending on how he does.     Assessment & Plan   Principal Problem:   Generalized weakness Active Problems:   A-fib (HCC)   Type 2 diabetes mellitus with stage 3 chronic kidney disease (HCC)   Essential (primary) hypertension   Acute kidney injury superimposed on CKD (HCC)   Hypernatremia   Dementia with behavioral disturbance (HCC)   Hypoxia   Chronic anticoagulation   Chronic congestive heart failure (HCC)   Acute on chronic renal insufficiency   Goals of care, counseling/discussion   Palliative care by specialist   DNR (do not resuscitate)  Dyspnea - seems resolved / intermittent per nursing and family.  Patient appeared to have increased work of breathing when seen 7/29 AM, but unable to appreciate crackles on exam.  Stopped D5W and monitor.  Continue to monitor closely.  Low threshold to initiate low-dose morphine for air hunger.      HypERnatremia - present on  admission, had improved with IV hydration but stopped due to concern for volume overload.  Patient has advanced dementia and is not taking in adequate oral hydration.  Encourage patient to drink water frequently.  Monitor BMP daily.  After speaking with family following palliative meeting, will resume gentle D5W-1/2NS today, try to optimize sodium level (as able without overloading him) prior to discharge.  Acute kidney injury superimposed on CKD stage IIIa -AKI present on admission due to prerenal azotemia in the setting of dehydration.  Baseline Cr 1.53 in June 2021.  Renal ultrasound on 7/26 was negative for obstruction or other abnormalities.  Avoid nephrotoxins and hypotension.  Renally dose meds as indicated.  Monitor BMP.  Cr improving slightly.  Gentle D5W-1/2NS as above.  Chronic systolic CHF / valvular heart disease -not acutely decompensated.  Monitor volume and respiratory status closely with fluids running for hypernatremia.  Continue Coreg.  Lisinopril, torsemide and spironolactone are on hold due to AKI.  Cardiology following.  Acute versus chronic respiratory failure with hypoxia -ABG on 7/26 ruled out hypercarbia.  Continue supplemental oxygen to maintain O2 sat at or above 90%.  Atrial fibrillation -rate controlled.  Continue Pradaxa and Coreg  Insulin-dependent type 2 diabetes -continue Lantus and NovoLog  Dementia with behavioral disturbance -continue Seroquel and Namenda  Goals of care -prior attending physician had discussion with patient's daughter, Justin Manning regarding his diagnoses and prognosis.  Palliative care was consulted and met with family today.  Patient made DNR.  Plan to return to SNF with palliative following.  Low threshold to transition to hospice.    Patient BMI: Body mass index is 24.14 kg/m.   DVT prophylaxis:  dabigatran (PRADAXA) capsule 75 mg   Diet:  Diet Orders (From admission, onward)    Start     Ordered   12/08/19 1257  DIET - DYS 1 Room service  appropriate? Yes; Fluid consistency: Thin  Diet effective now       Comments: Heart healthy  Question Answer Comment  Room service appropriate? Yes   Fluid consistency: Thin      12/08/19 1257            Code Status: DNR    Subjective 12/12/19    Patient seen this morning at bedside.  No acute events reported overnight.  He is in a pleasant mood today.  Denies any acute complaints.  Spoke with son and daughter at bedside after their meeting with palliative.  They would like to try to optimize his sodium and renal function if possible prior to returning to SNF.  They are considering hospice but want to give him time back at SNF to see how he does first.   Disposition Plan & Communication   Status is: Inpatient  Remains inpatient appropriate because:Inpatient level of care appropriate due to severity of illness   Dispo: The patient is from: SNF              Anticipated d/c is to: SNF with palliative               Anticipated d/c date is: 2 days              Patient currently is not medically stable to d/c.     Family Communication: Spoke with son and daughter at bedside this morning on rounds   Consults, Procedures, Significant Events   Consultants:   Cardiology  Nephrology  Palliative Care  Procedures:   none  Antimicrobials:   none    Objective   Vitals:   12/12/19 0522 12/12/19 0831 12/12/19 0848 12/12/19 1149  BP: (!) 141/102 (!) 168/147 (!) 138/101 (!) 109/58  Pulse: 101 90 95 88  Resp: '20  20 17  ' Temp: 98 F (36.7 C) (!) 97.5 F (36.4 C) 97.7 F (36.5 C) (!) 97.3 F (36.3 C)  TempSrc: Oral Oral Oral   SpO2: 94%  90% 100%  Weight: 72 kg     Height:        Intake/Output Summary (Last 24 hours) at 12/12/2019 1503 Last data filed at 12/12/2019 1500 Gross per 24 hour  Intake 535 ml  Output 0 ml  Net 535 ml   Filed Weights   12/10/19 0524 12/11/19 0558 12/12/19 0522  Weight: 73.5 kg 73 kg 72 kg    Physical Exam:  General exam: awake,  alert, no acute distress HEENT: moist mucus membranes, hearing grossly normal  Respiratory system: CTAB, normal respiratory effort. Cardiovascular system: normal S1/S2, RRR, no lower extremity edema.   Gastrointestinal system: soft, NT, ND Central nervous system: No gross focal neurologic deficits, normal speech Extremities: Has mittens on hands, moves all, no edema, normal tone   Labs   Data Reviewed: I have personally reviewed following labs and imaging studies  CBC: Recent Labs  Lab 12/05/19 1850 12/08/19 0544 12/11/19 0504  WBC 6.8 7.1 7.2  NEUTROABS 4.6  --   --   HGB 11.8* 11.9* 12.3*  HCT 37.4* 37.0*  38.6*  MCV 95.4 94.4 94.6  PLT 187 195 062   Basic Metabolic Panel: Recent Labs  Lab 12/08/19 0544 12/09/19 0653 12/10/19 0355 12/11/19 0504 12/12/19 0542  NA 149* 146* 151* 149* 152*  K 4.4 4.6 4.7 4.4 4.7  CL 114* 113* 116* 115* 116*  CO2 '25 25 26 26 27  ' GLUCOSE 137* 93 74 173* 154*  BUN 55* 61* 62* 61* 65*  CREATININE 2.88* 2.89* 2.70* 2.68* 2.55*  CALCIUM 8.6* 8.8* 8.9 8.6* 8.7*  MG 2.7*  --   --  3.1* 2.9*  PHOS 6.0*  --   --   --   --    GFR: Estimated Creatinine Clearance: 21.6 mL/min (A) (by C-G formula based on SCr of 2.55 mg/dL (H)). Liver Function Tests: No results for input(s): AST, ALT, ALKPHOS, BILITOT, PROT, ALBUMIN in the last 168 hours. No results for input(s): LIPASE, AMYLASE in the last 168 hours. No results for input(s): AMMONIA in the last 168 hours. Coagulation Profile: No results for input(s): INR, PROTIME in the last 168 hours. Cardiac Enzymes: No results for input(s): CKTOTAL, CKMB, CKMBINDEX, TROPONINI in the last 168 hours. BNP (last 3 results) No results for input(s): PROBNP in the last 8760 hours. HbA1C: No results for input(s): HGBA1C in the last 72 hours. CBG: Recent Labs  Lab 12/11/19 1115 12/11/19 1549 12/11/19 2128 12/12/19 0823 12/12/19 1148  GLUCAP 173* 211* 161* 126* 165*   Lipid Profile: No results for  input(s): CHOL, HDL, LDLCALC, TRIG, CHOLHDL, LDLDIRECT in the last 72 hours. Thyroid Function Tests: No results for input(s): TSH, T4TOTAL, FREET4, T3FREE, THYROIDAB in the last 72 hours. Anemia Panel: No results for input(s): VITAMINB12, FOLATE, FERRITIN, TIBC, IRON, RETICCTPCT in the last 72 hours. Sepsis Labs: No results for input(s): PROCALCITON, LATICACIDVEN in the last 168 hours.  Recent Results (from the past 240 hour(s))  MRSA PCR Screening     Status: None   Collection Time: 12/06/19  6:16 AM   Specimen: Nasopharyngeal  Result Value Ref Range Status   MRSA by PCR NEGATIVE NEGATIVE Final    Comment:        The GeneXpert MRSA Assay (FDA approved for NASAL specimens only), is one component of a comprehensive MRSA colonization surveillance program. It is not intended to diagnose MRSA infection nor to guide or monitor treatment for MRSA infections. Performed at [facility], [address] [place], [place] [address]   SARS Coronavirus 2 by RT PCR (hospital order, performed in [facility] hospital lab) Nasopharyngeal Nasopharyngeal Swab     Status: None   Collection Time: 12/06/19 11:44 PM   Specimen: Nasopharyngeal Swab  Result Value Ref Range Status   SARS Coronavirus 2 NEGATIVE NEGATIVE Final    Comment: (NOTE) SARS-CoV-2 target nucleic acids are NOT DETECTED.  The SARS-CoV-2 RNA is generally detectable in upper and lower respiratory specimens during the acute phase of infection. The lowest concentration of SARS-CoV-2 viral copies this assay can detect is 250 copies / mL. A negative result does not preclude SARS-CoV-2 infection and should not be used as the sole basis for treatment or other patient management decisions.  A negative result may occur with improper specimen collection / handling, submission of specimen other than nasopharyngeal swab, presence of viral mutation(s) within the areas targeted by this assay, and inadequate number of viral  copies (<250 copies / mL). A negative result must be combined with clinical observations, patient history, and epidemiological information.  Fact Sheet for Patients:  StrictlyIdeas.no  Fact Sheet for Healthcare Providers: BankingDealers.co.za  This test is not yet approved or  cleared by the [place] FDA and has been authorized for detection and/or diagnosis of SARS-CoV-2 by FDA under an Emergency Use Authorization (EUA).  This EUA will remain in effect (meaning this test can be used) for the duration of the COVID-19 declaration under Section 564(b)(1) of the Act, 21 U.S.C. section 360bbb-3(b)(1), unless the authorization is terminated or revoked sooner.  Performed at [facility], [address] [place], [place] [address]       Imaging Studies   No results found.   Medications   Scheduled Meds: . carvedilol  12.5 mg Oral BID WC  . dabigatran  75 mg Oral BID  . feeding supplement (ENSURE ENLIVE)  237 mL Oral BID BM  . insulin aspart  0-5 Units Subcutaneous QHS  . insulin aspart  0-9 Units Subcutaneous TID WC  . isosorbide mononitrate  30 mg Oral Daily  . memantine  5 mg Oral BID  . QUEtiapine  25 mg Oral QHS  . sodium chloride flush  3 mL Intravenous Q12H   Continuous Infusions: . sodium chloride    . dextrose 50 mL/hr at 12/12/19 1419       LOS: 7 days    Time spent: 30 minutes    Ezekiel Slocumb, DO [facility]  12/12/2019, 3:03 PM    If 7PM-7AM, please contact night-coverage. How to contact the [facility] Attending or Consulting provider [place] or covering provider during after hours [place], for this patient?    1. Check the care team in [facility] and look for a) attending/consulting TR[facility] provider listed and b) the [facility] team listed 2. Log into [email or web address] and use [facility] universal password to access. If you do not have the password, please contact the hospital operator. 3. Locate the  [facility] provider you are looking for under [facility] and page to a number that you can be directly reached. 4. If you still have difficulty reaching the provider, please page the [facility] (Director on Call) for the Hospitalists listed on amion for assistance.

## 2019-12-12 NOTE — Progress Notes (Signed)
Daily Progress Note   Patient Name: Justin Manning       Date: 12/12/2019 DOB: 05-Apr-1936  Age: 84 y.o. MRN#: [id number] Attending Physician: Ezekiel Slocumb, DO Primary Care Physician: Steele Sizer, MD Admit Date: 12/05/2019  Reason for Consultation/Follow-up: Establishing goals of care  Subjective: RN at bedside feeding patient - taking in most of breakfast well. No s/s of distress.   Length of Stay: 7  Current Medications: Scheduled Meds:  . carvedilol  12.5 mg Oral BID WC  . dabigatran  75 mg Oral BID  . feeding supplement (ENSURE ENLIVE)  237 mL Oral BID BM  . insulin aspart  0-5 Units Subcutaneous QHS  . insulin aspart  0-9 Units Subcutaneous TID WC  . isosorbide mononitrate  30 mg Oral Daily  . memantine  5 mg Oral BID  . QUEtiapine  25 mg Oral QHS  . sodium chloride flush  3 mL Intravenous Q12H    Continuous Infusions: . sodium chloride      PRN Meds: sodium chloride, acetaminophen, food thickener, ipratropium-albuterol, ondansetron (ZOFRAN) IV, sodium chloride flush  Physical Exam Constitutional:      General: He is not in acute distress.    Comments: Keeps eyes closed and no verbal interaction but does interact with surroundings  Pulmonary:     Effort: Pulmonary effort is normal.  Skin:    General: Skin is warm and dry.  Neurological:     Mental Status: He is disoriented.             Vital Signs: BP (!) 138/101 (BP Location: Left Arm)   Pulse 95   Temp 97.7 F (36.5 C) (Oral)   Resp 20   Ht 5' 8" (1.727 m)   Wt 72 kg   SpO2 90%   BMI 24.14 kg/m  SpO2: SpO2: 90 % O2 Device: O2 Device: Nasal Cannula O2 Flow Rate: O2 Flow Rate (L/min): 4 L/min  Intake/output summary:   Intake/Output Summary (Last 24 hours) at 12/12/2019 0900 Last data filed at 12/12/2019  0800 Gross per 24 hour  Intake 885.46 ml  Output 0 ml  Net 885.46 ml   LBM: Last BM Date: 12/11/19 Baseline Weight: Weight: 74.2 kg Most recent weight: Weight: 72 kg       Palliative Assessment/Data: PPS 30%      Patient Active Problem List   Diagnosis Date Noted  . Acute kidney injury superimposed on CKD ([place]) 12/05/2019  . Acute on chronic systolic CHF (congestive heart failure) ([place]) 12/05/2019  . Hypernatremia 12/05/2019  . Generalized weakness 12/05/2019  . Dementia with behavioral disturbance ([place]) 12/05/2019  . Hypoxia 12/05/2019  . Chronic anticoagulation 12/05/2019  . Acute CHF (congestive heart failure) ([place]) 12/05/2019  . Hypokalemia   . Elevated troponin   . Fall 09/29/2019  . History of alcoholism ([place]) 01/05/2017  . Hiatal hernia 04/11/2015  . Emphysema lung ([place]) 04/11/2015  . Protein malnutrition ([place]) 03/18/2015  . Anemia in chronic illness 11/09/2014  . Abdominal aortic atherosclerosis ([place]) 11/09/2014  . A-fib ([place]) 11/09/2014  . Atrial hypertrophy 11/09/2014  . Carotid arterial disease ([place]) 11/09/2014  . Chronic kidney disease (CKD), stage III (moderate) 11/09/2014  . Diabetes mellitus with  renal manifestations, uncontrolled ([place]) 11/09/2014  . Diastolic dysfunction with heart failure ([place]) 11/09/2014  . Dysfunction of eustachian tube 11/09/2014  . Type 2 diabetes mellitus with stage 3 chronic kidney disease ([place]) 11/09/2014  . Essential (primary) hypertension 11/09/2014  . Gastro-esophageal reflux disease without esophagitis 11/09/2014  . Hearing loss of left ear 11/09/2014  . H/O carotid endarterectomy 11/09/2014  . H/O malignant neoplasm of colon 11/09/2014  . H/O infectious disease 11/09/2014  . H/O malignant neoplasm of prostate 11/09/2014  . Lung nodule, solitary 11/09/2014  . Mild cognitive disorder 11/09/2014  . TI (tricuspid incompetence) 11/09/2014    Palliative Care Assessment & Plan   HPI: Justin Manning a 84  y.o.malewith medical history significant forDM, HTN, CKD 3, dementia, HFrEF, last EF 25 to 30% in May 2021, A. fib on Pradaxa, who presented to the emergency room with generalized weakness, and shortness of breath which has been progressive. Patient has been having his diuretic treatment adjusted. Found to have AKI and hypernatremia - given IV fluids, IV fluids stopped d/t concern of fluid overload. PMT consulted for [place].  Assessment: I have reviewed medical records including EPIC notes, labs and imaging, received report from Dr. Arbutus Ped, assessed the patient and then met with patient's son and daughter to discuss diagnosis prognosis, GOC, EOL wishes, disposition and options.  I introduced Palliative Medicine as specialized medical care for people living with serious illness. It focuses on providing relief from the symptoms and stress of a serious illness. The goal is to improve quality of life for both the patient and the family.  We discussed a brief life review of the patient. Patient is from [place] - wife passed away from cancer several years ago. He moved to [place] so that his daughter could help care for him. He has 3 children. 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 They describe him as someone who loves to make others laugh.  Family tells me he has been at [facility] for the past 2 months. He initially ambulated some - but for the past several weeks has been wheelchair bound. They tell me he has a good appetite but is dependent on others to feed him. We speak of his cognitive decline as well.    We discussed patient's current illness and what it means in the larger context of patient's on-going co-morbidities.  Natural disease trajectory and expectations at EOL were discussed. Discussed heart and kidney disease and careful balance to treat these together. Discussed chronic progressive nature of these diseases. Family expresses understanding.  Family expresses they are hopeful for "more  time" with the patient but want that time to be good time.   We discuss concern about repeated hospitalizations d/t to his chronic conditions and frailty. Family does express they would not want patient to experience frequent hospitalizations as this would be hard on him and not be what they would consider good quality of life. However, they are not yet ready to complete a MOST form specifying directives - they would first like to see how he does upon return to [facility].   Family brings of code status. Share their father has expressed wishes in the past for full code interventions; however, they are unsure he understood the implications of those wishes. Encouraged family to consider DNR/DNI status understanding evidenced based poor outcomes in similar hospitalized patients, as the cause of the arrest is likely associated with chronic/terminal disease rather than a reversible acute cardio-pulmonary event. Family agreed that DNR status was  appropriate.  Discussed with family the importance of continued conversation with family and the medical providers regarding overall plan of care and treatment options, ensuring decisions are within the context of the patient's values and GOCs.    Hospice and Palliative Care services outpatient were explained and offered. Family is interested in started palliative care services at [facility] with low threshold to transition to hospice care.  Questions and concerns were addressed. The family was encouraged to call with questions or concerns.   Recommendations/Plan:  Palliative care at [facility] - **Discharging MD, please write for this in discharge summary**  Code status changed to DNR  Family educated on disease process and prognosis  MOST form discussed, family not yet ready to complete - would like to see how he does upon return to [facility] - palliative at facility help family complete MOST  Code Status:  DNR  Prognosis:    Unable to determine  Discharge Planning:  [facility] for rehab with Palliative care service follow-up  Care plan was discussed with Dr. Arbutus Ped, patient's son and daughter  Thank you for allowing the Palliative Medicine Team to assist in the care of this patient.   Total Time 65 minutes  8:45-09:50 Prolonged Time Billed  yes       Greater than 50%  of this time was spent counseling and coordinating care related to the above assessment and plan.  Juel Burrow, DNP, [facility] Palliative Medicine Team Team Phone # [phone]  Pager [phone]

## 2019-12-12 NOTE — Progress Notes (Signed)
Palliative Medicine RN Note: Rec'd a call from patient's daughter Justin Manning [phone]). She reached out to the patient's facility to help ensure smooth transition back with palliative care. They let her know that they use [facility], and [facility] will need to send a referral to their central referral center [phone]. Justin Manning wants to be very sure that the discharge goes smoothly.  I reached out to Justin Manning's SW Justin Manning and discussed Justin Manning's concerns, as well as requested she call Justin Manning today to explain discharge process.   Justin Skiff Terie Lear, RN, BSN, [facility] Palliative Medicine Team 12/12/2019 1:53 PM Office [phone]

## 2019-12-12 NOTE — Progress Notes (Signed)
Patient Name: Justin Manning Date of Encounter: 12/12/2019  Hospital Problem List     Principal Problem:   Generalized weakness Active Problems:   A-fib ([place])   Type 2 diabetes mellitus with stage 3 chronic kidney disease (HCC)   Essential (primary) hypertension   Acute kidney injury superimposed on CKD (HCC)   Hypernatremia   Dementia with behavioral disturbance (HCC)   Hypoxia   Chronic anticoagulation    Patient Profile     84 y.o.malewith history ofdiabetes, hypertension, dementia, HFrEF with an EF of 25 to 30% by echo in 2021, atrial fibrillation treated with rate control and anticoagulated with Pradaxa, CKD who presented to the emergency room with weakness and shortness of breath progressing over the last several weeks. He recently was seen in our office. We made some slight adjuncts months to his diuretics including adding spironolactone and furosemide. Pulse ox was 90% per EMS improved to 95% on 2 L per nasal cannula. In the emergency room he was hemodynamically stable. BNP was 1679. Troponin was flat in the upper 40s.   Subjective     Inpatient Medications    . carvedilol  6.25 mg Oral BID WC  . dabigatran  75 mg Oral BID  . feeding supplement (ENSURE ENLIVE)  237 mL Oral BID BM  . insulin aspart  0-5 Units Subcutaneous QHS  . insulin aspart  0-9 Units Subcutaneous TID WC  . isosorbide mononitrate  30 mg Oral Daily  . memantine  5 mg Oral BID  . QUEtiapine  25 mg Oral QHS  . sodium chloride flush  3 mL Intravenous Q12H    Vital Signs    Vitals:   12/11/19 0739 12/11/19 1112 12/11/19 1548 12/12/19 0522  BP: (!) 150/120 (!) 114/62 (!) 125/105 (!) 141/102  Pulse: 86 105 85 101  Resp: 18 18 18 20   Temp: (!) 97.3 F (36.3 C) 97.8 F (36.6 C) 98 F (36.7 C) 98 F (36.7 C)  TempSrc: Oral Oral Oral Oral  SpO2: 96% 93% 92% 94%  Weight:    72 kg  Height:        Intake/Output Summary (Last 24 hours) at 12/12/2019 0758 Last data filed at 12/12/2019  0300 Gross per 24 hour  Intake 665.46 ml  Output 0 ml  Net 665.46 ml   Filed Weights   12/10/19 0524 12/11/19 0558 12/12/19 0522  Weight: 73.5 kg 73 kg 72 kg    Physical Exam      Labs    CBC Recent Labs    12/11/19 0504  WBC 7.2  HGB 12.3*  HCT 38.6*  MCV 94.6  PLT 073   Basic Metabolic Panel Recent Labs    12/11/19 0504 12/12/19 0542  NA 149* 152*  K 4.4 4.7  CL 115* 116*  CO2 26 27  GLUCOSE 173* 154*  BUN 61* 65*  CREATININE 2.68* 2.55*  CALCIUM 8.6* 8.7*  MG 3.1* 2.9*   Liver Function Tests No results for input(s): AST, ALT, ALKPHOS, BILITOT, PROT, ALBUMIN in the last 72 hours. No results for input(s): LIPASE, AMYLASE in the last 72 hours. Cardiac Enzymes No results for input(s): CKTOTAL, CKMB, CKMBINDEX, TROPONINI in the last 72 hours. BNP No results for input(s): BNP in the last 72 hours. D-Dimer No results for input(s): DDIMER in the last 72 hours. Hemoglobin A1C No results for input(s): HGBA1C in the last 72 hours. Fasting Lipid Panel No results for input(s): CHOL, HDL, LDLCALC, TRIG, CHOLHDL, LDLDIRECT in the last  72 hours. Thyroid Function Tests No results for input(s): TSH, T4TOTAL, T3FREE, THYROIDAB in the last 72 hours.  Invalid input(s): FREET3  Telemetry    afib  ECG    afib  Radiology    CT HEAD WO CONTRAST  Result Date: 12/05/2019 CLINICAL DATA:  Altered mental status and shortness of breath. EXAM: CT HEAD WITHOUT CONTRAST TECHNIQUE: Contiguous axial images were obtained from the base of the skull through the vertex without intravenous contrast. COMPARISON:  Sep 29, 2019 FINDINGS: Brain: There is moderate severity cerebral atrophy with widening of the extra-axial spaces and ventricular dilatation. There are areas of decreased attenuation within the white matter tracts of the supratentorial brain, consistent with microvascular disease changes. Vascular: No hyperdense vessel or unexpected calcification. Skull: Normal. Negative for  fracture or focal lesion. Sinuses/Orbits: No acute finding. Other: None. IMPRESSION: 1. Generalized cerebral atrophy. 2. No acute intracranial abnormality. Electronically Signed   By: Virgina Norfolk M.D.   On: 12/05/2019 23:13   US RENAL  Result Date: 12/08/2019 CLINICAL DATA:  Acute kidney injury. EXAM: RENAL / URINARY TRACT ULTRASOUND COMPLETE COMPARISON:  01/09/2013 FINDINGS: Right Kidney: Renal measurements: 8.9 x 4.3 x 4.5 cm = volume: 89 mL . Echogenicity within normal limits. No mass or hydronephrosis visualized. Left Kidney: Renal measurements: 8.8 x 5.7 x 3.9 cm = volume: 100 mL. 11 mm cortical cyst noted interpolar region. No hydronephrosis. Bladder: Bladder is incompletely distended but bladder wall appears thickened and somewhat irregular despite the decompressed state. Other: Bilateral pleural effusions noted. Small volume intraperitoneal free fluid. IMPRESSION: 1. No hydronephrosis. 2. 11 mm left renal cortical cyst. Electronically Signed   By: Misty Stanley M.D.   On: 12/08/2019 10:46   DG Chest Port 1 View  Result Date: 12/08/2019 CLINICAL DATA:  Weakness, shortness of breath, wheezing EXAM: PORTABLE CHEST 1 VIEW COMPARISON:  12/05/2019 FINDINGS: Cardiomegaly. Unchanged small, layering bilateral pleural effusions. No focal airspace opacity. IMPRESSION: 1.  Cardiomegaly. 2. Unchanged small, layering bilateral pleural effusions. No focal airspace opacity. Electronically Signed   By: Eddie Candle M.D.   On: 12/08/2019 09:25   DG Chest Portable 1 View  Result Date: 12/05/2019 CLINICAL DATA:  Dyspnea EXAM: PORTABLE CHEST 1 VIEW COMPARISON:  11/09/2019 FINDINGS: The lungs are symmetrically expanded. Interstitial pulmonary edema has improved since prior examination, though has not yet completely resolved. Small right pleural effusion now present. No pneumothorax. Mild to moderate cardiomegaly appears stable. No acute bone abnormality. IMPRESSION: Improving, but persistent cardiogenic failure.  Electronically Signed   By: Fidela Salisbury MD   On: 12/05/2019 18:56    Assessment & Plan    84 y.o.malewith history ofdiabetes, hypertension, dementia, HFrEF with an EF of 25 to 30% by echo in 2021, atrial fibrillation treated with rate control and anticoagulated with Pradaxa, CKD who presented to the emergency room with weakness and shortness of breath progressing over the last several weeks.Note to have pulmonary edema on cxr which was improved from one done several weeks earlier.  CHF-has HFrEF with ef of 25-30% by echo done 5/21. Moderate MR.CXR has shown cardiomegaly with small layering pleural effusions.   Afib-rate controlled with carvedilol and currently anticoagulated with pradaxa at low dose.GFR is greater than 15. We will continue with Pradaxa at 75 mg.GFR is greater than 15 so will continue with pradaxa. Continue with caarvedilol 6.25 bid,   CKD-as per above.Renal function slightly improved today with a creatinine of 2.55/gfr 26.  Na 152. Mg 2.9.   CAD-remain on imdur and  carvedilol at current dose. Defer asa due to pradaxa and intolerance.. Not candidate for statin. Elevated troponin not due to acs but due to demand and renal function.Continue with imdur and carvedilol  Confusion-multifactoral  Would agree with palliative care.   Dr. Nehemiah Massed will be following our patients this weekend  Signed, Javier Docker. Kalvyn Desa MD 12/12/2019, 7:58 AM  Pager: [phone]

## 2019-12-13 ENCOUNTER — Emergency Department
Admission: EM | Admit: 2019-12-13 | Discharge: 2019-12-13 | Disposition: A | Discharge status: Home / Self Care | Payer: Medicare Other | Source: Home / Self Care

## 2019-12-13 ENCOUNTER — Encounter: Payer: Self-pay | Admitting: Emergency Medicine

## 2019-12-13 ENCOUNTER — Other Ambulatory Visit: Payer: Self-pay

## 2019-12-13 DIAGNOSIS — F0390 Unspecified dementia without behavioral disturbance: Secondary | ICD-10-CM | POA: Insufficient documentation

## 2019-12-13 DIAGNOSIS — R7981 Abnormal blood-gas level: Secondary | ICD-10-CM

## 2019-12-13 DIAGNOSIS — Z87891 Personal history of nicotine dependence: Secondary | ICD-10-CM | POA: Insufficient documentation

## 2019-12-13 DIAGNOSIS — E1122 Type 2 diabetes mellitus with diabetic chronic kidney disease: Secondary | ICD-10-CM | POA: Insufficient documentation

## 2019-12-13 DIAGNOSIS — Z8546 Personal history of malignant neoplasm of prostate: Secondary | ICD-10-CM | POA: Insufficient documentation

## 2019-12-13 DIAGNOSIS — Z20822 Contact with and (suspected) exposure to covid-19: Secondary | ICD-10-CM | POA: Diagnosis not present

## 2019-12-13 DIAGNOSIS — J9811 Atelectasis: Secondary | ICD-10-CM | POA: Diagnosis not present

## 2019-12-13 DIAGNOSIS — Z794 Long term (current) use of insulin: Secondary | ICD-10-CM | POA: Insufficient documentation

## 2019-12-13 DIAGNOSIS — N179 Acute kidney failure, unspecified: Secondary | ICD-10-CM | POA: Diagnosis not present

## 2019-12-13 DIAGNOSIS — E1129 Type 2 diabetes mellitus with other diabetic kidney complication: Secondary | ICD-10-CM | POA: Insufficient documentation

## 2019-12-13 DIAGNOSIS — Z79899 Other long term (current) drug therapy: Secondary | ICD-10-CM | POA: Insufficient documentation

## 2019-12-13 DIAGNOSIS — I4891 Unspecified atrial fibrillation: Secondary | ICD-10-CM | POA: Diagnosis not present

## 2019-12-13 DIAGNOSIS — T68XXXA Hypothermia, initial encounter: Secondary | ICD-10-CM | POA: Diagnosis not present

## 2019-12-13 DIAGNOSIS — R4182 Altered mental status, unspecified: Secondary | ICD-10-CM | POA: Diagnosis not present

## 2019-12-13 DIAGNOSIS — Z7901 Long term (current) use of anticoagulants: Secondary | ICD-10-CM | POA: Insufficient documentation

## 2019-12-13 DIAGNOSIS — E872 Acidosis: Secondary | ICD-10-CM | POA: Diagnosis not present

## 2019-12-13 DIAGNOSIS — R0602 Shortness of breath: Secondary | ICD-10-CM | POA: Diagnosis not present

## 2019-12-13 DIAGNOSIS — R531 Weakness: Secondary | ICD-10-CM | POA: Diagnosis not present

## 2019-12-13 DIAGNOSIS — I5023 Acute on chronic systolic (congestive) heart failure: Secondary | ICD-10-CM | POA: Insufficient documentation

## 2019-12-13 DIAGNOSIS — I130 Hypertensive heart and chronic kidney disease with heart failure and stage 1 through stage 4 chronic kidney disease, or unspecified chronic kidney disease: Secondary | ICD-10-CM | POA: Insufficient documentation

## 2019-12-13 DIAGNOSIS — G319 Degenerative disease of nervous system, unspecified: Secondary | ICD-10-CM | POA: Diagnosis not present

## 2019-12-13 DIAGNOSIS — G9341 Metabolic encephalopathy: Secondary | ICD-10-CM | POA: Diagnosis not present

## 2019-12-13 DIAGNOSIS — N183 Chronic kidney disease, stage 3 unspecified: Secondary | ICD-10-CM | POA: Insufficient documentation

## 2019-12-13 DIAGNOSIS — E870 Hyperosmolality and hypernatremia: Secondary | ICD-10-CM | POA: Diagnosis not present

## 2019-12-13 DIAGNOSIS — F0391 Unspecified dementia with behavioral disturbance: Secondary | ICD-10-CM | POA: Diagnosis not present

## 2019-12-13 DIAGNOSIS — Z66 Do not resuscitate: Secondary | ICD-10-CM | POA: Diagnosis not present

## 2019-12-13 DIAGNOSIS — Z85038 Personal history of other malignant neoplasm of large intestine: Secondary | ICD-10-CM | POA: Insufficient documentation

## 2019-12-13 DIAGNOSIS — Z515 Encounter for palliative care: Secondary | ICD-10-CM | POA: Diagnosis not present

## 2019-12-13 DIAGNOSIS — A419 Sepsis, unspecified organism: Secondary | ICD-10-CM | POA: Diagnosis not present

## 2019-12-13 DIAGNOSIS — R4020 Unspecified coma: Secondary | ICD-10-CM | POA: Diagnosis not present

## 2019-12-13 LAB — BASIC METABOLIC PANEL
Anion gap: 9 (ref 5–15)
BUN: 61 mg/dL — ABNORMAL HIGH (ref 8–23)
CO2: 26 mmol/L (ref 22–32)
Calcium: 8.8 mg/dL — ABNORMAL LOW (ref 8.9–10.3)
Chloride: 115 mmol/L — ABNORMAL HIGH (ref 98–111)
Creatinine, Ser: 2.36 mg/dL — ABNORMAL HIGH (ref 0.61–1.24)
GFR calc Af Amer: 29 mL/min — ABNORMAL LOW (ref >60)
GFR calc non Af Amer: 25 mL/min — ABNORMAL LOW (ref >60)
Glucose, Bld: 198 mg/dL — ABNORMAL HIGH (ref 70–99)
Potassium: 4.3 mmol/L (ref 3.5–5.1)
Sodium: 150 mmol/L — ABNORMAL HIGH (ref 135–145)

## 2019-12-13 LAB — GLUCOSE, CAPILLARY
Glucose-Capillary: 145 mg/dL — ABNORMAL HIGH (ref 70–99)
Glucose-Capillary: 132 mg/dL — ABNORMAL HIGH (ref 70–99)

## 2019-12-13 LAB — SARS CORONAVIRUS 2 BY RT PCR (HOSPITAL ORDER, PERFORMED IN ~~LOC~~ HOSPITAL LAB): SARS Coronavirus 2: NEGATIVE

## 2019-12-13 MED ORDER — IPRATROPIUM-ALBUTEROL 0.5-2.5 (3) MG/3ML IN SOLN: 3.0000 mL | Freq: Four times a day (QID) | RESPIRATORY_TRACT | Status: DC | PRN

## 2019-12-13 MED ORDER — ACETAMINOPHEN 325 MG PO TABS: 650.0000 mg | ORAL_TABLET | ORAL | Status: DC | PRN

## 2019-12-13 MED ORDER — CARVEDILOL 12.5 MG PO TABS: 12.5000 mg | ORAL_TABLET | Freq: Two times a day (BID) | ORAL | Status: DC

## 2019-12-13 MED ORDER — INSULIN ASPART 100 UNIT/ML ~~LOC~~ SOLN: 0.0000 [IU] | Freq: Three times a day (TID) | SUBCUTANEOUS | 0 refills | Status: DC

## 2019-12-13 MED ORDER — ENSURE ENLIVE PO LIQD: 237.0000 mL | Freq: Two times a day (BID) | ORAL | 12 refills | Status: DC

## 2019-12-13 MED ORDER — STARCH (THICKENING) PO POWD: 1.0000 g | ORAL | 0 refills | Status: DC | PRN

## 2019-12-13 NOTE — Progress Notes (Signed)
Patient Name: Justin Manning Date of Encounter: 12/13/2019  Hospital Problem List     Principal Problem:   Generalized weakness Active Problems:   A-fib ([place])   Type 2 diabetes mellitus with stage 3 chronic kidney disease (HCC)   Essential (primary) hypertension   Acute kidney injury superimposed on CKD (HCC)   Hypernatremia   Dementia with behavioral disturbance (HCC)   Hypoxia   Chronic anticoagulation   Chronic congestive heart failure (HCC)   Acute on chronic renal insufficiency   Goals of care, counseling/discussion   Palliative care by specialist   DNR (do not resuscitate)    Patient Profile     84 y.o.malewith history ofdiabetes, hypertension, dementia, HFrEF with an EF of 25 to 30% by echo in 2021, atrial fibrillation treated with rate control and anticoagulated with Pradaxa, CKD who presented to the emergency room with weakness and shortness of breath progressing over the last several weeks. He recently was seen in our office. We made some slight adjuncts months to his diuretics including adding spironolactone and furosemide. Pulse ox was 90% per EMS improved to 95% on 2 L per nasal cannula. In the emergency room he was hemodynamically stable. BNP was 1679. Troponin was flat in the upper 40s.   Subjective   Patient not conversant and unable to discuss any symptoms.  Nurses assessed that the patient has no significant changes at this time in his condition.  Heart rate control of 100 bpm and stable  Inpatient Medications    . carvedilol  12.5 mg Oral BID WC  . dabigatran  75 mg Oral BID  . feeding supplement (ENSURE ENLIVE)  237 mL Oral BID BM  . insulin aspart  0-5 Units Subcutaneous QHS  . insulin aspart  0-9 Units Subcutaneous TID WC  . isosorbide mononitrate  30 mg Oral Daily  . memantine  5 mg Oral BID  . QUEtiapine  25 mg Oral QHS  . sodium chloride flush  3 mL Intravenous Q12H    Vital Signs    Vitals:   12/12/19 1149 12/12/19 1543 12/12/19  1921 12/12/19 1931  BP: (!) 109/58 (!) 141/92 (!) 171/110 124/68  Pulse: 88 65 101 93  Resp: 17 18 16 18   Temp: (!) 97.3 F (36.3 C) (!) 97.3 F (36.3 C) 97.8 F (36.6 C) 97.9 F (36.6 C)  TempSrc:   Oral Oral  SpO2: 100% 100% 93% 95%  Weight:      Height:        Intake/Output Summary (Last 24 hours) at 12/13/2019 3875 Last data filed at 12/12/2019 1500 Gross per 24 hour  Intake 295 ml  Output --  Net 295 ml   Filed Weights   12/10/19 0524 12/11/19 0558 12/12/19 0522  Weight: 73.5 kg 73 kg 72 kg    Physical Exam      Labs    CBC Recent Labs    12/11/19 0504  WBC 7.2  HGB 12.3*  HCT 38.6*  MCV 94.6  PLT 643   Basic Metabolic Panel Recent Labs    12/11/19 0504 12/12/19 0542  NA 149* 152*  K 4.4 4.7  CL 115* 116*  CO2 26 27  GLUCOSE 173* 154*  BUN 61* 65*  CREATININE 2.68* 2.55*  CALCIUM 8.6* 8.7*  MG 3.1* 2.9*   Liver Function Tests No results for input(s): AST, ALT, ALKPHOS, BILITOT, PROT, ALBUMIN in the last 72 hours. No results for input(s): LIPASE, AMYLASE in the last 72 hours. Cardiac Enzymes  No results for input(s): CKTOTAL, CKMB, CKMBINDEX, TROPONINI in the last 72 hours. BNP No results for input(s): BNP in the last 72 hours. D-Dimer No results for input(s): DDIMER in the last 72 hours. Hemoglobin A1C No results for input(s): HGBA1C in the last 72 hours. Fasting Lipid Panel No results for input(s): CHOL, HDL, LDLCALC, TRIG, CHOLHDL, LDLDIRECT in the last 72 hours. Thyroid Function Tests No results for input(s): TSH, T4TOTAL, T3FREE, THYROIDAB in the last 72 hours.  Invalid input(s): FREET3  Telemetry    afib  ECG    afib  Radiology    CT HEAD WO CONTRAST  Result Date: 12/05/2019 CLINICAL DATA:  Altered mental status and shortness of breath. EXAM: CT HEAD WITHOUT CONTRAST TECHNIQUE: Contiguous axial images were obtained from the base of the skull through the vertex without intravenous contrast. COMPARISON:  Sep 29, 2019  FINDINGS: Brain: There is moderate severity cerebral atrophy with widening of the extra-axial spaces and ventricular dilatation. There are areas of decreased attenuation within the white matter tracts of the supratentorial brain, consistent with microvascular disease changes. Vascular: No hyperdense vessel or unexpected calcification. Skull: Normal. Negative for fracture or focal lesion. Sinuses/Orbits: No acute finding. Other: None. IMPRESSION: 1. Generalized cerebral atrophy. 2. No acute intracranial abnormality. Electronically Signed   By: Virgina Norfolk M.D.   On: 12/05/2019 23:13   US RENAL  Result Date: 12/08/2019 CLINICAL DATA:  Acute kidney injury. EXAM: RENAL / URINARY TRACT ULTRASOUND COMPLETE COMPARISON:  01/09/2013 FINDINGS: Right Kidney: Renal measurements: 8.9 x 4.3 x 4.5 cm = volume: 89 mL . Echogenicity within normal limits. No mass or hydronephrosis visualized. Left Kidney: Renal measurements: 8.8 x 5.7 x 3.9 cm = volume: 100 mL. 11 mm cortical cyst noted interpolar region. No hydronephrosis. Bladder: Bladder is incompletely distended but bladder wall appears thickened and somewhat irregular despite the decompressed state. Other: Bilateral pleural effusions noted. Small volume intraperitoneal free fluid. IMPRESSION: 1. No hydronephrosis. 2. 11 mm left renal cortical cyst. Electronically Signed   By: Misty Stanley M.D.   On: 12/08/2019 10:46   DG Chest Port 1 View  Result Date: 12/08/2019 CLINICAL DATA:  Weakness, shortness of breath, wheezing EXAM: PORTABLE CHEST 1 VIEW COMPARISON:  12/05/2019 FINDINGS: Cardiomegaly. Unchanged small, layering bilateral pleural effusions. No focal airspace opacity. IMPRESSION: 1.  Cardiomegaly. 2. Unchanged small, layering bilateral pleural effusions. No focal airspace opacity. Electronically Signed   By: Eddie Candle M.D.   On: 12/08/2019 09:25   DG Chest Portable 1 View  Result Date: 12/05/2019 CLINICAL DATA:  Dyspnea EXAM: PORTABLE CHEST 1 VIEW  COMPARISON:  11/09/2019 FINDINGS: The lungs are symmetrically expanded. Interstitial pulmonary edema has improved since prior examination, though has not yet completely resolved. Small right pleural effusion now present. No pneumothorax. Mild to moderate cardiomegaly appears stable. No acute bone abnormality. IMPRESSION: Improving, but persistent cardiogenic failure. Electronically Signed   By: Fidela Salisbury MD   On: 12/05/2019 18:56    Assessment & Plan    84 y.o.malewith history ofdiabetes, hypertension, dementia, HFrEF with an EF of 25 to 30% by echo in 2021, atrial fibrillation treated with rate control and anticoagulated with Pradaxa, CKD who presented to the emergency room with weakness and shortness of breath progressing over the last several weeks.Note to have pulmonary edema on cxr which was improved from one done several weeks earlier.  CHF-has HFrEF with ef of 25-30% by echo done 5/21. Moderate MR.CXR has shown cardiomegaly with small layering pleural effusions.   Afib-rate  controlled with carvedilol and currently anticoagulated with pradaxa at low dose.GFR is greater than 15. We will continue with Pradaxa at 75 mg.GFR is greater than 15 and is currently 24 today so will continue with pradaxa. Continue with caarvedilol 6.25 bid for heart rate control which appears to be relatively stable at this time based on telemetry,    CAD-remain on imdur and carvedilol at current dose. Defer asa due to pradaxa and intolerance.. Not candidate for statin. Elevated troponin not due to acs but due to demand and renal function.Continue with imdur and carvedilol  Confusion-multifactoral  Would agree with palliative care.   No further cardiac diagnostics and/or treatment options other than continuation of medication management as per above.  Okay for discharge to home or to palliative care as treatment regimen and options listed above.  Please call if further questions and concerns or need and  adjustments of medication management  Serafina Royals, MD, [facility]

## 2019-12-13 NOTE — Discharge Summary (Signed)
Physician Discharge Summary  Justin Manning BWG:[id number] DOB: [birth year] DOA: 12/05/2019  PCP: Steele Sizer, MD  Admit date: 12/05/2019 Discharge date: 12/13/2019  Admitted From: SNF Disposition:  SNF with palliative care   Recommendations for Outpatient Follow-up:  1. Follow up with PCP in 1-2 weeks 2. Please obtain BMP/CBC in 2-3 days  3. Please follow up on with Palliative Care 4. Use oxygen as needed to keep O2 saturation >90%  Home Health: No  Equipment/Devices: None   Discharge Condition: Stable  CODE STATUS: DNR  Diet recommendation:  Dysphagia 1 (pureed), thickened liquids  Discharge Diagnoses: Principal Problem:   Generalized weakness Active Problems:   A-fib (HCC)   Type 2 diabetes mellitus with stage 3 chronic kidney disease (HCC)   Essential (primary) hypertension   Acute kidney injury superimposed on CKD (HCC)   Hypernatremia   Dementia with behavioral disturbance (HCC)   Hypoxia   Chronic anticoagulation   Chronic congestive heart failure ([place])   Acute on chronic renal insufficiency   Goals of care, counseling/discussion   Palliative care by specialist   DNR (do not resuscitate)    Summary of HPI and Hospital Course:  Justin Manning is a 84 y.o. male  with medical history significant for DM, HTN, CKD 3a, dementia, HFrEF, last EF 25 to 30% in May 2021, A. fib on Pradaxa, who presented to the emergency room with generalized weakness and shortness of breath.   He was recently evaluated by his cardiologist as an outpatient and adjustments were made to his diuretics.  Lasix was changed to torsemide and Aldactone was added on 12/05/2019.  He was found to have acute kidney injury and hypernatremia, consistent with dehydration. He was given IV fluids for hydration. However, IV fluids were discontinued because of concern for fluid overload.   Cardiology and nephrology consulted.  Palliative care was consulted for goals of care discussion.   Met with family  7/30 and decision was made to make patient DNR.  Plan to return to SNF with palliative following a low threshold to transition to hospice depending on how he does.    Dyspnea - seems resolved / intermittent per nursing and family.  Patient appeared to have increased work of breathing when seen 7/29 AM, lungs clear and stable oxygen requirement.  Continue to monitor closely.  Low threshold to initiate low-dose morphine for air hunger.      HypERnatremia - present on admission, had improved with IV hydration but due to patient low EF, he is unable to tolerate IV fluids at volumes needed to correct.  Patient has advanced dementia and is not taking in adequate food or drink at this point, as is seen with progressive dementia.  Encourage patient to drink water frequently.  Repeat BMP in 2-3 days.  Acute kidney injury superimposed on CKD stage IIIa -AKI present on admission due to prerenal azotemia in the setting of dehydration.  Baseline Cr 1.53 in June 2021.  Renal ultrasound on 7/26 was negative for obstruction or other abnormalities.  Avoid nephrotoxins and hypotension.  Renally dose meds as indicated.  Monitor BMP.  Cr improved slightly.  Does not tolerate IV fluids due to low EF.  This is likely pre-renal due to dehydration from inadequate PO intake.  Chronic systolic CHF / valvular heart disease -not acutely decompensated. Continue Coreg.  Lisinopril, torsemide and spironolactone are on hold due to AKI.  Cardiology following, agreed with palliative care.    Acute respiratory failure with hypoxia - ABG on  7/26 ruled out hypercarbia.  Continue supplemental oxygen to maintain O2 sat at or above 90%.  Atrial fibrillation -rate controlled.  Continue Pradaxa and Coreg  Insulin-dependent type 2 diabetes -Sliding scale Novolog   Dementia with behavioral disturbance -continue Seroquel and Namenda  Goals of care -prior attending physician had discussion with patient's daughter, [name] regarding his  diagnoses and prognosis.  Palliative care was consulted and met with family today.  Patient made DNR.  Plan to return to SNF with palliative following.  Low threshold to transition to hospice.   Spoke with daughter, [name], this morning prior to discharge.  Explained the situation, with IV fluids required to improve his sodium and kidney function, but low EF / heart failure, he does not tolerate this.  She questioned why he has not been eating or drinking.  Explained this to be the natural progression of dementia, and it commonly seen in these cases.  She expressed understanding and agreement, but is understandably upset that her father may need transition to hospice sooner than she had hoped.  She still wants to see how he does with palliative following at the facility.     Discharge Instructions   Discharge Instructions    (HEART FAILURE PATIENTS) Call MD:  Anytime you have any of the following symptoms: 1) 3 pound weight gain in 24 hours or 5 pounds in 1 week 2) shortness of breath, with or without a dry hacking cough 3) swelling in the hands, feet or stomach 4) if you have to sleep on extra pillows at night in order to breathe.   Complete by: As directed    Call MD for:  extreme fatigue   Complete by: As directed    Call MD for:  persistant dizziness or light-headedness   Complete by: As directed    Call MD for:  severe uncontrolled pain   Complete by: As directed    Call MD for:  temperature >100.4   Complete by: As directed    Diet - low sodium heart healthy   Complete by: As directed    Discharge instructions   Complete by: As directed    Patient REQUIRES frequent reminders to drink fluids.  Please encourage him to drink water frequently throughout the day.   Increase activity slowly   Complete by: As directed      Allergies as of 12/13/2019      Reactions   Aspirin    tachycardia      Medication List    STOP taking these medications   B-D ULTRAFINE III SHORT PEN 31G X 8 MM  Misc Generic drug: Insulin Pen Needle   furosemide 20 MG tablet Commonly known as: Lasix   hydrALAZINE 100 MG tablet Commonly known as: APRESOLINE   Lantus SoloStar 100 UNIT/ML Solostar Pen Generic drug: insulin glargine   lisinopril 20 MG tablet Commonly known as: ZESTRIL   potassium chloride SA 20 MEQ tablet Commonly known as: KLOR-CON   spironolactone 25 MG tablet Commonly known as: ALDACTONE   torsemide 20 MG tablet Commonly known as: DEMADEX     TAKE these medications   acetaminophen 325 MG tablet Commonly known as: TYLENOL Take 2 tablets (650 mg total) by mouth every 4 (four) hours as needed for headache or mild pain.   bisacodyl 5 MG EC tablet Commonly known as: DULCOLAX Take 1 tablet (5 mg total) by mouth daily as needed for moderate constipation.   carvedilol 12.5 MG tablet Commonly known as: COREG Take 1 tablet (  12.5 mg total) by mouth 2 (two) times daily with a meal. What changed:   medication strength  how much to take   Cholecalciferol 25 MCG (1000 UT) tablet Take 2,000 Units by mouth 2 (two) times daily.   feeding supplement (ENSURE ENLIVE) Liqd Take 237 mLs by mouth 2 (two) times daily between meals.   food thickener Powd Commonly known as: THICK IT Take 1 g by mouth as needed (dysphagia).   FREESTYLE LITE test strip Generic drug: glucose blood TEST BLOOD SUGAR THREE TIMES DAILY AS DIRECTED.Marland Kitchen   insulin aspart 100 UNIT/ML injection Commonly known as: novoLOG Inject 0-9 Units into the skin 3 (three) times daily with meals. Give 2 units for CBG 201-250, 3 units for CBG 251-300, 4 units for 301-350, 5 units for CBG 351-400, 7 units for CBG > 400 and call MD.   ipratropium-albuterol 0.5-2.5 (3) MG/3ML Soln Commonly known as: DUONEB Take 3 mLs by nebulization every 6 (six) hours as needed.   isosorbide mononitrate 30 MG 24 hr tablet Commonly known as: IMDUR Take 1 tablet (30 mg total) by mouth daily.   memantine 5 MG tablet Commonly known  as: NAMENDA TAKE 1 TABLET(5 MG) BY MOUTH TWICE DAILY What changed: See the new instructions.   multivitamin with minerals Tabs tablet Take 1 tablet by mouth daily.   ondansetron 4 MG tablet Commonly known as: ZOFRAN Take 1 tablet (4 mg total) by mouth every 6 (six) hours as needed for nausea.   polyethylene glycol 17 g packet Commonly known as: MIRALAX / GLYCOLAX Take 17 g by mouth daily as needed for mild constipation or moderate constipation.   Pradaxa 75 MG Caps capsule Generic drug: dabigatran Take 75 mg by mouth 2 (two) times daily.   QUEtiapine 25 MG tablet Commonly known as: SEROQUEL Take 1 tablet (25 mg total) by mouth at bedtime.   Vitamin B 12 100 MCG Lozg Take 100 mcg by mouth daily.       Follow-up Information    Kirk Ruths, MD In 3 days.   Specialty: Internal Medicine Why: for lab recheck Contact information: [place] [address] [phone]        Lavonia Dana, MD In 1 week.   Specialty: Nephrology Contact information: [address] [place] [address] [phone]        [facility].   Specialty: Emergency Medicine Why: If symptoms worsen Contact information: [place] [id number] [address] [place] [phone]       [place] Follow up on 12/25/2019.   Specialty: Cardiology Why: at [place] through the [place] entrance Contact information: [place]             Allergies  Allergen Reactions  . Aspirin     tachycardia    Consultations:  Cardiology  Nephrology  Palliative Care    Procedures/Studies: CT HEAD WO CONTRAST  Result Date: 12/05/2019 CLINICAL DATA:  Altered mental status and shortness of breath. EXAM: CT HEAD WITHOUT CONTRAST  TECHNIQUE: Contiguous axial images were obtained from the base of the skull through the vertex without intravenous contrast. COMPARISON:  Sep 29, 2019 FINDINGS: Brain: There is moderate severity cerebral atrophy with widening of the extra-axial spaces and ventricular dilatation. There are areas of decreased attenuation within the white matter tracts of the supratentorial brain,  consistent with microvascular disease changes. Vascular: No hyperdense vessel or unexpected calcification. Skull: Normal. Negative for fracture or focal lesion. Sinuses/Orbits: No acute finding. Other: None. IMPRESSION: 1. Generalized cerebral atrophy. 2. No acute intracranial abnormality. Electronically Signed   By: Virgina Norfolk M.D.   On: 12/05/2019 23:13   US RENAL  Result Date: 12/08/2019 CLINICAL DATA:  Acute kidney injury. EXAM: RENAL / URINARY TRACT ULTRASOUND COMPLETE COMPARISON:  01/09/2013 FINDINGS: Right Kidney: Renal measurements: 8.9 x 4.3 x 4.5 cm = volume: 89 mL . Echogenicity within normal limits. No mass or hydronephrosis visualized. Left Kidney: Renal measurements: 8.8 x 5.7 x 3.9 cm = volume: 100 mL. 11 mm cortical cyst noted interpolar region. No hydronephrosis. Bladder: Bladder is incompletely distended but bladder wall appears thickened and somewhat irregular despite the decompressed state. Other: Bilateral pleural effusions noted. Small volume intraperitoneal free fluid. IMPRESSION: 1. No hydronephrosis. 2. 11 mm left renal cortical cyst. Electronically Signed   By: Misty Stanley M.D.   On: 12/08/2019 10:46   DG Chest Port 1 View  Result Date: 12/08/2019 CLINICAL DATA:  Weakness, shortness of breath, wheezing EXAM: PORTABLE CHEST 1 VIEW COMPARISON:  12/05/2019 FINDINGS: Cardiomegaly. Unchanged small, layering bilateral pleural effusions. No focal airspace opacity. IMPRESSION: 1.  Cardiomegaly. 2. Unchanged small, layering bilateral pleural effusions. No focal airspace opacity. Electronically Signed   By:  Eddie Candle M.D.   On: 12/08/2019 09:25   DG Chest Portable 1 View  Result Date: 12/05/2019 CLINICAL DATA:  Dyspnea EXAM: PORTABLE CHEST 1 VIEW COMPARISON:  11/09/2019 FINDINGS: The lungs are symmetrically expanded. Interstitial pulmonary edema has improved since prior examination, though has not yet completely resolved. Small right pleural effusion now present. No pneumothorax. Mild to moderate cardiomegaly appears stable. No acute bone abnormality. IMPRESSION: Improving, but persistent cardiogenic failure. Electronically Signed   By: Fidela Salisbury MD   On: 12/05/2019 18:56       Subjective: Patient seen this AM, sleeping but woke easily to voice.  He denies any pain/discomfort, shortness of breath, or abdominal complaints.  Denies thirst or hunger.  No acute events overnight.  Patient is clinically stable to return to SNF with palliative following.   Discharge Exam: Vitals:   12/12/19 1931 12/13/19 0815  BP: 124/68 (!) 115/92  Pulse: 93 (!) 107  Resp: 18 18  Temp: 97.9 F (36.6 C) 97.8 F (36.6 C)  SpO2: 95% 90%   Vitals:   12/12/19 1543 12/12/19 1921 12/12/19 1931 12/13/19 0815  BP: (!) 141/92 (!) 171/110 124/68 (!) 115/92  Pulse: 65 101 93 (!) 107  Resp: _0 Temp: (!) 97.3 F (36.3 C) 97.8 F (36.6 C) 97.9 F (36.6 C) 97.8 F (36.6 C)  TempSrc:  Oral Oral Oral  SpO2: 100% 93% 95% 90%  Weight:      Height:        General: Pt is alert, awake, not in acute distress, frail, chronically ill appearing Cardiovascular: RRR, S1/S2 +, no rubs, no gallops, no edema Respiratory: CTA bilaterally, no wheezing, no rhonchi Abdominal: Soft, NT, ND, bowel sounds + Extremities: no edema, no cyanosis    The results of significant diagnostics from this hospitalization (including imaging, microbiology, ancillary and laboratory) are listed below for reference.     Microbiology: Recent Results (from the past 240 hour(s))  MRSA PCR Screening     Status: None   Collection  Time: 12/06/19  6:16 AM   Specimen: Nasopharyngeal  Result Value Ref Range Status  MRSA by PCR NEGATIVE NEGATIVE Final    Comment:        The GeneXpert MRSA Assay (FDA approved for NASAL specimens only), is one component of a comprehensive MRSA colonization surveillance program. It is not intended to diagnose MRSA infection nor to guide or monitor treatment for MRSA infections. Performed at [facility], [address] [place], [place] [address]   SARS Coronavirus 2 by RT PCR (hospital order, performed in [facility] hospital lab) Nasopharyngeal Nasopharyngeal Swab     Status: None   Collection Time: 12/06/19 11:44 PM   Specimen: Nasopharyngeal Swab  Result Value Ref Range Status   SARS Coronavirus 2 NEGATIVE NEGATIVE Final    Comment: (NOTE) SARS-CoV-2 target nucleic acids are NOT DETECTED.  The SARS-CoV-2 RNA is generally detectable in upper and lower respiratory specimens during the acute phase of infection. The lowest concentration of SARS-CoV-2 viral copies this assay can detect is 250 copies / mL. A negative result does not preclude SARS-CoV-2 infection and should not be used as the sole basis for treatment or other patient management decisions.  A negative result may occur with improper specimen collection / handling, submission of specimen other than nasopharyngeal swab, presence of viral mutation(s) within the areas targeted by this assay, and inadequate number of viral copies (<250 copies / mL). A negative result must be combined with clinical observations, patient history, and epidemiological information.  Fact Sheet for Patients:   StrictlyIdeas.no  Fact Sheet for Healthcare Providers: BankingDealers.co.za  This test is not yet approved or  cleared by the [place] FDA and has been authorized for detection and/or diagnosis of SARS-CoV-2 by FDA under an Emergency Use Authorization (EUA).  This EUA  will remain in effect (meaning this test can be used) for the duration of the COVID-19 declaration under Section 564(b)(1) of the Act, 21 U.S.C. section 360bbb-3(b)(1), unless the authorization is terminated or revoked sooner.  Performed at [facility], [address] [place], [place] [address]      Labs: BNP (last 3 results) Recent Labs    11/09/19 1817 12/05/19 1850 12/07/19 0511  BNP 1,434.9* 1,679.7* 250.0*   Basic Metabolic Panel: Recent Labs  Lab 12/08/19 0544 12/08/19 0544 12/09/19 3704 12/10/19 0355 12/11/19 0504 12/12/19 0542 12/13/19 0633  NA 149*   < > 146* 151* 149* 152* 150*  K 4.4   < > 4.6 4.7 4.4 4.7 4.3  CL 114*   < > 113* 116* 115* 116* 115*  CO2 25   < > _0 GLUCOSE 137*   < > 93 74 173* 154* 198*  BUN 55*   < > 61* 62* 61* 65* 61*  CREATININE 2.88*   < > 2.89* 2.70* 2.68* 2.55* 2.36*  CALCIUM 8.6*   < > 8.8* 8.9 8.6* 8.7* 8.8*  MG 2.7*  --   --   --  3.1* 2.9*  --   PHOS 6.0*  --   --   --   --   --   --    < > = values in this interval not displayed.   Liver Function Tests: No results for input(s): AST, ALT, ALKPHOS, BILITOT, PROT, ALBUMIN in the last 168 hours. No results for input(s): LIPASE, AMYLASE in the last 168 hours. No results for input(s): AMMONIA in the last 168 hours. CBC: Recent Labs  Lab 12/08/19 0544 12/11/19 0504  WBC 7.1 7.2  HGB 11.9* 12.3*  HCT 37.0* 38.6*  MCV  94.4 94.6  PLT 195 194   Cardiac Enzymes: No results for input(s): CKTOTAL, CKMB, CKMBINDEX, TROPONINI in the last 168 hours. BNP: Invalid input(s): POCBNP CBG: Recent Labs  Lab 12/12/19 0823 12/12/19 1148 12/12/19 1644 12/12/19 2154 12/13/19 0809  GLUCAP 126* 165* 132* 191* 145*   D-Dimer No results for input(s): DDIMER in the last 72 hours. Hgb A1c No results for input(s): HGBA1C in the last 72 hours. Lipid Profile No results for input(s): CHOL, HDL, LDLCALC, TRIG, CHOLHDL, LDLDIRECT in the last 72 hours. Thyroid  function studies No results for input(s): TSH, T4TOTAL, T3FREE, THYROIDAB in the last 72 hours.  Invalid input(s): FREET3 Anemia work up No results for input(s): VITAMINB12, FOLATE, FERRITIN, TIBC, IRON, RETICCTPCT in the last 72 hours. Urinalysis    Component Value Date/Time   COLORURINE YELLOW (A) 11/09/2019 2051   APPEARANCEUR HAZY (A) 11/09/2019 2051   APPEARANCEUR Hazy 01/09/2013 1604   LABSPEC 1.021 11/09/2019 2051   LABSPEC 1.014 01/09/2013 1604   PHURINE 5.0 11/09/2019 2051   GLUCOSEU NEGATIVE 11/09/2019 2051   GLUCOSEU 50 mg/dL 01/09/2013 1604   HGBUR NEGATIVE 11/09/2019 2051   BILIRUBINUR NEGATIVE 11/09/2019 2051   BILIRUBINUR neg 03/25/2019 1056   BILIRUBINUR Negative 01/09/2013 [place] 11/09/2019 2051   PROTEINUR 100 (A) 11/09/2019 2051   UROBILINOGEN 1.0 03/25/2019 1056   NITRITE NEGATIVE 11/09/2019 2051   LEUKOCYTESUR NEGATIVE 11/09/2019 2051   LEUKOCYTESUR Negative 01/09/2013 1604   Sepsis Labs Invalid input(s): PROCALCITONIN,  WBC,  LACTICIDVEN Microbiology Recent Results (from the past 240 hour(s))  MRSA PCR Screening     Status: None   Collection Time: 12/06/19  6:16 AM   Specimen: Nasopharyngeal  Result Value Ref Range Status   MRSA by PCR NEGATIVE NEGATIVE Final    Comment:        The GeneXpert MRSA Assay (FDA approved for NASAL specimens only), is one component of a comprehensive MRSA colonization surveillance program. It is not intended to diagnose MRSA infection nor to guide or monitor treatment for MRSA infections. Performed at [facility], [address] [place], [place] [address]   SARS Coronavirus 2 by RT PCR (hospital order, performed in [facility] hospital lab) Nasopharyngeal Nasopharyngeal Swab     Status: None   Collection Time: 12/06/19 11:44 PM   Specimen: Nasopharyngeal Swab  Result Value Ref Range Status   SARS Coronavirus 2 NEGATIVE NEGATIVE Final    Comment: (NOTE) SARS-CoV-2 target nucleic  acids are NOT DETECTED.  The SARS-CoV-2 RNA is generally detectable in upper and lower respiratory specimens during the acute phase of infection. The lowest concentration of SARS-CoV-2 viral copies this assay can detect is 250 copies / mL. A negative result does not preclude SARS-CoV-2 infection and should not be used as the sole basis for treatment or other patient management decisions.  A negative result may occur with improper specimen collection / handling, submission of specimen other than nasopharyngeal swab, presence of viral mutation(s) within the areas targeted by this assay, and inadequate number of viral copies (<250 copies / mL). A negative result must be combined with clinical observations, patient history, and epidemiological information.  Fact Sheet for Patients:   StrictlyIdeas.no  Fact Sheet for Healthcare Providers: BankingDealers.co.za  This test is not yet approved or  cleared by the [place] FDA and has been authorized for detection and/or diagnosis of SARS-CoV-2 by FDA under an Emergency Use Authorization (EUA).  This EUA will remain in effect (meaning this test can  be used) for the duration of the COVID-19 declaration under Section 564(b)(1) of the Act, 21 U.S.C. section 360bbb-3(b)(1), unless the authorization is terminated or revoked sooner.  Performed at [facility], [address] [place], [place] [address]      Time coordinating discharge: Over 30 minutes  SIGNED:   Ezekiel Slocumb, DO [facility] 12/13/2019, 10:29 AM   If 7PM-7AM, please contact night-coverage [email or web address]

## 2019-12-13 NOTE — ED Notes (Signed)
Spoke with Levada Dy at [facility] for report

## 2019-12-13 NOTE — Discharge Instructions (Addendum)
Please seek medical attention for any high fevers, chest pain, shortness of breath, change in behavior, persistent vomiting, bloody stool or any other new or concerning symptoms.  

## 2019-12-13 NOTE — ED Notes (Signed)
Pt unable to sign for d/c instructions. Pt has a hx of dementia. Attempted to get in touch with daughter with no success

## 2019-12-13 NOTE — ED Provider Notes (Signed)
[facility] Emergency Department Provider Note  ____________________________________________   I have reviewed the triage vital signs and the nursing notes.   HISTORY  Chief Complaint Low pulse ox reading  History limited by and level 5 caveat due to: Dementia   HPI Justin Manning is a 84 y.o. male who presents to the emergency department today via EMS from living facility because of concerns for low pulse oximetry reading.  EMS states that staff at living facility noted oxygen levels in the 50s.  However when EMS arrived and checked her oxygen level was in the 90s.  Patient had just been discharged from the hospital today.  Per chart review the patient does have history of dementia.  Additionally patient was on 4 L of oxygen.  Furthermore patient has had palliative care involved.     Records reviewed. Per medical record review patient has a history of discharge from hospital today.  Past Medical History:  Diagnosis Date  . Anemia   . CKD (chronic kidney disease), stage III    Dr. Juleen China  . Dementia ([place])   . Diabetes mellitus without complication ([place])   . GERD (gastroesophageal reflux disease)   . Hyperlipidemia   . Hypertension   . Mitral regurgitation    Mild    Patient Active Problem List   Diagnosis Date Noted  . Chronic congestive heart failure ([place])   . Acute on chronic renal insufficiency   . Goals of care, counseling/discussion   . Palliative care by specialist   . DNR (do not resuscitate)   . Acute kidney injury superimposed on CKD ([place]) 12/05/2019  . Acute on chronic systolic CHF (congestive heart failure) ([place]) 12/05/2019  . Hypernatremia 12/05/2019  . Generalized weakness 12/05/2019  . Dementia with behavioral disturbance ([place]) 12/05/2019  . Hypoxia 12/05/2019  . Chronic anticoagulation 12/05/2019  . Acute CHF (congestive heart failure) ([place]) 12/05/2019  . Hypokalemia   . Elevated troponin   . Fall 09/29/2019  . History of  alcoholism ([place]) 01/05/2017  . Hiatal hernia 04/11/2015  . Emphysema lung ([place]) 04/11/2015  . Protein malnutrition ([place]) 03/18/2015  . Anemia in chronic illness 11/09/2014  . Abdominal aortic atherosclerosis ([place]) 11/09/2014  . A-fib ([place]) 11/09/2014  . Atrial hypertrophy 11/09/2014  . Carotid arterial disease ([place]) 11/09/2014  . Chronic kidney disease (CKD), stage III (moderate) 11/09/2014  . Diabetes mellitus with renal manifestations, uncontrolled ([place]) 11/09/2014  . Diastolic dysfunction with heart failure ([place]) 11/09/2014  . Dysfunction of eustachian tube 11/09/2014  . Type 2 diabetes mellitus with stage 3 chronic kidney disease ([place]) 11/09/2014  . Essential (primary) hypertension 11/09/2014  . Gastro-esophageal reflux disease without esophagitis 11/09/2014  . Hearing loss of left ear 11/09/2014  . H/O carotid endarterectomy 11/09/2014  . H/O malignant neoplasm of colon 11/09/2014  . H/O infectious disease 11/09/2014  . H/O malignant neoplasm of prostate 11/09/2014  . Lung nodule, solitary 11/09/2014  . Mild cognitive disorder 11/09/2014  . TI (tricuspid incompetence) 11/09/2014    Past Surgical History:  Procedure Laterality Date  . ENDARTERECTOMY Left    Carotid  . KNEE SURGERY Right   . PARTIAL COLECTOMY     with Anastomosis  . SHOULDER SURGERY Right     Prior to Admission medications   Medication Sig Start Date End Date Taking? Authorizing Provider  acetaminophen (TYLENOL) 325 MG tablet Take 2 tablets (650 mg total) by mouth every 4 (four) hours as needed for headache or mild pain. 12/13/19   Ar[place] Ped,  Kelly A, DO  bisacodyl (DULCOLAX) 5 MG EC tablet Take 1 tablet (5 mg total) by mouth daily as needed for moderate constipation. 10/06/19   Lorella Nimrod, MD  carvedilol (COREG) 12.5 MG tablet Take 1 tablet (12.5 mg total) by mouth 2 (two) times daily with a meal. 12/13/19   Ezekiel Slocumb, DO  Cholecalciferol 25 MCG (1000 UT) tablet Take 2,000 Units by mouth 2 (two)  times daily.     [provider]  Cyanocobalamin (VITAMIN B 12) 100 MCG LOZG Take 100 mcg by mouth daily.     [provider]  dabigatran (PRADAXA) 75 MG CAPS capsule Take 75 mg by mouth 2 (two) times daily.     [provider]  feeding supplement, ENSURE ENLIVE, (ENSURE ENLIVE) LIQD Take 237 mLs by mouth 2 (two) times daily between meals. 12/13/19   Ezekiel Slocumb, DO  food thickener (THICK IT) POWD Take 1 g by mouth as needed (dysphagia). 12/13/19   Nicole Kindred A, DO  glucose blood (FREESTYLE LITE) test strip TEST BLOOD SUGAR THREE TIMES DAILY AS DIRECTED.. 11/11/18   Steele Sizer, MD  insulin aspart (NOVOLOG) 100 UNIT/ML injection Inject 0-9 Units into the skin 3 (three) times daily with meals. Give 2 units for CBG 201-250, 3 units for CBG 251-300, 4 units for 301-350, 5 units for CBG 351-400, 7 units for CBG > 400 and call MD. 12/13/19   Nicole Kindred A, DO  ipratropium-albuterol (DUONEB) 0.5-2.5 (3) MG/3ML SOLN Take 3 mLs by nebulization every 6 (six) hours as needed. 12/13/19   Ezekiel Slocumb, DO  isosorbide mononitrate (IMDUR) 30 MG 24 hr tablet Take 1 tablet (30 mg total) by mouth daily. 10/06/19   Lorella Nimrod, MD  memantine (NAMENDA) 5 MG tablet TAKE 1 TABLET(5 MG) BY MOUTH TWICE DAILY Patient taking differently: Take 5 mg by mouth 2 (two) times daily.  11/20/19   Steele Sizer, MD  Multiple Vitamin (MULTIVITAMIN WITH MINERALS) TABS tablet Take 1 tablet by mouth daily. 10/06/19   Lorella Nimrod, MD  ondansetron (ZOFRAN) 4 MG tablet Take 1 tablet (4 mg total) by mouth every 6 (six) hours as needed for nausea. 10/06/19   Lorella Nimrod, MD  polyethylene glycol (MIRALAX / GLYCOLAX) 17 g packet Take 17 g by mouth daily as needed for mild constipation or moderate constipation. 10/06/19   Lorella Nimrod, MD  QUEtiapine (SEROQUEL) 25 MG tablet Take 1 tablet (25 mg total) by mouth at bedtime. 10/06/19   Lorella Nimrod, MD    Allergies Aspirin  Family History   Problem Relation Age of Onset  . Alcohol abuse Brother   . Diabetes Daughter     Social History Social History   Tobacco Use  . Smoking status: Former Smoker    Packs/day: 1.00    Years: 10.00    Pack years: 10.00    Types: Cigarettes    Quit date: 05/15/1988    Years since quitting: 31.6  . Smokeless tobacco: Never Used  . Tobacco comment: smoking cessation materials not required  Vaping Use  . Vaping Use: Never used  Substance Use Topics  . Alcohol use: No    Alcohol/week: 0.0 standard drinks    Comment: used to be a heavy drinker but quit 40 years ago   . Drug use: Not Currently    Types: Marijuana    Comment: many years ago     Review of Systems Unable to obtain reliable review systems secondary to dementia ____________________________________________  PHYSICAL EXAM:  VITAL SIGNS: ED Triage Vitals  Enc Vitals Group     BP 12/13/19 1750 (!) 120/96     Pulse Rate 12/13/19 1750 78     Resp 12/13/19 1750 18     Temp 12/13/19 1750 (!) 97.5 F (36.4 C)     Temp Source 12/13/19 1750 Oral     SpO2 12/13/19 1750 100 %     Weight 12/13/19 1751 168 lb (76.2 kg)     Height 12/13/19 1751 5\' 8"  (1.727 m)    Constitutional: Awake and alert. Not completely oriented. Eyes: Conjunctivae are normal.  ENT      Head: Normocephalic and atraumatic.      Nose: No congestion/rhinnorhea.      Mouth/Throat: Mucous membranes are moist.      Neck: No stridor. Hematological/Lymphatic/Immunilogical: No cervical lymphadenopathy. Cardiovascular: Normal rate, regular rhythm.  No murmurs, rubs, or gallops.  Respiratory: Normal respiratory effort without tachypnea nor retractions. Breath sounds are clear and equal bilaterally. No wheezes/rales/rhonchi. Gastrointestinal: Soft and non tender. No rebound. No guarding.  Genitourinary: Deferred Musculoskeletal: Normal range of motion in all extremities. No lower extremity edema. Neurologic:  Demented. Moving all extremities. Skin:  Skin  is warm, dry and intact. No rash noted. Psychiatric: Mood and affect are normal. Speech and behavior are normal. Patient exhibits appropriate insight and judgment.  ____________________________________________    LABS (pertinent positives/negatives)  None  ____________________________________________   EKG  None  ____________________________________________    RADIOLOGY  None  ____________________________________________   PROCEDURES  Procedures  ____________________________________________   INITIAL IMPRESSION / ASSESSMENT AND PLAN / ED COURSE  Pertinent labs & imaging results that were available during my care of the patient were reviewed by me and considered in my medical decision making (see chart for details).   Patient brought to the emergency department by EMS because of concerns for low oxygen reading obtained at living facility.  Per EMS however patient's oxygen level within normal limits.  Here patient's oxygen level 100% on baseline O2.  At this time I do think that is likely a faulty O2 reading at living facility.  I did review the recent discharge summary and medical record.  It does appear that family would prefer patient to not have frequent hospitalizations and given that normal oxygen saturation here I do not feel any repeat work-up is necessary at this time.  Will discharge back to living facility.  ____________________________________________   FINAL CLINICAL IMPRESSION(S) / ED DIAGNOSES  Final diagnoses:  Abnormal pulse oximetry     Note: This dictation was prepared with Dragon dictation. Any transcriptional errors that result from this process are unintentional     Nance Pear, MD 12/13/19 1816

## 2019-12-13 NOTE — ED Triage Notes (Signed)
Pt via EMS from [facility]. Staff called out for breathing difficulty and O2 sat in the 50s. Per EMS, pt baseline 4L [place]. O2 sat maintained 98% and greater. On arrival, pt O2 sat is 100% on baseline O2. Pt is alert but disoriented. Pt has a hx of dementia.

## 2019-12-15 ENCOUNTER — Other Ambulatory Visit: Payer: Self-pay

## 2019-12-15 ENCOUNTER — Emergency Department: Payer: Medicare Other

## 2019-12-15 ENCOUNTER — Inpatient Hospital Stay
Admission: EM | Admit: 2019-12-15 | Discharge: 2019-12-17 | DRG: 070 | Disposition: A | Discharge status: Hospice | Payer: Medicare Other | Attending: Internal Medicine | Admitting: Internal Medicine

## 2019-12-15 DIAGNOSIS — Z886 Allergy status to analgesic agent status: Secondary | ICD-10-CM | POA: Diagnosis not present

## 2019-12-15 DIAGNOSIS — E860 Dehydration: Secondary | ICD-10-CM | POA: Diagnosis not present

## 2019-12-15 DIAGNOSIS — R0689 Other abnormalities of breathing: Secondary | ICD-10-CM | POA: Diagnosis not present

## 2019-12-15 DIAGNOSIS — E870 Hyperosmolality and hypernatremia: Secondary | ICD-10-CM | POA: Diagnosis present

## 2019-12-15 DIAGNOSIS — Z7901 Long term (current) use of anticoagulants: Secondary | ICD-10-CM

## 2019-12-15 DIAGNOSIS — E872 Acidosis, unspecified: Secondary | ICD-10-CM

## 2019-12-15 DIAGNOSIS — G319 Degenerative disease of nervous system, unspecified: Secondary | ICD-10-CM | POA: Diagnosis not present

## 2019-12-15 DIAGNOSIS — J439 Emphysema, unspecified: Secondary | ICD-10-CM | POA: Diagnosis present

## 2019-12-15 DIAGNOSIS — Z7189 Other specified counseling: Secondary | ICD-10-CM | POA: Diagnosis not present

## 2019-12-15 DIAGNOSIS — R69 Illness, unspecified: Secondary | ICD-10-CM | POA: Diagnosis not present

## 2019-12-15 DIAGNOSIS — Z9049 Acquired absence of other specified parts of digestive tract: Secondary | ICD-10-CM | POA: Diagnosis not present

## 2019-12-15 DIAGNOSIS — R000 Tachycardia, unspecified: Secondary | ICD-10-CM | POA: Diagnosis not present

## 2019-12-15 DIAGNOSIS — Z9981 Dependence on supplemental oxygen: Secondary | ICD-10-CM | POA: Diagnosis not present

## 2019-12-15 DIAGNOSIS — K219 Gastro-esophageal reflux disease without esophagitis: Secondary | ICD-10-CM | POA: Diagnosis present

## 2019-12-15 DIAGNOSIS — I5022 Chronic systolic (congestive) heart failure: Secondary | ICD-10-CM | POA: Diagnosis not present

## 2019-12-15 DIAGNOSIS — F1021 Alcohol dependence, in remission: Secondary | ICD-10-CM

## 2019-12-15 DIAGNOSIS — Z85038 Personal history of other malignant neoplasm of large intestine: Secondary | ICD-10-CM | POA: Diagnosis not present

## 2019-12-15 DIAGNOSIS — Z6828 Body mass index (BMI) 28.0-28.9, adult: Secondary | ICD-10-CM | POA: Diagnosis not present

## 2019-12-15 DIAGNOSIS — Z66 Do not resuscitate: Secondary | ICD-10-CM | POA: Diagnosis present

## 2019-12-15 DIAGNOSIS — I5042 Chronic combined systolic (congestive) and diastolic (congestive) heart failure: Secondary | ICD-10-CM | POA: Diagnosis not present

## 2019-12-15 DIAGNOSIS — D631 Anemia in chronic kidney disease: Secondary | ICD-10-CM | POA: Diagnosis present

## 2019-12-15 DIAGNOSIS — N184 Chronic kidney disease, stage 4 (severe): Secondary | ICD-10-CM | POA: Diagnosis not present

## 2019-12-15 DIAGNOSIS — E039 Hypothyroidism, unspecified: Secondary | ICD-10-CM | POA: Diagnosis not present

## 2019-12-15 DIAGNOSIS — I4821 Permanent atrial fibrillation: Secondary | ICD-10-CM | POA: Diagnosis present

## 2019-12-15 DIAGNOSIS — Z20822 Contact with and (suspected) exposure to covid-19: Secondary | ICD-10-CM | POA: Diagnosis present

## 2019-12-15 DIAGNOSIS — Z87891 Personal history of nicotine dependence: Secondary | ICD-10-CM

## 2019-12-15 DIAGNOSIS — A419 Sepsis, unspecified organism: Secondary | ICD-10-CM | POA: Diagnosis present

## 2019-12-15 DIAGNOSIS — I130 Hypertensive heart and chronic kidney disease with heart failure and stage 1 through stage 4 chronic kidney disease, or unspecified chronic kidney disease: Secondary | ICD-10-CM | POA: Diagnosis present

## 2019-12-15 DIAGNOSIS — Z8673 Personal history of transient ischemic attack (TIA), and cerebral infarction without residual deficits: Secondary | ICD-10-CM | POA: Diagnosis not present

## 2019-12-15 DIAGNOSIS — E785 Hyperlipidemia, unspecified: Secondary | ICD-10-CM | POA: Diagnosis present

## 2019-12-15 DIAGNOSIS — Z515 Encounter for palliative care: Secondary | ICD-10-CM

## 2019-12-15 DIAGNOSIS — R680 Hypothermia, not associated with low environmental temperature: Secondary | ICD-10-CM | POA: Diagnosis present

## 2019-12-15 DIAGNOSIS — I25119 Atherosclerotic heart disease of native coronary artery with unspecified angina pectoris: Secondary | ICD-10-CM | POA: Diagnosis not present

## 2019-12-15 DIAGNOSIS — G9341 Metabolic encephalopathy: Principal | ICD-10-CM | POA: Diagnosis present

## 2019-12-15 DIAGNOSIS — J9811 Atelectasis: Secondary | ICD-10-CM | POA: Diagnosis not present

## 2019-12-15 DIAGNOSIS — Z79899 Other long term (current) drug therapy: Secondary | ICD-10-CM

## 2019-12-15 DIAGNOSIS — J9691 Respiratory failure, unspecified with hypoxia: Secondary | ICD-10-CM | POA: Diagnosis not present

## 2019-12-15 DIAGNOSIS — F32A Depression, unspecified: Secondary | ICD-10-CM | POA: Diagnosis present

## 2019-12-15 DIAGNOSIS — R279 Unspecified lack of coordination: Secondary | ICD-10-CM | POA: Diagnosis not present

## 2019-12-15 DIAGNOSIS — E538 Deficiency of other specified B group vitamins: Secondary | ICD-10-CM | POA: Diagnosis not present

## 2019-12-15 DIAGNOSIS — I051 Rheumatic mitral insufficiency: Secondary | ICD-10-CM | POA: Diagnosis not present

## 2019-12-15 DIAGNOSIS — E1129 Type 2 diabetes mellitus with other diabetic kidney complication: Secondary | ICD-10-CM | POA: Diagnosis present

## 2019-12-15 DIAGNOSIS — E1122 Type 2 diabetes mellitus with diabetic chronic kidney disease: Secondary | ICD-10-CM | POA: Diagnosis present

## 2019-12-15 DIAGNOSIS — I340 Nonrheumatic mitral (valve) insufficiency: Secondary | ICD-10-CM | POA: Diagnosis present

## 2019-12-15 DIAGNOSIS — R4020 Unspecified coma: Secondary | ICD-10-CM | POA: Diagnosis present

## 2019-12-15 DIAGNOSIS — Z743 Need for continuous supervision: Secondary | ICD-10-CM | POA: Diagnosis not present

## 2019-12-15 DIAGNOSIS — E119 Type 2 diabetes mellitus without complications: Secondary | ICD-10-CM | POA: Diagnosis not present

## 2019-12-15 DIAGNOSIS — T68XXXA Hypothermia, initial encounter: Secondary | ICD-10-CM | POA: Diagnosis not present

## 2019-12-15 DIAGNOSIS — Z8546 Personal history of malignant neoplasm of prostate: Secondary | ICD-10-CM | POA: Diagnosis not present

## 2019-12-15 DIAGNOSIS — R1310 Dysphagia, unspecified: Secondary | ICD-10-CM | POA: Diagnosis not present

## 2019-12-15 DIAGNOSIS — I10 Essential (primary) hypertension: Secondary | ICD-10-CM | POA: Diagnosis present

## 2019-12-15 DIAGNOSIS — Z794 Long term (current) use of insulin: Secondary | ICD-10-CM | POA: Diagnosis not present

## 2019-12-15 DIAGNOSIS — I4891 Unspecified atrial fibrillation: Secondary | ICD-10-CM | POA: Diagnosis not present

## 2019-12-15 DIAGNOSIS — R001 Bradycardia, unspecified: Secondary | ICD-10-CM | POA: Diagnosis not present

## 2019-12-15 DIAGNOSIS — D649 Anemia, unspecified: Secondary | ICD-10-CM | POA: Diagnosis not present

## 2019-12-15 DIAGNOSIS — R0902 Hypoxemia: Secondary | ICD-10-CM | POA: Diagnosis not present

## 2019-12-15 DIAGNOSIS — F0391 Unspecified dementia with behavioral disturbance: Secondary | ICD-10-CM | POA: Diagnosis not present

## 2019-12-15 DIAGNOSIS — F1011 Alcohol abuse, in remission: Secondary | ICD-10-CM | POA: Diagnosis not present

## 2019-12-15 DIAGNOSIS — I959 Hypotension, unspecified: Secondary | ICD-10-CM | POA: Diagnosis not present

## 2019-12-15 DIAGNOSIS — F329 Major depressive disorder, single episode, unspecified: Secondary | ICD-10-CM | POA: Diagnosis present

## 2019-12-15 DIAGNOSIS — R4182 Altered mental status, unspecified: Secondary | ICD-10-CM | POA: Diagnosis not present

## 2019-12-15 DIAGNOSIS — F03918 Unspecified dementia, unspecified severity, with other behavioral disturbance: Secondary | ICD-10-CM | POA: Diagnosis present

## 2019-12-15 LAB — LACTIC ACID, PLASMA
Lactic Acid, Venous: 3 mmol/L (ref 0.5–1.9)
Lactic Acid, Venous: 3.6 mmol/L (ref 0.5–1.9)

## 2019-12-15 LAB — CBC WITH DIFFERENTIAL/PLATELET
Abs Immature Granulocytes: 0.02 10*3/uL (ref 0.00–0.07)
Basophils Absolute: 0 10*3/uL (ref 0.0–0.1)
Basophils Relative: 0 %
Eosinophils Absolute: 0.1 10*3/uL (ref 0.0–0.5)
Eosinophils Relative: 1 %
HCT: 40.4 % (ref 39.0–52.0)
Hemoglobin: 12.5 g/dL — ABNORMAL LOW (ref 13.0–17.0)
Immature Granulocytes: 0 %
Lymphocytes Relative: 14 %
Lymphs Abs: 1 10*3/uL (ref 0.7–4.0)
MCH: 29.7 pg (ref 26.0–34.0)
MCHC: 30.9 g/dL (ref 30.0–36.0)
MCV: 96 fL (ref 80.0–100.0)
Monocytes Absolute: 0.5 10*3/uL (ref 0.1–1.0)
Monocytes Relative: 7 %
Neutro Abs: 5.3 10*3/uL (ref 1.7–7.7)
Neutrophils Relative %: 78 %
Platelets: 193 10*3/uL (ref 150–400)
RBC: 4.21 MIL/uL — ABNORMAL LOW (ref 4.22–5.81)
RDW: 16.6 % — ABNORMAL HIGH (ref 11.5–15.5)
WBC: 6.9 10*3/uL (ref 4.0–10.5)
nRBC: 0.4 % — ABNORMAL HIGH (ref 0.0–0.2)

## 2019-12-15 LAB — AMMONIA: Ammonia: 13 umol/L (ref 9–35)

## 2019-12-15 LAB — URINALYSIS, COMPLETE (UACMP) WITH MICROSCOPIC
Bacteria, UA: NONE SEEN
Bilirubin Urine: NEGATIVE
Glucose, UA: NEGATIVE mg/dL
Hgb urine dipstick: NEGATIVE
Ketones, ur: NEGATIVE mg/dL
Leukocytes,Ua: NEGATIVE
Nitrite: NEGATIVE
Protein, ur: 100 mg/dL — AB
Specific Gravity, Urine: 1.019 (ref 1.005–1.030)
Squamous Epithelial / HPF: NONE SEEN (ref 0–5)
pH: 5 (ref 5.0–8.0)

## 2019-12-15 LAB — PROTIME-INR
INR: 2.1 — ABNORMAL HIGH (ref 0.8–1.2)
Prothrombin Time: 23 seconds — ABNORMAL HIGH (ref 11.4–15.2)

## 2019-12-15 LAB — MAGNESIUM: Magnesium: 2.9 mg/dL — ABNORMAL HIGH (ref 1.7–2.4)

## 2019-12-15 LAB — COMPREHENSIVE METABOLIC PANEL
ALT: 21 U/L (ref 0–44)
AST: 26 U/L (ref 15–41)
Albumin: 2.9 g/dL — ABNORMAL LOW (ref 3.5–5.0)
Alkaline Phosphatase: 60 U/L (ref 38–126)
Anion gap: 9 (ref 5–15)
BUN: 64 mg/dL — ABNORMAL HIGH (ref 8–23)
CO2: 23 mmol/L (ref 22–32)
Calcium: 8.4 mg/dL — ABNORMAL LOW (ref 8.9–10.3)
Chloride: 120 mmol/L — ABNORMAL HIGH (ref 98–111)
Creatinine, Ser: 2.59 mg/dL — ABNORMAL HIGH (ref 0.61–1.24)
GFR calc Af Amer: 26 mL/min — ABNORMAL LOW (ref >60)
GFR calc non Af Amer: 22 mL/min — ABNORMAL LOW (ref >60)
Glucose, Bld: 146 mg/dL — ABNORMAL HIGH (ref 70–99)
Potassium: 4.5 mmol/L (ref 3.5–5.1)
Sodium: 152 mmol/L — ABNORMAL HIGH (ref 135–145)
Total Bilirubin: 0.9 mg/dL (ref 0.3–1.2)
Total Protein: 6.1 g/dL — ABNORMAL LOW (ref 6.5–8.1)

## 2019-12-15 LAB — BRAIN NATRIURETIC PEPTIDE: B Natriuretic Peptide: 1575.9 pg/mL — ABNORMAL HIGH (ref 0.0–100.0)

## 2019-12-15 LAB — SARS CORONAVIRUS 2 BY RT PCR (HOSPITAL ORDER, PERFORMED IN ~~LOC~~ HOSPITAL LAB): SARS Coronavirus 2: NEGATIVE

## 2019-12-15 LAB — TSH: TSH: 2.173 u[IU]/mL (ref 0.350–4.500)

## 2019-12-15 LAB — APTT: aPTT: 67 seconds — ABNORMAL HIGH (ref 24–36)

## 2019-12-15 LAB — GLUCOSE, CAPILLARY
Glucose-Capillary: 121 mg/dL — ABNORMAL HIGH (ref 70–99)
Glucose-Capillary: 145 mg/dL — ABNORMAL HIGH (ref 70–99)

## 2019-12-15 MED ORDER — METOPROLOL TARTRATE 5 MG/5ML IV SOLN
2.5000 mg | INTRAVENOUS | Status: DC | PRN
  Administered 2019-12-16 (×2): 2.5 mg via INTRAVENOUS
  Filled 2019-12-15 (×2): qty 5

## 2019-12-15 MED ORDER — METRONIDAZOLE IN NACL 5-0.79 MG/ML-% IV SOLN
500.0000 mg | Freq: Once | INTRAVENOUS | Status: AC
  Administered 2019-12-15: 500 mg via INTRAVENOUS
  Filled 2019-12-15: qty 100

## 2019-12-15 MED ORDER — ALBUTEROL SULFATE (2.5 MG/3ML) 0.083% IN NEBU
2.5000 mg | INHALATION_SOLUTION | RESPIRATORY_TRACT | Status: DC | PRN
  Administered 2019-12-16: 2.5 mg via RESPIRATORY_TRACT
  Filled 2019-12-15: qty 3

## 2019-12-15 MED ORDER — INSULIN ASPART 100 UNIT/ML ~~LOC~~ SOLN: 0.0000 [IU] | Freq: Every day | SUBCUTANEOUS | Status: DC

## 2019-12-15 MED ORDER — DEXTROSE-NACL 5-0.45 % IV SOLN: INTRAVENOUS | Status: DC

## 2019-12-15 MED ORDER — HEPARIN SODIUM (PORCINE) 5000 UNIT/ML IJ SOLN
5000.0000 [IU] | Freq: Three times a day (TID) | INTRAMUSCULAR | Status: DC
  Administered 2019-12-15 – 2019-12-16 (×2): 5000 [IU] via SUBCUTANEOUS
  Filled 2019-12-15 (×3): qty 1

## 2019-12-15 MED ORDER — SODIUM CHLORIDE 0.9 % IV SOLN
2.0000 g | INTRAVENOUS | Status: DC
  Administered 2019-12-16: 2 g via INTRAVENOUS
  Filled 2019-12-15 (×2): qty 2

## 2019-12-15 MED ORDER — LACTATED RINGERS IV BOLUS (SEPSIS): 1000.0000 mL | Freq: Once | INTRAVENOUS | Status: DC

## 2019-12-15 MED ORDER — LACTATED RINGERS IV BOLUS (SEPSIS)
1000.0000 mL | Freq: Once | INTRAVENOUS | Status: AC
  Administered 2019-12-15: 1000 mL via INTRAVENOUS

## 2019-12-15 MED ORDER — INSULIN ASPART 100 UNIT/ML ~~LOC~~ SOLN
0.0000 [IU] | Freq: Three times a day (TID) | SUBCUTANEOUS | Status: DC
  Administered 2019-12-15: 1 [IU] via SUBCUTANEOUS
  Filled 2019-12-15: qty 1

## 2019-12-15 MED ORDER — LACTATED RINGERS IV SOLN: INTRAVENOUS | Status: DC

## 2019-12-15 MED ORDER — ACETAMINOPHEN 325 MG PO TABS: 650.0000 mg | ORAL_TABLET | Freq: Four times a day (QID) | ORAL | Status: DC | PRN

## 2019-12-15 MED ORDER — ACETAMINOPHEN 650 MG RE SUPP: 650.0000 mg | Freq: Four times a day (QID) | RECTAL | Status: DC | PRN

## 2019-12-15 MED ORDER — SODIUM CHLORIDE 0.9 % IV SOLN
2.0000 g | Freq: Once | INTRAVENOUS | Status: AC
  Administered 2019-12-15: 2 g via INTRAVENOUS
  Filled 2019-12-15: qty 2

## 2019-12-15 MED ORDER — VANCOMYCIN HCL 750 MG/150ML IV SOLN
750.0000 mg | INTRAVENOUS | Status: DC
  Administered 2019-12-15: 750 mg via INTRAVENOUS
  Filled 2019-12-15 (×2): qty 150

## 2019-12-15 MED ORDER — VANCOMYCIN HCL IN DEXTROSE 1-5 GM/200ML-% IV SOLN
1000.0000 mg | Freq: Once | INTRAVENOUS | Status: AC
  Administered 2019-12-15: 1000 mg via INTRAVENOUS
  Filled 2019-12-15: qty 200

## 2019-12-15 NOTE — Consult Note (Signed)
Pharmacy Antibiotic Note  Justin Manning is a 84 y.o. male with medical history including diabetes, hypertension, CKD stage 3a, dementia, HFrEF, atrial fibrillation on Pradaxa admitted on 12/15/2019 with sepsis with unclear source. Of note, patient was recently admitted 7/23 - 7/31 with AKI and hypernatremia. Does not appear patient received antibiotics during last admission. Pharmacy has been consulted for vancomycin and cefepime dosing.  Plan: Cefepime 2 g q24h (renally adjusted)  Patient received vancomycin 1 g in ED  Followed by maintenance regimen of 750 mg q24h per nomogram. Will order maintenance regimen to start today to complete loading dose.   Scr today is 2.59 which appears consistent with recent admission but above Scr of 1.5 - 1.6 in 09/2019. Un-clear what true baseline is.    Height: 5\' 8"  (172.7 cm) Weight: 76 kg (167 lb 8.8 oz) IBW/kg (Calculated) : 68.4  Temp (24hrs), Avg:95.5 F (35.3 C), Min:95.1 F (35.1 C), Max:96.4 F (35.8 C)  Recent Labs  Lab 12/10/19 0355 12/11/19 0504 12/12/19 0542 12/13/19 0633 12/15/19 1109 12/15/19 1250  WBC  --  7.2  --   --  6.9  --   CREATININE 2.70* 2.68* 2.55* 2.36* 2.59*  --   LATICACIDVEN  --   --   --   --  3.0* 3.6*    Estimated Creatinine Clearance: 21.3 mL/min (A) (by C-G formula based on SCr of 2.59 mg/dL (H)).    Allergies  Allergen Reactions   Aspirin     tachycardia    Antimicrobials this admission: Vancomycin, cefepime, and metronidazole x 1 in the ED on 8/2  Cefepime 8/2 >>  Vancomycin 8/2 >>   Dose adjustments this admission: n/a  Microbiology results: 8/2 BCx: pending 8/2 UCx: pending  8/2 MRSA PCR: pending  Thank you for allowing pharmacy to be a part of this patients care.  Benita Gutter 12/15/2019 4:31 PM

## 2019-12-15 NOTE — H&P (Signed)
History and Physical    KOAL ESLINGER CBS:[id number] DOB: [birth year] DOA: 12/15/2019  Referring MD/NP/PA:   PCP: Steele Sizer, MD   Patient coming from:  The patient is coming from SNF.  At baseline, pt is dependent for most of ADL.        Chief Complaint: Altered mental status, hypotension, hypothermia, tachycardia  HPI: Justin Manning is a 84 y.o. male with medical history significant of hypertension, hyperlipidemia, diabetes mellitus, dementia, GERD, depression, CKD stage IV, anemia, mitral regurgitation, CHF with EF 25-30%, atrial fibrillation on Pradaxa, anemia, who presents with altered mental status, hypotension, hypothermia, tachycardia.  Per her daughter (I called her daughter by phone), at her normal baseline, patient normally recognizes her and other family members, but most of the time he is not orientated to the place and time.  Today patient was noted to be more confused, not arousable.  Patient was found to have tachycardia and hypotension which cannot be mesured per her daughter.  Patient also has hypothermia.  His body temperature is 95 in ED.  Patient has cough, shortness breath, not sure if patient has any chest pain.  No active nausea, vomiting, diarrhea noted.  Per her daughter, patient does not seem to have abdominal pain.  Patient slightly moves extremities on painful stimuli.  Not sure if patient has symptoms of UTI.  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 CXR showed possible small pleural effusions and probable patchy bibasilar atelectasis.  Patient is admitted to progressive bed as inpatient.   Review of Systems: Could not be reviewed due to altered mental status  Allergy:  Allergies    Allergen Reactions  . Aspirin     tachycardia    Past Medical History:  Diagnosis Date  . Anemia   . CKD (chronic kidney disease), stage III    Dr. Juleen China  . Dementia ([place])   . Diabetes mellitus without complication ([place])   . GERD (gastroesophageal reflux disease)   . Hyperlipidemia   . Hypertension   . Mitral regurgitation    Mild    Past Surgical History:  Procedure Laterality Date  . ENDARTERECTOMY Left    Carotid  . KNEE SURGERY Right   . PARTIAL COLECTOMY     with Anastomosis  . SHOULDER SURGERY Right     Social History:  reports that he quit smoking about 31 years ago. His smoking use included cigarettes. He has a 10.00 pack-year smoking history. He has never used smokeless tobacco. He reports previous drug use. Drug: Marijuana. He reports that he does not drink alcohol.  Family History:  Family History  Problem Relation Age of Onset  . Alcohol abuse Brother   . Diabetes Daughter      Prior to Admission medications   Medication Sig Start Date End Date Taking? Authorizing Provider  acetaminophen (TYLENOL) 325 MG tablet Take 2 tablets (650 mg total) by mouth every 4 (four) hours as needed for headache or mild pain. 12/13/19   Ezekiel Slocumb, DO  bisacodyl (DULCOLAX) 5 MG EC tablet Take 1 tablet (5 mg total) by mouth daily as needed for moderate constipation. 10/06/19   Lorella Nimrod, MD  carvedilol (COREG) 12.5 MG tablet Take 1 tablet (12.5 mg total)  by mouth 2 (two) times daily with a meal. 12/13/19   Ezekiel Slocumb, DO  Cholecalciferol 25 MCG (1000 UT) tablet Take 2,000 Units by mouth 2 (two) times daily.     [provider]  Cyanocobalamin (VITAMIN B 12) 100 MCG LOZG Take 100 mcg by mouth daily.     [provider]  dabigatran (PRADAXA) 75 MG CAPS capsule Take 75 mg by mouth 2 (two) times daily.     [provider]  feeding supplement, ENSURE ENLIVE, (ENSURE ENLIVE) LIQD Take 237 mLs by mouth 2 (two) times daily between meals.  12/13/19   Ezekiel Slocumb, DO  food thickener (THICK IT) POWD Take 1 g by mouth as needed (dysphagia). 12/13/19   Nicole Kindred A, DO  glucose blood (FREESTYLE LITE) test strip TEST BLOOD SUGAR THREE TIMES DAILY AS DIRECTED.. 11/11/18   Steele Sizer, MD  insulin aspart (NOVOLOG) 100 UNIT/ML injection Inject 0-9 Units into the skin 3 (three) times daily with meals. Give 2 units for CBG 201-250, 3 units for CBG 251-300, 4 units for 301-350, 5 units for CBG 351-400, 7 units for CBG > 400 and call MD. 12/13/19   Nicole Kindred A, DO  ipratropium-albuterol (DUONEB) 0.5-2.5 (3) MG/3ML SOLN Take 3 mLs by nebulization every 6 (six) hours as needed. 12/13/19   Ezekiel Slocumb, DO  isosorbide mononitrate (IMDUR) 30 MG 24 hr tablet Take 1 tablet (30 mg total) by mouth daily. 10/06/19   Lorella Nimrod, MD  memantine (NAMENDA) 5 MG tablet TAKE 1 TABLET(5 MG) BY MOUTH TWICE DAILY Patient taking differently: Take 5 mg by mouth 2 (two) times daily.  11/20/19   Steele Sizer, MD  Multiple Vitamin (MULTIVITAMIN WITH MINERALS) TABS tablet Take 1 tablet by mouth daily. 10/06/19   Lorella Nimrod, MD  ondansetron (ZOFRAN) 4 MG tablet Take 1 tablet (4 mg total) by mouth every 6 (six) hours as needed for nausea. 10/06/19   Lorella Nimrod, MD  polyethylene glycol (MIRALAX / GLYCOLAX) 17 g packet Take 17 g by mouth daily as needed for mild constipation or moderate constipation. 10/06/19   Lorella Nimrod, MD  QUEtiapine (SEROQUEL) 25 MG tablet Take 1 tablet (25 mg total) by mouth at bedtime. 10/06/19   Lorella Nimrod, MD    Physical Exam: Vitals:   12/15/19 1418 12/15/19 1521 12/15/19 1736 12/15/19 1740  BP: (!) 145/82   (!) 147/110  Pulse: (!) 127 (!) 125 (!) 136 (!) 118  Resp: 14 (!) 28 (!) 30 18  Temp: (!) 95.9 F (35.5 C) (!) 96.4 F (35.8 C) (!) 97.3 F (36.3 C) (!) 97.3 F (36.3 C)  TempSrc: Bladder     SpO2: 100% 100% 100% 100%  Weight:      Height:       General: Not in acute distress.  Dry mucosal  membrane HEENT:       Eyes: PERRL, EOMI, no scleral icterus.       ENT: No discharge from the ears and nose.       Neck: No JVD, no bruit, no mass felt. Heme: No neck lymph node enlargement. Cardiac: S1/S2, RRR, No gallops or rubs. Respiratory: No rales, wheezing, rhonchi or rubs. GI: Soft, nondistended, nontender, no organomegaly, BS present. GU: No hematuria Ext: No pitting leg edema bilaterally. 2+DP/PT pulse bilaterally. Musculoskeletal: No joint deformities, No joint redness or warmth, no limitation of ROM in spin. Skin: No rashes.  Neuro: Patient is in comatose status, not arousable, not oriented X3, slightly  moves extremities on painful stimuli.  Psych: Patient is not psychotic, no suicidal or hemocidal ideation.  Labs on Admission: I have personally reviewed following labs and imaging studies  CBC: Recent Labs  Lab 12/11/19 0504 12/15/19 1109  WBC 7.2 6.9  NEUTROABS  --  5.3  HGB 12.3* 12.5*  HCT 38.6* 40.4  MCV 94.6 96.0  PLT 194 267   Basic Metabolic Panel: Recent Labs  Lab 12/10/19 0355 12/11/19 0504 12/12/19 0542 12/13/19 0633 12/15/19 1109  NA 151* 149* 152* 150* 152*  K 4.7 4.4 4.7 4.3 4.5  CL 116* 115* 116* 115* 120*  CO2 26 26 27 26 23   GLUCOSE 74 173* 154* 198* 146*  BUN 62* 61* 65* 61* 64*  CREATININE 2.70* 2.68* 2.55* 2.36* 2.59*  CALCIUM 8.9 8.6* 8.7* 8.8* 8.4*  MG  --  3.1* 2.9*  --  2.9*   GFR: Estimated Creatinine Clearance: 21.3 mL/min (A) (by C-G formula based on SCr of 2.59 mg/dL (H)). Liver Function Tests: Recent Labs  Lab 12/15/19 1109  AST 26  ALT 21  ALKPHOS 60  BILITOT 0.9  PROT 6.1*  ALBUMIN 2.9*   No results for input(s): LIPASE, AMYLASE in the last 168 hours. Recent Labs  Lab 12/15/19 1109  AMMONIA 13   Coagulation Profile: Recent Labs  Lab 12/15/19 1109  INR 2.1*   Cardiac Enzymes: No results for input(s): CKTOTAL, CKMB, CKMBINDEX, TROPONINI in the last 168 hours. BNP (last 3 results) No results for  input(s): PROBNP in the last 8760 hours. HbA1C: No results for input(s): HGBA1C in the last 72 hours. CBG: Recent Labs  Lab 12/12/19 1644 12/12/19 2154 12/13/19 0809 12/13/19 1121 12/15/19 1725  GLUCAP 132* 191* 145* 132* 121*   Lipid Profile: No results for input(s): CHOL, HDL, LDLCALC, TRIG, CHOLHDL, LDLDIRECT in the last 72 hours. Thyroid Function Tests: Recent Labs    12/15/19 1109  TSH 2.173   Anemia Panel: No results for input(s): VITAMINB12, FOLATE, FERRITIN, TIBC, IRON, RETICCTPCT in the last 72 hours. Urine analysis:    Component Value Date/Time   COLORURINE YELLOW (A) 12/15/2019 1109   APPEARANCEUR CLOUDY (A) 12/15/2019 1109   APPEARANCEUR Hazy 01/09/2013 1604   LABSPEC 1.019 12/15/2019 1109   LABSPEC 1.014 01/09/2013 1604   PHURINE 5.0 12/15/2019 1109   GLUCOSEU NEGATIVE 12/15/2019 1109   GLUCOSEU 50 mg/dL 01/09/2013 1604   HGBUR NEGATIVE 12/15/2019 1109   BILIRUBINUR NEGATIVE 12/15/2019 1109   BILIRUBINUR neg 03/25/2019 1056   BILIRUBINUR Negative 01/09/2013 1604   KETONESUR NEGATIVE 12/15/2019 1109   PROTEINUR 100 (A) 12/15/2019 1109   UROBILINOGEN 1.0 03/25/2019 1056   NITRITE NEGATIVE 12/15/2019 1109   LEUKOCYTESUR NEGATIVE 12/15/2019 1109   LEUKOCYTESUR Negative 01/09/2013 1604   Sepsis Labs: @LABRCNTIP (procalcitonin:4,lacticidven:4) ) Recent Results (from the past 240 hour(s))  MRSA PCR Screening     Status: None   Collection Time: 12/06/19  6:16 AM   Specimen: Nasopharyngeal  Result Value Ref Range Status   MRSA by PCR NEGATIVE NEGATIVE Final    Comment:        The GeneXpert MRSA Assay (FDA approved for NASAL specimens only), is one component of a comprehensive MRSA colonization surveillance program. It is not intended to diagnose MRSA infection nor to guide or monitor treatment for MRSA infections. Performed at [facility], [address] [place], [place] [address]   SARS Coronavirus 2 by RT PCR (hospital order,  performed in [place] lab) Nasopharyngeal Nasopharyngeal Swab  Status: None   Collection Time: 12/06/19 11:44 PM   Specimen: Nasopharyngeal Swab  Result Value Ref Range Status   SARS Coronavirus 2 NEGATIVE NEGATIVE Final    Comment: (NOTE) SARS-CoV-2 target nucleic acids are NOT DETECTED.  The SARS-CoV-2 RNA is generally detectable in upper and lower respiratory specimens during the acute phase of infection. The lowest concentration of SARS-CoV-2 viral copies this assay can detect is 250 copies / mL. A negative result does not preclude SARS-CoV-2 infection and should not be used as the sole basis for treatment or other patient management decisions.  A negative result may occur with improper specimen collection / handling, submission of specimen other than nasopharyngeal swab, presence of viral mutation(s) within the areas targeted by this assay, and inadequate number of viral copies (<250 copies / mL). A negative result must be combined with clinical observations, patient history, and epidemiological information.  Fact Sheet for Patients:   StrictlyIdeas.no  Fact Sheet for Healthcare Providers: BankingDealers.co.za  This test is not yet approved or  cleared by the [place] FDA and has been authorized for detection and/or diagnosis of SARS-CoV-2 by FDA under an Emergency Use Authorization (EUA).  This EUA will remain in effect (meaning this test can be used) for the duration of the COVID-19 declaration under Section 564(b)(1) of the Act, 21 U.S.C. section 360bbb-3(b)(1), unless the authorization is terminated or revoked sooner.  Performed at [facility], [address] [place], [place] [address]   SARS Coronavirus 2 by RT PCR (hospital order, performed in [facility] hospital lab) Nasopharyngeal Nasopharyngeal Swab     Status: None   Collection Time: 12/13/19 10:15 AM   Specimen: Nasopharyngeal Swab   Result Value Ref Range Status   SARS Coronavirus 2 NEGATIVE NEGATIVE Final    Comment: (NOTE) SARS-CoV-2 target nucleic acids are NOT DETECTED.  The SARS-CoV-2 RNA is generally detectable in upper and lower respiratory specimens during the acute phase of infection. The lowest concentration of SARS-CoV-2 viral copies this assay can detect is 250 copies / mL. A negative result does not preclude SARS-CoV-2 infection and should not be used as the sole basis for treatment or other patient management decisions.  A negative result may occur with improper specimen collection / handling, submission of specimen other than nasopharyngeal swab, presence of viral mutation(s) within the areas targeted by this assay, and inadequate number of viral copies (<250 copies / mL). A negative result must be combined with clinical observations, patient history, and epidemiological information.  Fact Sheet for Patients:   StrictlyIdeas.no  Fact Sheet for Healthcare Providers: BankingDealers.co.za  This test is not yet approved or  cleared by the [place] FDA and has been authorized for detection and/or diagnosis of SARS-CoV-2 by FDA under an Emergency Use Authorization (EUA).  This EUA will remain in effect (meaning this test can be used) for the duration of the COVID-19 declaration under Section 564(b)(1) of the Act, 21 U.S.C. section 360bbb-3(b)(1), unless the authorization is terminated or revoked sooner.  Performed at [facility], [address] [place], [place] [address]   SARS Coronavirus 2 by RT PCR (hospital order, performed in [facility] hospital lab) Nasopharyngeal Nasopharyngeal Swab     Status: None   Collection Time: 12/15/19 11:09 AM   Specimen: Nasopharyngeal Swab  Result Value Ref Range Status   SARS Coronavirus 2 NEGATIVE NEGATIVE Final    Comment: (NOTE) SARS-CoV-2 target nucleic acids are NOT DETECTED.  The  SARS-CoV-2 RNA is generally detectable in  upper and lower respiratory specimens during the acute phase of infection. The lowest concentration of SARS-CoV-2 viral copies this assay can detect is 250 copies / mL. A negative result does not preclude SARS-CoV-2 infection and should not be used as the sole basis for treatment or other patient management decisions.  A negative result may occur with improper specimen collection / handling, submission of specimen other than nasopharyngeal swab, presence of viral mutation(s) within the areas targeted by this assay, and inadequate number of viral copies (<250 copies / mL). A negative result must be combined with clinical observations, patient history, and epidemiological information.  Fact Sheet for Patients:   StrictlyIdeas.no  Fact Sheet for Healthcare Providers: BankingDealers.co.za  This test is not yet approved or  cleared by the [place] FDA and has been authorized for detection and/or diagnosis of SARS-CoV-2 by FDA under an Emergency Use Authorization (EUA).  This EUA will remain in effect (meaning this test can be used) for the duration of the COVID-19 declaration under Section 564(b)(1) of the Act, 21 U.S.C. section 360bbb-3(b)(1), unless the authorization is terminated or revoked sooner.  Performed at [facility], [address] [place], [place] [address]      Radiological Exams on Admission: CT Head Wo Contrast  Result Date: 12/15/2019 CLINICAL DATA:  Mental status changes EXAM: CT HEAD WITHOUT CONTRAST TECHNIQUE: Contiguous axial images were obtained from the base of the skull through the vertex without intravenous contrast. 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 There is atrophy and chronic small vessel disease changes. No acute intracranial abnormality. Specifically, no hemorrhage, hydrocephalus, mass lesion, acute infarction, or  significant intracranial injury. Vascular: No hyperdense vessel or unexpected calcification. Skull: No acute calvarial abnormality. Sinuses/Orbits: Visualized paranasal sinuses and mastoids clear. Orbital soft tissues unremarkable. 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 Atrophy, chronic microvascular disease. No acute intracranial abnormality. Electronically Signed   By: Rolm Baptise M.D.   On: 12/15/2019 11:59   DG Chest Port 1 View  Result Date: 12/15/2019 CLINICAL DATA:  Possible sepsis EXAM: PORTABLE CHEST 1 VIEW COMPARISON:  12/08/2019 FINDINGS: Low lung volumes with probable patchy atelectasis at the lung bases. Possible small pleural effusions. Similar cardiomediastinal contours. IMPRESSION: Possible small pleural effusions and probable patchy bibasilar atelectasis. Electronically Signed   By: Macy Mis M.D.   On: 12/15/2019 11:57     EKG: Independently reviewed.  Atrial fibrillation, QTc 506, anteroseptal infarction pattern, low voltage  Assessment/Plan Principal Problem:   Acute metabolic encephalopathy Active Problems:   Essential (primary) hypertension   History of alcoholism (HCC)   Hypernatremia   Dementia with behavioral disturbance (HCC)   Chronic systolic CHF (congestive heart failure) (HCC)   Sepsis (HCC)   CKD (chronic kidney disease), stage IV (HCC)   Type II diabetes mellitus with renal manifestations (HCC)   Depression   Atrial fibrillation with RVR (HCC)   Hypermagnesemia   Hypothermia   Dehydration   Comatose ([place])   Acute metabolic encephalopathy and comatose: Etiology is not clear.  CT head negative for acute intracranial abnormalities, but showed old left posterior parietal infarct.  Likely multifactorial etiology, including possible sepsis, dehydration, electrolyte disturbance.  -Admitted to progressive bed as inpatient -Frequent neuro check -IV fluid -Antibiotics -Hold all oral medications until mental status improves -Keep  patient n.p.o. until mental status improves  Possible sepsis versus SIRS: Patient has hypotension, hypothermia tachycardia, tachypnea.  Lactic acid 3.0, 3.6.  No source of infection identified.  Urinalysis negative.  Chest x-ray did not show infiltration. -Empiric antibiotics: Vancomycin and cefepime (patient received 1 dose of Flagyl in ED) -will get Procalcitonin and trend lactic acid levels per sepsis protocol. -IVF: 2L of LR bolus in ED, followed by 125 cc/h of D5-1/2 -f/u Bx and Ux  Essential (primary) hypertension -Hold blood pressure medications due to hypotension  History of alcoholism ([place]): Patient is in nursing home, likely not drinking alcohol -watch signs of withdrawal closely  Hypernatremia: Sodium 152 -IV fluid: 2 L LR, then D5-half-normal saline at 125 cc/h -His daughter wants [place] to limite lab draw, therefore will not check osmolality, TSH  Dementia with behavioral disturbance ([place]) -Hold home oral medications  Chronic systolic CHF (congestive heart failure) ([place]): 2D echo on 09/29/2019 showed EF of 25-30%.  BNP 1575, but the patient is clinically dry. -Monitoring volume status closely  CKD (chronic kidney disease), stage IV ([place]): -f/u by BMP  Type II diabetes mellitus with renal manifestations [facility]): Recent A1c 7.0.  Patient is taking NovoLog. -SSI  Depression -Hold home oral medications  Atrial fibrillation with RVR ([place]): -As needed IV metoprolol -Hold home medications and Pradaxa  Hypermagnesemia: Mg 2.9 -On IV fluid  Hypothermia -Bairr Hugger  Dehydration -IV fluid as above  Goal of care: Patient's presentation is extremely complicated. Patient is comatose status.  His prognosis is very poor.  I have had a lengthy discussion with her daughter about goals of care on the phone.  Daughter is very supportive and realistic.  Daughter would like patient to be treated with empiric antibiotics and IV fluid tonight.  If patient deteriorates or no improvement,  will change to comfort care.   DVT ppx: SQ Heparin   Code Status: DNR per his daughter Family Communication:   Yes, patient's daughter by phone Disposition Plan:  Anticipate discharge back to previous SNF environment Consults called:  none Admission status:   progressive unit as inpt       Status is: Inpatient  Remains inpatient appropriate because:Inpatient level of care appropriate due to severity of illness.  Patient has multiple comorbidities, now presents with multiple acute issues including possible sepsis, acute metabolic encephalopathy, dehydration, hyponatremia, hypomagnesemia, A. fib with RVR.  His presentation is highly complicated.  Patient will need to be treated in the hospital for at least 2 days.    Dispo: The patient is from: SNF              Anticipated d/c is to: SNF              Anticipated d/c date is: 2 days              Patient currently is not medically stable to d/c.            Date of Service 12/15/2019    Ivor Costa [facility]   If 7PM-7AM, please contact night-coverage [email or web address] 12/15/2019, 6:21 PM

## 2019-12-15 NOTE — ED Notes (Signed)
Family at bedside. 

## 2019-12-15 NOTE — ED Notes (Signed)
[place]air warming blanket removed at this time; temp at 98.54f by temp foley.

## 2019-12-15 NOTE — Sepsis Progress Note (Signed)
Phone conversation with Dr Blaine Hamper about the progress of Code Sepsis. Discussed potential diagnoses and orders going forward, including another repeat of the lactic acid. Noted orders entered.

## 2019-12-15 NOTE — Progress Notes (Signed)
PHARMACY -  BRIEF ANTIBIOTIC NOTE   Pharmacy has received consult(s) for Cefepime and Vancomycin from an ED provider.  The patient's profile has been reviewed for ht/wt/allergies/indication/available labs.    One time order(s) placed by MD for Cefepime 2gm and Vancomycin 1 gram  Further antibiotics/pharmacy consults should be ordered by admitting physician if indicated.                       Thank you, Justin Manning A 12/15/2019  11:15 AM

## 2019-12-15 NOTE — Progress Notes (Signed)
CODE SEPSIS - PHARMACY COMMUNICATION  **Broad Spectrum Antibiotics should be administered within 1 hour of Sepsis diagnosis**  Time Code Sepsis Called/Page Received: 1110  Antibiotics Ordered: vanc/cefepime/metronidazole  Time of 1st antibiotic administration: 1123  Additional action taken by pharmacy: NA  If necessary, Name of Provider/Nurse Contacted: NA    Tawnya Crook ,PharmD Clinical Pharmacist  12/15/2019  12:11 PM

## 2019-12-15 NOTE — ED Triage Notes (Signed)
From [facility], CODE sepsis. Hypothermic, tachycardic and hypotensive by EMS. Given 500cc NS en route with EMS. Pt contracted and responsive to painful stimuli.

## 2019-12-15 NOTE — ED Notes (Signed)
Pt to Ct

## 2019-12-15 NOTE — ED Notes (Signed)
Verbal order to place pt back on [place]. Pt now on 4L [place].

## 2019-12-15 NOTE — ED Provider Notes (Signed)
[facility] Emergency Department Provider Note  ____________________________________________   First MD Initiated Contact with Patient 12/15/19 1058     (approximate)  I have reviewed the triage vital signs and the nursing notes.   HISTORY  Chief Complaint Code Sepsis   HPI Justin Manning is a 84 y.o. male with medical history significant forDM, HTN, CKD 3a, dementia, HFrEF, last EF 25 to 30% in May 2021, A. fib on Pradaxa presents with a worsening mental status over the last several hours associated with hypothermia, low blood pressure, increased shortness of breath, and tachycardia.  Patient is unable to write any history and arrival secondary to altered mental status.  I was eventually able to reach his daughter who noted he had poor p.o. intake of last several days and was currently being followed by palliative service but did not have chance to make any specific recommendations in the last day or so.         Past Medical History:  Diagnosis Date  . Anemia   . CKD (chronic kidney disease), stage III    Dr. Juleen China  . Dementia ([place])   . Diabetes mellitus without complication ([place])   . GERD (gastroesophageal reflux disease)   . Hyperlipidemia   . Hypertension   . Mitral regurgitation    Mild    Patient Active Problem List   Diagnosis Date Noted  . Chronic congestive heart failure ([place])   . Acute on chronic renal insufficiency   . Goals of care, counseling/discussion   . Palliative care by specialist   . DNR (do not resuscitate)   . Acute kidney injury superimposed on CKD ([place]) 12/05/2019  . Acute on chronic systolic CHF (congestive heart failure) ([place]) 12/05/2019  . Hypernatremia 12/05/2019  . Generalized weakness 12/05/2019  . Dementia with behavioral disturbance ([place]) 12/05/2019  . Hypoxia 12/05/2019  . Chronic anticoagulation 12/05/2019  . Acute CHF (congestive heart failure) ([place]) 12/05/2019  . Hypokalemia   . Elevated troponin    . Fall 09/29/2019  . History of alcoholism ([place]) 01/05/2017  . Hiatal hernia 04/11/2015  . Emphysema lung ([place]) 04/11/2015  . Protein malnutrition ([place]) 03/18/2015  . Anemia in chronic illness 11/09/2014  . Abdominal aortic atherosclerosis ([place]) 11/09/2014  . A-fib ([place]) 11/09/2014  . Atrial hypertrophy 11/09/2014  . Carotid arterial disease ([place]) 11/09/2014  . Chronic kidney disease (CKD), stage III (moderate) 11/09/2014  . Diabetes mellitus with renal manifestations, uncontrolled ([place]) 11/09/2014  . Diastolic dysfunction with heart failure ([place]) 11/09/2014  . Dysfunction of eustachian tube 11/09/2014  . Type 2 diabetes mellitus with stage 3 chronic kidney disease ([place]) 11/09/2014  . Essential (primary) hypertension 11/09/2014  . Gastro-esophageal reflux disease without esophagitis 11/09/2014  . Hearing loss of left ear 11/09/2014  . H/O carotid endarterectomy 11/09/2014  . H/O malignant neoplasm of colon 11/09/2014  . H/O infectious disease 11/09/2014  . H/O malignant neoplasm of prostate 11/09/2014  . Lung nodule, solitary 11/09/2014  . Mild cognitive disorder 11/09/2014  . TI (tricuspid incompetence) 11/09/2014    Past Surgical History:  Procedure Laterality Date  . ENDARTERECTOMY Left    Carotid  . KNEE SURGERY Right   . PARTIAL COLECTOMY     with Anastomosis  . SHOULDER SURGERY Right     Prior to Admission medications   Medication Sig Start Date End Date Taking? Authorizing Provider  acetaminophen (TYLENOL) 325 MG tablet Take 2 tablets (650 mg total) by mouth every 4 (four) hours as needed for  headache or mild pain. 12/13/19   Ezekiel Slocumb, DO  bisacodyl (DULCOLAX) 5 MG EC tablet Take 1 tablet (5 mg total) by mouth daily as needed for moderate constipation. 10/06/19   Lorella Nimrod, MD  carvedilol (COREG) 12.5 MG tablet Take 1 tablet (12.5 mg total) by mouth 2 (two) times daily with a meal. 12/13/19   Ezekiel Slocumb, DO  Cholecalciferol 25 MCG (1000 UT) tablet  Take 2,000 Units by mouth 2 (two) times daily.     [provider]  Cyanocobalamin (VITAMIN B 12) 100 MCG LOZG Take 100 mcg by mouth daily.     [provider]  dabigatran (PRADAXA) 75 MG CAPS capsule Take 75 mg by mouth 2 (two) times daily.     [provider]  feeding supplement, ENSURE ENLIVE, (ENSURE ENLIVE) LIQD Take 237 mLs by mouth 2 (two) times daily between meals. 12/13/19   Ezekiel Slocumb, DO  food thickener (THICK IT) POWD Take 1 g by mouth as needed (dysphagia). 12/13/19   Nicole Kindred A, DO  glucose blood (FREESTYLE LITE) test strip TEST BLOOD SUGAR THREE TIMES DAILY AS DIRECTED.. 11/11/18   Steele Sizer, MD  insulin aspart (NOVOLOG) 100 UNIT/ML injection Inject 0-9 Units into the skin 3 (three) times daily with meals. Give 2 units for CBG 201-250, 3 units for CBG 251-300, 4 units for 301-350, 5 units for CBG 351-400, 7 units for CBG > 400 and call MD. 12/13/19   Nicole Kindred A, DO  ipratropium-albuterol (DUONEB) 0.5-2.5 (3) MG/3ML SOLN Take 3 mLs by nebulization every 6 (six) hours as needed. 12/13/19   Ezekiel Slocumb, DO  isosorbide mononitrate (IMDUR) 30 MG 24 hr tablet Take 1 tablet (30 mg total) by mouth daily. 10/06/19   Lorella Nimrod, MD  memantine (NAMENDA) 5 MG tablet TAKE 1 TABLET(5 MG) BY MOUTH TWICE DAILY Patient taking differently: Take 5 mg by mouth 2 (two) times daily.  11/20/19   Steele Sizer, MD  Multiple Vitamin (MULTIVITAMIN WITH MINERALS) TABS tablet Take 1 tablet by mouth daily. 10/06/19   Lorella Nimrod, MD  ondansetron (ZOFRAN) 4 MG tablet Take 1 tablet (4 mg total) by mouth every 6 (six) hours as needed for nausea. 10/06/19   Lorella Nimrod, MD  polyethylene glycol (MIRALAX / GLYCOLAX) 17 g packet Take 17 g by mouth daily as needed for mild constipation or moderate constipation. 10/06/19   Lorella Nimrod, MD  QUEtiapine (SEROQUEL) 25 MG tablet Take 1 tablet (25 mg total) by mouth at bedtime. 10/06/19   Lorella Nimrod, MD     Allergies Aspirin  Family History  Problem Relation Age of Onset  . Alcohol abuse Brother   . Diabetes Daughter     Social History Social History   Tobacco Use  . Smoking status: Former Smoker    Packs/day: 1.00    Years: 10.00    Pack years: 10.00    Types: Cigarettes    Quit date: 05/15/1988    Years since quitting: 31.6  . Smokeless tobacco: Never Used  . Tobacco comment: smoking cessation materials not required  Vaping Use  . Vaping Use: Never used  Substance Use Topics  . Alcohol use: No    Alcohol/week: 0.0 standard drinks    Comment: used to be a heavy drinker but quit 40 years ago   . Drug use: Not Currently    Types: Marijuana    Comment: many years ago     Review of Systems  Review of Systems  Unable to perform ROS: Mental status change      ____________________________________________   PHYSICAL EXAM:  VITAL SIGNS: ED Triage Vitals  Enc Vitals Group     BP      Pulse      Resp      Temp      Temp src      SpO2      Weight      Height      Head Circumference      Peak Flow      Pain Score      Pain Loc      Pain Edu?      Excl. in [place]?    Vitals:   12/15/19 1211 12/15/19 1229  BP:  (!) 155/95  Pulse:  (!) 132  Resp:  16  Temp:  (!) 95.1 F (35.1 C)  SpO2: 100% 100%   Physical Exam Vitals and nursing note reviewed.  Constitutional:      General: He is in acute distress.     Appearance: He is ill-appearing.  HENT:     Head: Normocephalic and atraumatic.     Right Ear: External ear normal.     Left Ear: External ear normal.     Nose: Nose normal.     Mouth/Throat:     Mouth: Mucous membranes are dry.  Eyes:     Conjunctiva/sclera: Conjunctivae normal.     Pupils: Pupils are equal, round, and reactive to light.  Cardiovascular:     Rate and Rhythm: Tachycardia present. Rhythm irregular.     Pulses: Normal pulses.  Pulmonary:     Effort: Respiratory distress present.  Abdominal:     General: There is no distension.      Tenderness: There is no right CVA tenderness or left CVA tenderness.  Genitourinary:    Rectum: Normal.  Musculoskeletal:     Cervical back: No rigidity.     Right lower leg: No edema.     Left lower leg: No edema.  Skin:    General: Skin is dry.     Capillary Refill: Capillary refill takes 2 to 3 seconds.  Neurological:     Mental Status: He is lethargic.     Motor: Weakness present.     No large sacral decubitus ulcer. ____________________________________________   LABS (all labs ordered are listed, but only abnormal results are displayed)  Labs Reviewed  LACTIC ACID, PLASMA - Abnormal; Notable for the following components:      Result Value   Lactic Acid, Venous 3.0 (*)    All other components within normal limits  LACTIC ACID, PLASMA - Abnormal; Notable for the following components:   Lactic Acid, Venous 3.6 (*)    All other components within normal limits  COMPREHENSIVE METABOLIC PANEL - Abnormal; Notable for the following components:   Sodium 152 (*)    Chloride 120 (*)    Glucose, Bld 146 (*)    BUN 64 (*)    Creatinine, Ser 2.59 (*)    Calcium 8.4 (*)    Total Protein 6.1 (*)    Albumin 2.9 (*)    GFR calc non Af Amer 22 (*)    GFR calc Af Amer 26 (*)    All other components within normal limits  CBC WITH DIFFERENTIAL/PLATELET - Abnormal; Notable for the following components:   RBC 4.21 (*)    Hemoglobin 12.5 (*)    RDW 16.6 (*)    nRBC 0.4 (*)  All other components within normal limits  PROTIME-INR - Abnormal; Notable for the following components:   Prothrombin Time 23.0 (*)    INR 2.1 (*)    All other components within normal limits  APTT - Abnormal; Notable for the following components:   aPTT 67 (*)    All other components within normal limits  URINALYSIS, COMPLETE (UACMP) WITH MICROSCOPIC - Abnormal; Notable for the following components:   Color, Urine YELLOW (*)    APPearance CLOUDY (*)    Protein, ur 100 (*)    All other components  within normal limits  MAGNESIUM - Abnormal; Notable for the following components:   Magnesium 2.9 (*)    All other components within normal limits  BRAIN NATRIURETIC PEPTIDE - Abnormal; Notable for the following components:   B Natriuretic Peptide 1,575.9 (*)    All other components within normal limits  SARS CORONAVIRUS 2 BY RT PCR (HOSPITAL ORDER, [facility])  CULTURE, BLOOD (ROUTINE X 2)  CULTURE, BLOOD (ROUTINE X 2)  URINE CULTURE  TSH  AMMONIA  BLOOD GAS, VENOUS   ____________________________________________  EKG  A. fib with a ventricular rate of 132, prolonged QTc interval at 506, and Q waves and T wave changes in the anterior, inferior, and lateral leads. ____________________________________________  RADIOLOGY   Official radiology report(s): CT Head Wo Contrast  Result Date: 12/15/2019 CLINICAL DATA:  Mental status changes EXAM: CT HEAD WITHOUT CONTRAST TECHNIQUE: Contiguous axial images were obtained from the base of the skull through the vertex without intravenous contrast. 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 There is atrophy and chronic small vessel disease changes. No acute intracranial abnormality. Specifically, no hemorrhage, hydrocephalus, mass lesion, acute infarction, or significant intracranial injury. Vascular: No hyperdense vessel or unexpected calcification. Skull: No acute calvarial abnormality. Sinuses/Orbits: Visualized paranasal sinuses and mastoids clear. Orbital soft tissues unremarkable. 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 Atrophy, chronic microvascular disease. No acute intracranial abnormality. Electronically Signed   By: Rolm Baptise M.D.   On: 12/15/2019 11:59   DG Chest Port 1 View  Result Date: 12/15/2019 CLINICAL DATA:  Possible sepsis EXAM: PORTABLE CHEST 1 VIEW COMPARISON:  12/08/2019 FINDINGS: Low lung volumes with probable patchy atelectasis at the lung  bases. Possible small pleural effusions. Similar cardiomediastinal contours. IMPRESSION: Possible small pleural effusions and probable patchy bibasilar atelectasis. Electronically Signed   By: Macy Mis M.D.   On: 12/15/2019 11:57    ____________________________________________   PROCEDURES  Procedure(s) performed (including Critical Care):  .Critical Care Performed by: Lucrezia Starch, MD Authorized by: Lucrezia Starch, MD   Critical care provider statement:    Critical care time (minutes):  75   Critical care time was exclusive of:  Separately billable procedures and treating other patients   Critical care was necessary to treat or prevent imminent or life-threatening deterioration of the following conditions:  CNS failure or compromise, dehydration, sepsis and respiratory failure   Critical care was time spent personally by me on the following activities:  Discussions with consultants, evaluation of patient's response to treatment, examination of patient, ordering and performing treatments and interventions, ordering and review of laboratory studies, ordering and review of radiographic studies, pulse oximetry, re-evaluation of patient's condition, obtaining history from patient or surrogate and review of old charts .1-3 Lead EKG Interpretation Performed by: Lucrezia Starch, MD Authorized by: Lucrezia Starch, MD     Interpretation: abnormal     ECG rate  assessment: tachycardic     Rhythm: sinus tachycardia     Ectopy: none       ____________________________________________   INITIAL IMPRESSION / ASSESSMENT AND PLAN / ED COURSE        From patient's history, exam, and initial ED work-up is most concerning for sepsis with unclear source.  On arrival patient is noted to be tachycardic with an irregular rate, soft blood pressures with systolics in the 79T, tachypneic to the mid 20s, and hypothermic with a rectal temp of 95.  Code sepsis was immediately initiated with IV  fluids, broad-spectrum antibiotics, and a bear hugger applied.  No evidence of trauma or infectious source on exam.  No evidence on chest x-ray of clear pneumonia there is a small effusion.  No evidence on CT head of acute intracranial bleeding or other clear etiology of explain the patient's sudden onset of worsening confusion.  Patient is noted to have multiple metabolic derangements including hypernatremia, lactic acidosis, hyperchloremia and elevated creatinine which is close to patient's baseline.  Patient's daughter did arrive to emergency room after the patient arrived we had extensive discussions regarding his goals of care.  She reiterated that patient would not want any chest compressions should his heart stop, intubation should his breathing decline, or placement of central lines or arterial lines.  After some discussion patient was made comfort care with a goal to focus on alleviating dyspnea.  We'll continue IV fluids to see if it helps with a sensation of thirst he may have and let current antibiotics home finished.  We'll plan to admit to hospital service for further evaluation management.          ____________________________________________   FINAL CLINICAL IMPRESSION(S) / ED DIAGNOSES  Final diagnoses:  Sepsis, due to unspecified organism, unspecified whether acute organ dysfunction present ([place])  Goals of care, counseling/discussion  Hypothermia, initial encounter  Lactic acid acidosis  Hypernatremia  Altered mental status, unspecified altered mental status type     ED Discharge Orders    None       Note:  This document was prepared using Dragon voice recognition software and may include unintentional dictation errors.   Lucrezia Starch, MD 12/15/19 1326

## 2019-12-16 LAB — BASIC METABOLIC PANEL
Anion gap: 12 (ref 5–15)
BUN: 69 mg/dL — ABNORMAL HIGH (ref 8–23)
CO2: 21 mmol/L — ABNORMAL LOW (ref 22–32)
Calcium: 8.8 mg/dL — ABNORMAL LOW (ref 8.9–10.3)
Chloride: 116 mmol/L — ABNORMAL HIGH (ref 98–111)
Creatinine, Ser: 2.97 mg/dL — ABNORMAL HIGH (ref 0.61–1.24)
GFR calc Af Amer: 22 mL/min — ABNORMAL LOW (ref >60)
GFR calc non Af Amer: 19 mL/min — ABNORMAL LOW (ref >60)
Glucose, Bld: 173 mg/dL — ABNORMAL HIGH (ref 70–99)
Potassium: 4.9 mmol/L (ref 3.5–5.1)
Sodium: 149 mmol/L — ABNORMAL HIGH (ref 135–145)

## 2019-12-16 LAB — URINE CULTURE: Culture: NO GROWTH

## 2019-12-16 LAB — LACTIC ACID, PLASMA
Lactic Acid, Venous: 2.7 mmol/L (ref 0.5–1.9)
Lactic Acid, Venous: 4.7 mmol/L (ref 0.5–1.9)

## 2019-12-16 LAB — GLUCOSE, CAPILLARY
Glucose-Capillary: 91 mg/dL (ref 70–99)
Glucose-Capillary: 140 mg/dL — ABNORMAL HIGH (ref 70–99)

## 2019-12-16 LAB — CBC
HCT: 41.8 % (ref 39.0–52.0)
Hemoglobin: 13.1 g/dL (ref 13.0–17.0)
MCH: 30 pg (ref 26.0–34.0)
MCHC: 31.3 g/dL (ref 30.0–36.0)
MCV: 95.9 fL (ref 80.0–100.0)
Platelets: 206 10*3/uL (ref 150–400)
RBC: 4.36 MIL/uL (ref 4.22–5.81)
RDW: 17.1 % — ABNORMAL HIGH (ref 11.5–15.5)
WBC: 9.3 10*3/uL (ref 4.0–10.5)
nRBC: 1 % — ABNORMAL HIGH (ref 0.0–0.2)

## 2019-12-16 LAB — MRSA PCR SCREENING: MRSA by PCR: NEGATIVE

## 2019-12-16 LAB — MAGNESIUM: Magnesium: 3.1 mg/dL — ABNORMAL HIGH (ref 1.7–2.4)

## 2019-12-16 LAB — PROCALCITONIN: Procalcitonin: 0.15 ng/mL

## 2019-12-16 MED ORDER — SCOPOLAMINE 1 MG/3DAYS TD PT72
1.0000 | MEDICATED_PATCH | TRANSDERMAL | Status: DC
  Administered 2019-12-16: 1.5 mg via TRANSDERMAL
  Filled 2019-12-16 (×2): qty 1

## 2019-12-16 MED ORDER — LORAZEPAM 2 MG/ML IJ SOLN
1.0000 mg | Freq: Four times a day (QID) | INTRAMUSCULAR | Status: DC | PRN
  Administered 2019-12-16 – 2019-12-17 (×3): 1 mg via INTRAVENOUS
  Filled 2019-12-16 (×3): qty 1

## 2019-12-16 MED ORDER — MORPHINE SULFATE (PF) 2 MG/ML IV SOLN
1.0000 mg | INTRAVENOUS | Status: DC | PRN
  Administered 2019-12-17 (×4): 1 mg via INTRAVENOUS
  Filled 2019-12-16 (×4): qty 1

## 2019-12-16 MED ORDER — MORPHINE SULFATE (PF) 2 MG/ML IV SOLN
1.0000 mg | INTRAVENOUS | Status: DC | PRN
  Administered 2019-12-16: 18:00:00 1 mg via INTRAVENOUS
  Filled 2019-12-16: qty 1

## 2019-12-16 MED ORDER — CHLORHEXIDINE GLUCONATE CLOTH 2 % EX PADS
6.0000 | MEDICATED_PAD | Freq: Every day | CUTANEOUS | Status: DC
  Administered 2019-12-16 – 2019-12-17 (×2): 6 via TOPICAL

## 2019-12-16 MED ORDER — MORPHINE SULFATE (PF) 2 MG/ML IV SOLN
2.0000 mg | INTRAVENOUS | Status: AC
  Administered 2019-12-16: 2 mg via INTRAVENOUS
  Filled 2019-12-16: qty 1

## 2019-12-16 NOTE — Progress Notes (Signed)
Pt heart rate 130's-140's with pursed lip breathing. Respiratory called to give pt a treatment. Pt given IVP Metoprolol 2.5mg  as ordered. Remained with pt until symptoms subsided. See chart for details. Delrae Rend, RN (Camera operator) and Hassan Rowan, NP notified of pt issues and interventions due to a Red MEWS score. Will continue to monitor pt closely.

## 2019-12-16 NOTE — Consult Note (Signed)
Pharmacy Antibiotic Note  Justin Manning is a 84 y.o. male with medical history including diabetes, hypertension, CKD stage 3a, dementia, HFrEF, atrial fibrillation on Pradaxa admitted on 12/15/2019 with sepsis with unclear source. Of note, patient was recently admitted 7/23 - 7/31 with AKI and hypernatremia. Does not appear patient received antibiotics during last admission. Pharmacy has been consulted for vancomycin and cefepime dosing.  Patient received vancomycin 1 g in ED. Followed by maintenance regimen of 750 mg q24h per nomogram.  Cefepime 2 g q24h (renally adjusted).  Scr today is 2.97 elevated from 2.59 yesterday.  Plan: Continue Cefepime 2 g q24h (renally adjusted) and will d/c tomorrow if patient is ok per MD.  Vancomycin discontinued 8/3   Height: 5\' 8"  (172.7 cm) Weight: 85.4 kg (188 lb 3.2 oz) IBW/kg (Calculated) : 68.4  Temp (24hrs), Avg:97.9 F (36.6 C), Min:96.4 F (35.8 C), Max:98.9 F (37.2 C)  Recent Labs  Lab 12/11/19 0504 12/12/19 0542 12/13/19 0633 12/15/19 1109 12/15/19 1250 12/16/19 0005 12/16/19 0426 12/16/19 0547  WBC 7.2  --   --  6.9  --   --  9.3  --   CREATININE 2.68* 2.55* 2.36* 2.59*  --   --   --  2.97*  LATICACIDVEN  --   --   --  3.0* 3.6* 2.7* 4.7*  --     Estimated Creatinine Clearance: 20.4 mL/min (A) (by C-G formula based on SCr of 2.97 mg/dL (H)).    Allergies  Allergen Reactions  . Aspirin     tachycardia    Antimicrobials this admission: Vancomycin, cefepime, and metronidazole x 1 in the ED on 8/2  Cefepime 8/2 >>  Vancomycin 8/2 >> 8/3   Dose adjustments this admission: n/a  Microbiology results: 8/2 BCx: pending 8/2 UCx: pending  8/2 MRSA PCR: pending  Thank you for allowing pharmacy to be a part of this patient's care.  Benn Moulder, PharmD Pharmacy Resident  12/16/2019 2:23 PM

## 2019-12-16 NOTE — Progress Notes (Signed)
Report received on pt from Herman, Therapist, sports. Awaiting pt arrival to unit.

## 2019-12-16 NOTE — Progress Notes (Signed)
[name] [place] NAME: Justin Manning    MR#:  [id number]  DATE OF BIRTH:  [birth year]  SUBJECTIVE:   Patient has baseline dementia. He is a little restless earlier in the bed. Does not give any history review of systems. He is noncommunicative.  Patient was discharged on [facility] July to [facility] with palliative care was brought in with hypothermia and possible sepsis.  NO Family in the room REVIEW OF SYSTEMS:   Review of Systems  Unable to perform ROS: Dementia   Tolerating Diet: Tolerating PT:    DRUG ALLERGIES:   Allergies  Allergen Reactions  . Aspirin     tachycardia    VITALS:  Blood pressure (!) 107/92, pulse (!) 116, temperature (!) 97.5 F (36.4 C), temperature source Oral, resp. rate 20, height 5\' 8"  (1.727 m), weight 85.4 kg, SpO2 98 %.  PHYSICAL EXAMINATION:   Physical Examlimited due to advance dementia  GENERAL:  84 y.o.-year-old patient lying in the bed with no acute distress. Confused dementia LUNGS: Normal breath sounds bilaterally, no wheezing, rales, rhonchi. No use of accessory muscles of respiration.  CARDIOVASCULAR: S1, S2 normal. No murmurs, rubs, or gallops.  ABDOMEN: Soft, nontender, nondistended. Bowel sounds present. No organomegaly or mass.  EXTREMITIES: No cyanosis, clubbing or edema b/l.    NEUROLOGIC: moves all extremities Very resltess PSYCHIATRIC:  patient is resltess--has dementia   LABORATORY PANEL:  CBC Recent Labs  Lab 12/16/19 0426  WBC 9.3  HGB 13.1  HCT 41.8  PLT 206    Chemistries  Recent Labs  Lab 12/15/19 1109 12/15/19 1109 12/16/19 0547  NA 152*   < > 149*  K 4.5   < > 4.9  CL 120*   < > 116*  CO2 23   < > 21*  GLUCOSE 146*   < > 173*  BUN 64*   < > 69*  CREATININE 2.59*   < > 2.97*  CALCIUM 8.4*   < > 8.8*  MG 2.9*   < > 3.1*  AST 26  --   --   ALT 21  --   --   ALKPHOS 60  --   --   BILITOT 0.9  --   --    < > = values in this interval not  displayed.   Cardiac Enzymes No results for input(s): TROPONINI in the last 168 hours. RADIOLOGY:  CT Head Wo Contrast  Result Date: 12/15/2019 CLINICAL DATA:  Mental status changes EXAM: CT HEAD WITHOUT CONTRAST TECHNIQUE: Contiguous axial images were obtained from the base of the skull through the vertex without intravenous contrast. 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 There is atrophy and chronic small vessel disease changes. No acute intracranial abnormality. Specifically, no hemorrhage, hydrocephalus, mass lesion, acute infarction, or significant intracranial injury. Vascular: No hyperdense vessel or unexpected calcification. Skull: No acute calvarial abnormality. Sinuses/Orbits: Visualized paranasal sinuses and mastoids clear. Orbital soft tissues unremarkable. 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 Atrophy, chronic microvascular disease. No acute intracranial abnormality. Electronically Signed   By: Justin Manning M.D.   On: 12/15/2019 11:59   DG Chest Port 1 View  Result Date: 12/15/2019 CLINICAL DATA:  Possible sepsis EXAM: PORTABLE CHEST 1 VIEW COMPARISON:  12/08/2019 FINDINGS: Low lung volumes with probable patchy atelectasis at the lung bases. Possible small pleural effusions. Similar cardiomediastinal contours. IMPRESSION: Possible small pleural effusions and probable patchy bibasilar atelectasis. Electronically Signed  By: Macy Mis M.D.   On: 12/15/2019 11:57   ASSESSMENT AND PLAN:   Justin Manning is a 85 y.o. male with medical history significant of hypertension, hyperlipidemia, diabetes mellitus, dementia, GERD, depression, CKD stage IV, anemia, mitral regurgitation, CHF with EF 25-30%, atrial fibrillation on Pradaxa, anemia, who presents with altered mental status, hypotension, hypothermia, tachycardia.   Acute metabolic encephalopathy and comatose: Etiology is not clear.  CT head negative for acute intracranial  abnormalities, but showed old left posterior parietal infarct.  Likely multifactorial etiology, including possible sepsis, dehydration, electrolyte disturbance.  Possible sepsis versus SIRS: Patient has hypotension, hypothermia tachycardia, tachypnea.  Lactic acid 3.0, 3.6.  No source of infection identified.  Urinalysis negative.  Chest x-ray did not show infiltration. -Empiric antibiotics: Vancomycin and cefepime (patient received 1 dose of Flagyl in ED)  Essential (primary) hypertension -Hold blood pressure medications due to hypotension  Hypernatremia: Sodium 152 with acute on chronic kidney disease stage IV -IV fluid: 2 L LR, then D5-half-normal saline at 125 cc/h -His daughter wants [place] to limite lab draw, therefore will not check osmolality, TSH -patient's creatinine is worsening. Cannot give fluids aggressively given his history of congestive heart failure.  Dementia with behavioral disturbance ([place]) -Hold home oral medications  Chronic systolic CHF (congestive heart failure) ([place]): 2D echo on 09/29/2019 showed EF of 25-30%.  BNP 1575  CKD (chronic kidney disease), stage IV ([place]): -f/u by BMP worsening creatinine  Type II diabetes mellitus with renal manifestations ([place]): Recent A1c 7.0. -SSI  Depression -Hold home oral medications  Atrial fibrillation with RVR ([place]): -As needed IV metoprolol -Hold home medications and Pradaxa  Goal of care: Patient's presentation is extremely complicated. Patient is comatose status.  His prognosis is very poor.  I have had a lengthy discussion with her daughter about goals of care on the phone.  Daughter is very supportive and realistic.  Discussed at length with patient's daughter [name] minutes on the phone. She understands patient has poor prognosis.  I did discuss with daughter he is not doing well and patient's daughter is agreeable with comfort care. I will stop IV fluids, not antibiotics, all meds and place comfort care  orders.  Hospice home option given. DVT ppx: comfort care Code Status: DNR per his daughter Family Communication:   Yes, patient's daughter by phone Disposition Plan:   to be determined consults called:  none Admission status:    inpatient    Status is: Inpatient  patient now has transition to comfort care. Dispo: The patient is from: SNF  Anticipated d/c is TBD  Anticipated d/c date is: ?  Patient currently is not medically stable to d/c.  TOTAL TIME TAKING CARE OF THIS PATIENT: 30 minutes.  >50% time spent on counselling and coordination of care  Note: This dictation was prepared with Dragon dictation along with smaller phrase technology. Any transcriptional errors that result from this process are unintentional.  Fritzi Mandes M.D    [facility]   CC: Primary care physician; Steele Sizer, MDPatient ID: Justin Manning, male   DOB: [birth year], 84 y.o.   MRN: [id number]

## 2019-12-16 NOTE — NC FL2 (Signed)
[place] LEVEL OF CARE SCREENING TOOL     IDENTIFICATION  Patient Name: Justin Manning Birthdate: 10-Feb-1936 Sex: male Admission Date (Current Location): 12/15/2019  [place] and [place] Number:  Engineering geologist and Address:  [facility], [address] [place], [place] [address]      Provider Number: [id number]  Attending Physician Name and Address:  Fritzi Mandes, MD  Relative Name and Phone Number:       Current Level of Care: Hospital Recommended Level of Care: [place] Prior Approval Number:    Date Approved/Denied:   PASRR Number:    Discharge Plan: SNF    Current Diagnoses: Patient Active Problem List   Diagnosis Date Noted  . Sepsis ([place]) 12/15/2019  . CKD (chronic kidney disease), stage IV ([place]) 12/15/2019  . Type II diabetes mellitus with renal manifestations ([place]) 12/15/2019  . Depression 12/15/2019  . Atrial fibrillation with RVR ([place]) 12/15/2019  . Hypermagnesemia 12/15/2019  . Hypothermia 12/15/2019  . Dehydration 12/15/2019  . Acute metabolic encephalopathy 09/98/3382  . Comatose ([place]) 12/15/2019  . Chronic congestive heart failure ([place])   . Acute on chronic renal insufficiency   . Goals of care, counseling/discussion   . Palliative care by specialist   . DNR (do not resuscitate)   . Acute kidney injury superimposed on CKD ([place]) 12/05/2019  . Acute on chronic systolic CHF (congestive heart failure) ([place]) 12/05/2019  . Hypernatremia 12/05/2019  . Generalized weakness 12/05/2019  . Dementia with behavioral disturbance ([place]) 12/05/2019  . Hypoxia 12/05/2019  . Chronic anticoagulation 12/05/2019  . Chronic systolic CHF (congestive heart failure) ([place]) 12/05/2019  . Hypokalemia   . Elevated troponin   . Fall 09/29/2019  . History of alcoholism ([place]) 01/05/2017  . Hiatal hernia 04/11/2015  . Emphysema lung ([place]) 04/11/2015  . Protein malnutrition ([place]) 03/18/2015  . Anemia in chronic  illness 11/09/2014  . Abdominal aortic atherosclerosis ([place]) 11/09/2014  . A-fib ([place]) 11/09/2014  . Atrial hypertrophy 11/09/2014  . Carotid arterial disease ([place]) 11/09/2014  . Diabetes mellitus with renal manifestations, uncontrolled ([place]) 11/09/2014  . Diastolic dysfunction with heart failure ([place]) 11/09/2014  . Dysfunction of eustachian tube 11/09/2014  . Type 2 diabetes mellitus with stage 3 chronic kidney disease ([place]) 11/09/2014  . Essential (primary) hypertension 11/09/2014  . Gastro-esophageal reflux disease without esophagitis 11/09/2014  . Hearing loss of left ear 11/09/2014  . H/O carotid endarterectomy 11/09/2014  . H/O malignant neoplasm of colon 11/09/2014  . H/O infectious disease 11/09/2014  . H/O malignant neoplasm of prostate 11/09/2014  . Lung nodule, solitary 11/09/2014  . Mild cognitive disorder 11/09/2014  . TI (tricuspid incompetence) 11/09/2014    Orientation RESPIRATION BLADDER Height & Weight      (disoriented x4)  O2 ([place] 5L) Indwelling catheter, Incontinent (placed 8/2) Weight: 188 lb 3.2 oz (85.4 kg) Height:  5\' 8"  (172.7 cm)  BEHAVIORAL SYMPTOMS/MOOD NEUROLOGICAL BOWEL NUTRITION STATUS      Incontinent Diet (DYS 1 diet, thin liquids)  AMBULATORY STATUS COMMUNICATION OF NEEDS Skin   Limited Assist Verbally Normal                       Personal Care Assistance Level of Assistance  Bathing, Feeding, Dressing Bathing Assistance: Limited assistance Feeding assistance: Independent Dressing Assistance: Limited assistance     Functional Limitations Info  Sight, Hearing, Speech Sight Info: Adequate Hearing Info: Adequate Speech Info: Adequate    SPECIAL CARE FACTORS FREQUENCY  PT (By licensed PT), OT (By licensed OT)     PT Frequency: 5x OT Frequency: 5x            Contractures Contractures Info: Not present    Additional Factors Info  Code Status, Allergies Code Status Info: DNR Allergies Info: Aspirin           Current  Medications (12/16/2019):  This is the current hospital active medication list Current Facility-Administered Medications  Medication Dose Route Frequency Provider Last Rate Last Admin  . acetaminophen (TYLENOL) tablet 650 mg  650 mg Oral Q6H PRN Ivor Costa, MD       Or  . acetaminophen (TYLENOL) suppository 650 mg  650 mg Rectal Q6H PRN Ivor Costa, MD      . albuterol (PROVENTIL) (2.5 MG/3ML) 0.083% nebulizer solution 2.5 mg  2.5 mg Nebulization Q4H PRN Ivor Costa, MD   2.5 mg at 12/16/19 0325  . ceFEPIme (MAXIPIME) 2 g in sodium chloride 0.9 % 100 mL IVPB  2 g Intravenous Q24H Lockie Mola B, RPH 200 mL/hr at 12/16/19 1004 2 g at 12/16/19 1004  . Chlorhexidine Gluconate Cloth 2 % PADS 6 each  6 each Topical Daily Ivor Costa, MD   6 each at 12/16/19 1003  . dextrose 5 %-0.45 % sodium chloride infusion   Intravenous Continuous Fritzi Mandes, MD 100 mL/hr at 12/16/19 1202 Rate Change at 12/16/19 1202  . heparin injection 5,000 Units  5,000 Units Subcutaneous Q8H Ivor Costa, MD   5,000 Units at 12/16/19 [phone]  . insulin aspart (novoLOG) injection 0-5 Units  0-5 Units Subcutaneous QHS Ivor Costa, MD      . insulin aspart (novoLOG) injection 0-9 Units  0-9 Units Subcutaneous TID WC Ivor Costa, MD   1 Units at 12/15/19 1802  . metoprolol tartrate (LOPRESSOR) injection 2.5 mg  2.5 mg Intravenous Q3H PRN Ivor Costa, MD   2.5 mg at 12/16/19 1436     Discharge Medications: Please see discharge summary for a list of discharge medications.  Relevant Imaging Results:  Relevant Lab Results:   Additional Information INO:[id number]  Gerrianne Scale Dornell Grasmick, LCSW

## 2019-12-17 ENCOUNTER — Telehealth: Payer: Self-pay | Admitting: Family

## 2019-12-17 DIAGNOSIS — Z515 Encounter for palliative care: Secondary | ICD-10-CM

## 2019-12-17 DIAGNOSIS — Z7189 Other specified counseling: Secondary | ICD-10-CM

## 2019-12-17 MED ORDER — GLYCOPYRROLATE 1 MG PO TABS
1.0000 mg | ORAL_TABLET | Freq: Two times a day (BID) | ORAL | Status: DC
  Filled 2019-12-17 (×2): qty 1

## 2019-12-17 MED ORDER — GLYCOPYRROLATE 0.2 MG/ML IJ SOLN
0.2000 mg | INTRAMUSCULAR | Status: DC | PRN
  Administered 2019-12-17 (×2): 0.2 mg via INTRAVENOUS
  Filled 2019-12-17 (×2): qty 1

## 2019-12-17 MED ORDER — SCOPOLAMINE 1 MG/3DAYS TD PT72: 1.0000 | MEDICATED_PATCH | TRANSDERMAL | 12 refills | Status: AC

## 2019-12-17 NOTE — Telephone Encounter (Signed)
[facility] to verify that they have patient down for a new patient [facility] appointment with [place] for 8/12 at 9am and to see how patient was doing before realized he is back in hospital. Asked them to call us with any questions once patient is discharged.    Calena Salem, NT

## 2019-12-17 NOTE — Progress Notes (Signed)
[place] Room [facility]) Hospital Liaison RN note:  Received referral for request for Hospice Home from Dr. Manuella Ghazi and [facility] Doran Clay. Chart reviewed and eligibility approved. Spoke with son, Javid in room and daughter, Clarene Critchley over the phone to initiate education related to hospice philosophy and explain services provided. Clarene Critchley voiced understanding and all questions were answered.  Hospice Home is able to offer a bed today. Registration paperwork completed today by Clarene Critchley via Docu sign. Health care team has been updated.  I have called for EMS pick up at 5 pm and I have called report to the Hospice Home. I will fax the discharge summary as well.  Please call with any hospice related questions or concerns.  Thank you for the opportunity to participate in this patient's care.  Zandra Abts, RN [facility] Liaison [phone]

## 2019-12-17 NOTE — TOC Transition Note (Signed)
Transition of Care [facility]) - CM/SW Discharge Note   Patient Details  Name: Justin Manning MRN: [id number] Date of Birth: November 21, 1935  [facility]) CM/SW Contact:  Shelbie Hutching, RN Phone Number: 12/17/2019, 2:00 PM   Clinical Narrative:    Patient will discharge to residential hospice this afternoon.  Patient is going to [facility] in [place].  Patient's family is aware of discharge.    Final next level of care: [place] Barriers to Discharge: Barriers Resolved   Patient Goals and CMS Choice Patient states their goals for this hospitalization and ongoing recovery are:: Patient's daughter [name] would like for the patient to go to residential hospice CMS Medicare.gov Compare Post Acute Care list provided to:: Patient Represenative (must comment) Choice offered to / list presented to : Adult Children, [place] / [place]  Discharge Placement              Patient chooses bed at:  [facility]) Patient to be transferred to facility by: [facility] Name of family member notified: Marland Kitchen and [name] Patient and family notified of of transfer: 12/17/19  Discharge Plan and Services   Discharge Planning Services: CM Consult Post Acute Care Choice: Hospice                    HH Arranged: NA          Social Determinants of Health (SDOH) Interventions     Readmission Risk Interventions Readmission Risk Prevention Plan 12/17/2019  Transportation Screening Complete  Medication Review Press photographer) Complete  PCP or Specialist appointment within 3-5 days of discharge Complete  HRI or Home Care Consult Complete  SW Recovery Care/Counseling Consult Complete  Palliative Care Screening Complete  Skilled Nursing Facility Complete  Some recent data might be hidden

## 2019-12-17 NOTE — Discharge Summary (Signed)
[place] at [place] NAME: Justin Manning    MR#:  [id number]  DATE OF BIRTH:  05/27/1935  DATE OF ADMISSION:  12/15/2019   ADMITTING PHYSICIAN: Ivor Costa, MD  DATE OF DISCHARGE: 12/17/2019  PRIMARY CARE PHYSICIAN: Steele Sizer, MD   ADMISSION DIAGNOSIS:  Hypernatremia [E87.0] Lactic acid acidosis [E87.2] Hypothermia, initial encounter [T68.XXXA] Goals of care, counseling/discussion [Z71.89] Severe sepsis ([place]) [A41.9, R65.20] Altered mental status, unspecified altered mental status type [O37.85] Acute metabolic encephalopathy [Y85.02] Sepsis, due to unspecified organism, unspecified whether acute organ dysfunction present ([place]) [A41.9] DISCHARGE DIAGNOSIS:  Principal Problem:   Acute metabolic encephalopathy Active Problems:   Essential (primary) hypertension   History of alcoholism ([place])   Hypernatremia   Dementia with behavioral disturbance (HCC)   Chronic systolic CHF (congestive heart failure) ([place])   Hospice care   Sepsis ([place])   CKD (chronic kidney disease), stage IV (HCC)   Type II diabetes mellitus with renal manifestations ([place])   Depression   Atrial fibrillation with RVR ([place])   Hypermagnesemia   Hypothermia   Dehydration   Comatose ([place])  SECONDARY DIAGNOSIS:   Past Medical History:  Diagnosis Date  . Anemia   . CKD (chronic kidney disease), stage III    Dr. Juleen China  . Dementia ([place])   . Diabetes mellitus without complication ([place])   . GERD (gastroesophageal reflux disease)   . Hyperlipidemia   . Hypertension   . Mitral regurgitation    Mild   HOSPITAL COURSE:  84 year old male with a known history of hypertension, hyperlipidemia, diabetes mellitus, dementia, GERD, depression, CKD stage IV, anemia of chronic kidney disease, mitral regurgitation, CHF with a EF of 25 to 30%, atrial fibrillation on Pradaxa was admitted for acute metabolic encephalopathy.  Please see dictated history and physical for further  details.  Acute metabolic encephalopathy Sepsis ruled out Hypertension Hyper natremia Severe dementia with behavioral disturbance Chronic systolic CHF CKD stage IV Type 2 diabetes with renal manifestation Depression Permanent A. Fib  Patient's family has chosen hospice home where he is being discharged today per their wishes.  DISCHARGE CONDITIONS:  [place] CONSULTS OBTAINED:   DRUG ALLERGIES:   Allergies  Allergen Reactions  . Aspirin     tachycardia   DISCHARGE MEDICATIONS:   Allergies as of 12/17/2019      Reactions   Aspirin    tachycardia      Medication List    STOP taking these medications   acetaminophen 325 MG tablet Commonly known as: TYLENOL   bisacodyl 5 MG EC tablet Commonly known as: DULCOLAX   carvedilol 12.5 MG tablet Commonly known as: COREG   Cholecalciferol 25 MCG (1000 UT) tablet   feeding supplement (ENSURE ENLIVE) Liqd   food thickener Powd Commonly known as: THICK IT   insulin aspart 100 UNIT/ML injection Commonly known as: novoLOG   ipratropium-albuterol 0.5-2.5 (3) MG/3ML Soln Commonly known as: DUONEB   isosorbide mononitrate 30 MG 24 hr tablet Commonly known as: IMDUR   memantine 5 MG tablet Commonly known as: NAMENDA   multivitamin with minerals Tabs tablet   ondansetron 4 MG tablet Commonly known as: ZOFRAN   polyethylene glycol 17 g packet Commonly known as: MIRALAX / GLYCOLAX   Pradaxa 75 MG Caps capsule Generic drug: dabigatran   QUEtiapine 25 MG tablet Commonly known as: SEROQUEL   Vitamin B 12 100 MCG Lozg     TAKE these medications   scopolamine 1  MG/3DAYS Commonly known as: TRANSDERM-SCOP Place 1 patch (1.5 mg total) onto the skin every 3 (three) days. Start taking on: December 19, 2019      DISCHARGE INSTRUCTIONS:   DIET:  Pleasure feeding DISCHARGE CONDITION:  Serious ACTIVITY:  Activity as tolerated OXYGEN:  Home Oxygen: No.  Oxygen Delivery: room air DISCHARGE LOCATION:  Hospice  home  If you experience worsening of your admission symptoms, develop shortness of breath, life threatening emergency, suicidal or homicidal thoughts you must seek medical attention immediately by calling 911 or calling your MD immediately  if symptoms less severe.  You Must read complete instructions/literature along with all the possible adverse reactions/side effects for all the Medicines you take and that have been prescribed to you. Take any new Medicines after you have completely understood and accpet all the possible adverse reactions/side effects.   Please note  You were cared for by a hospitalist during your hospital stay. If you have any questions about your discharge medications or the care you received while you were in the hospital after you are discharged, you can call the unit and asked to speak with the hospitalist on call if the hospitalist that took care of you is not available. Once you are discharged, your primary care physician will handle any further medical issues. Please note that NO REFILLS for any discharge medications will be authorized once you are discharged, as it is imperative that you return to your primary care physician (or establish a relationship with a primary care physician if you do not have one) for your aftercare needs so that they can reassess your need for medications and monitor your lab values.    On the day of Discharge:  VITAL SIGNS:  Blood pressure 124/79, pulse (!) 104, temperature (!) 97.5 F (36.4 C), temperature source Oral, resp. rate (!) 32, height 5\' 8"  (1.727 m), weight 85.4 kg, SpO2 (!) 88 %. PHYSICAL EXAMINATION:  GENERAL:  84 y.o.-year-old patient lying in the bed with no acute distress.  EYES: Pupils equal, round, reactive to light and accommodation. No scleral icterus. Extraocular muscles intact.  HEENT: Head atraumatic, normocephalic. Oropharynx and nasopharynx clear.  NECK:  Supple, no jugular venous distention. No thyroid  enlargement, no tenderness.  LUNGS: Normal breath sounds bilaterally, no wheezing, rales,rhonchi or crepitation. No use of accessory muscles of respiration.  CARDIOVASCULAR: S1, S2 normal. No murmurs, rubs, or gallops.  ABDOMEN: Soft, non-tender, non-distended. Bowel sounds present. No organomegaly or mass.  EXTREMITIES: No pedal edema, cyanosis, or clubbing.  NEUROLOGIC: Comatose PSYCHIATRIC: The patient is comatose SKIN: No obvious rash, lesion, or ulcer.  DATA REVIEW:   CBC Recent Labs  Lab 12/16/19 0426  WBC 9.3  HGB 13.1  HCT 41.8  PLT 206    Chemistries  Recent Labs  Lab 12/15/19 1109 12/15/19 1109 12/16/19 0547  NA 152*   < > 149*  K 4.5   < > 4.9  CL 120*   < > 116*  CO2 23   < > 21*  GLUCOSE 146*   < > 173*  BUN 64*   < > 69*  CREATININE 2.59*   < > 2.97*  CALCIUM 8.4*   < > 8.8*  MG 2.9*   < > 3.1*  AST 26  --   --   ALT 21  --   --   ALKPHOS 60  --   --   BILITOT 0.9  --   --    < > = values in  this interval not displayed.     Management plans discussed with the patient, nursing and they are in agreement.  CODE STATUS: DNR   TOTAL TIME TAKING CARE OF THIS PATIENT: 45 minutes.    Max Sane M.D on 12/17/2019 at 2:40 PM  [facility]   CC: Primary care physician; Steele Sizer, MD   Note: This dictation was prepared with Dragon dictation along with smaller phrase technology. Any transcriptional errors that result from this process are unintentional.

## 2019-12-17 NOTE — TOC Initial Note (Signed)
Transition of Care [facility]) - Initial/Assessment Note    Patient Details  Name: Justin Manning MRN: [id number] Date of Birth: 03-08-1936  Transition of Care [facility]) CM/SW Contact:    Shelbie Hutching, RN Phone Number: 12/17/2019, 10:29 AM  Clinical Narrative:                 Patient admitted to the hospital with sepsis, discharged for the same on 12/13/19 back to [facility], where he is a long term resident.  Daughter, [name] has decided on comfort care for the patient and would like him to go to residential hospice facility.  Daughter does not want the patient to return to [facility] with hospice.  Daughter chooses [facility] home in [place].  Referral for residential hospice given to [facility] with [facility].  RNCM will wait to hear about bed availability for today.   Expected Discharge Plan: Hospice Medical Facility Barriers to Discharge: Other (comment) (Referral given to [facility] for residential hospice)   Patient Goals and CMS Choice Patient states their goals for this hospitalization and ongoing recovery are:: Patient's daughter [name] would like for the patient to go to residential hospice CMS Medicare.gov Compare Post Acute Care list provided to:: Patient Represenative (must comment) Choice offered to / list presented to : Adult Children (daughter [name])  Expected Discharge Plan and Services Expected Discharge Plan: [place]   Discharge Planning Services: CM Consult Post Acute Care Choice: Hospice Living arrangements for the past 2 months: [place]: NA          Prior Living Arrangements/Services Living arrangements for the past 2 months: [place] Lives with:: Facility Resident Patient language and need for interpreter reviewed:: Yes Do you feel safe going back to the place where you live?: No   daughter does not want the patient to go back to [facility] [facility]- would like residential hospice  Need for Family Participation in Patient Care: Yes (Comment) (comfort care) Care giver support system in place?: Yes (comment) (children)   Criminal Activity/Legal Involvement Pertinent to Current Situation/Hospitalization: No - Comment as needed  Activities of Daily Living      Permission Sought/Granted Permission sought to share information with : Case Manager, Customer service manager, Family Supports Permission granted to share information with : Yes, Verbal Permission Granted  Share Information with NAME: [name] and Crystal granted to share info w AGENCY: [place] granted to share info w Relationship: daughter and son     Emotional Assessment Appearance:: Appears stated age Attitude/Demeanor/Rapport: Unresponsive Affect (typically observed): Unable to Assess   Alcohol / Substance Use: Not Applicable Psych Involvement: No (comment)  Admission diagnosis:  Hypernatremia [E87.0] Lactic acid acidosis [E87.2] Hypothermia, initial encounter [T68.XXXA] Goals of care, counseling/discussion [Z71.89] Severe sepsis ([place]) [A41.9, R65.20] Altered mental status, unspecified altered mental status type [G26.94] Acute metabolic encephalopathy [W54.62] Sepsis, due to unspecified organism, unspecified whether acute organ dysfunction present [facility]) [A41.9] Patient Active Problem List   Diagnosis Date Noted  . Sepsis ([place]) 12/15/2019  . CKD (chronic kidney disease), stage IV ([place]) 12/15/2019  . Type II diabetes mellitus with renal manifestations ([place]) 12/15/2019  . Depression 12/15/2019  . Atrial fibrillation with RVR ([place]) 12/15/2019  . Hypermagnesemia 12/15/2019  . Hypothermia 12/15/2019  . Dehydration 12/15/2019  .  Acute metabolic encephalopathy 53/97/6734  . Comatose ([place]) 12/15/2019  . Chronic congestive heart failure ([place])   . Acute on chronic renal insufficiency   . Goals of care,  counseling/discussion   . Palliative care by specialist   . DNR (do not resuscitate)   . Acute kidney injury superimposed on CKD ([place]) 12/05/2019  . Acute on chronic systolic CHF (congestive heart failure) ([place]) 12/05/2019  . Hypernatremia 12/05/2019  . Generalized weakness 12/05/2019  . Dementia with behavioral disturbance ([place]) 12/05/2019  . Hypoxia 12/05/2019  . Chronic anticoagulation 12/05/2019  . Chronic systolic CHF (congestive heart failure) ([place]) 12/05/2019  . Hypokalemia   . Elevated troponin   . Fall 09/29/2019  . History of alcoholism ([place]) 01/05/2017  . Hiatal hernia 04/11/2015  . Emphysema lung ([place]) 04/11/2015  . Protein malnutrition ([place]) 03/18/2015  . Anemia in chronic illness 11/09/2014  . Abdominal aortic atherosclerosis ([place]) 11/09/2014  . A-fib ([place]) 11/09/2014  . Atrial hypertrophy 11/09/2014  . Carotid arterial disease ([place]) 11/09/2014  . Diabetes mellitus with renal manifestations, uncontrolled ([place]) 11/09/2014  . Diastolic dysfunction with heart failure ([place]) 11/09/2014  . Dysfunction of eustachian tube 11/09/2014  . Type 2 diabetes mellitus with stage 3 chronic kidney disease ([place]) 11/09/2014  . Essential (primary) hypertension 11/09/2014  . Gastro-esophageal reflux disease without esophagitis 11/09/2014  . Hearing loss of left ear 11/09/2014  . H/O carotid endarterectomy 11/09/2014  . H/O malignant neoplasm of colon 11/09/2014  . H/O infectious disease 11/09/2014  . H/O malignant neoplasm of prostate 11/09/2014  . Lung nodule, solitary 11/09/2014  . Mild cognitive disorder 11/09/2014  . TI (tricuspid incompetence) 11/09/2014   PCP:  Steele Sizer, MD Pharmacy:   [facility], [address] [place] [address] Phone: [phone] Fax: [phone]     Social Determinants of Health (SDOH) Interventions    Readmission Risk Interventions Readmission Risk Prevention Plan 12/17/2019   Transportation Screening Complete  Medication Review Press photographer) Complete  PCP or Specialist appointment within 3-5 days of discharge Complete  HRI or Home Care Consult Complete  SW Recovery Care/Counseling Consult Complete  Palliative Care Screening Complete  Skilled Nursing Facility Complete  Some recent data might be hidden

## 2019-12-18 LAB — BLOOD GAS, VENOUS
Acid-base deficit: 0.5 mmol/L (ref 0.0–2.0)
Bicarbonate: 26.3 mmol/L (ref 20.0–28.0)
O2 Saturation: 51.1 %
Patient temperature: 37
pCO2, Ven: 51 mmHg (ref 44.0–60.0)
pH, Ven: 7.32 (ref 7.250–7.430)

## 2019-12-20 LAB — CULTURE, BLOOD (ROUTINE X 2)
Culture: NO GROWTH
Culture: NO GROWTH
Special Requests: ADEQUATE
Special Requests: ADEQUATE

## 2019-12-25 ENCOUNTER — Ambulatory Visit: Payer: Medicare Other | Admitting: Family

## 2020-01-14 DEATH — deceased

## 2020-02-06 ENCOUNTER — Ambulatory Visit: Payer: Medicare Other | Admitting: Family Medicine

## 2020-02-24 ENCOUNTER — Ambulatory Visit: Payer: Medicare Other

## 2020-10-30 IMAGING — CT CT HEAD W/O CM
4 of 5 series · 15 of 47 positions shown, 17 images · non-contrast
Comparison: 12/05/2019

CLINICAL DATA: Mental status changes

EXAM:
CT HEAD WITHOUT CONTRAST
TECHNIQUE: Contiguous axial images were obtained from the base of the skull
through the vertex without intravenous contrast.

[Series 2: head wo · axial · 0.44mm/px · z∈[-118,-28]mm · 4 of 30 slices shown (1 of 2)]
[im 6/30  brain]
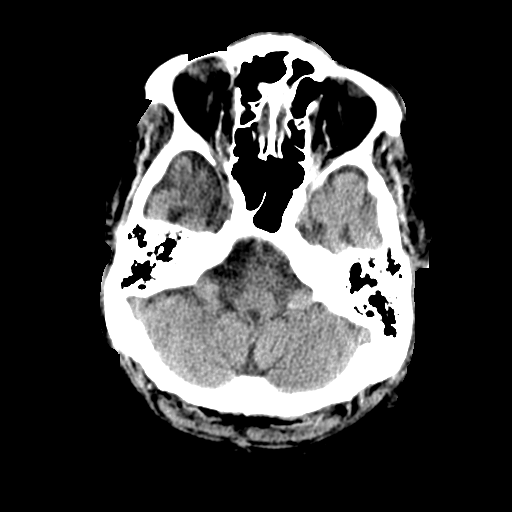
[im 12/30  brain]
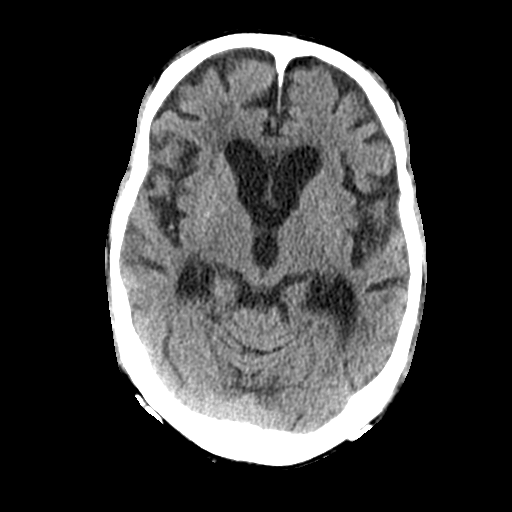
[im 18/30  brain]
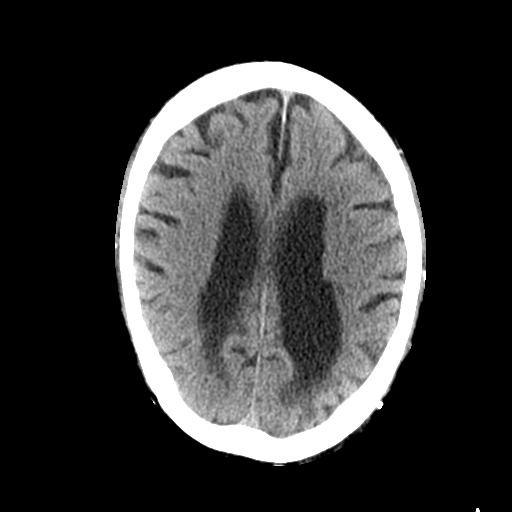
[im 24/30  brain]
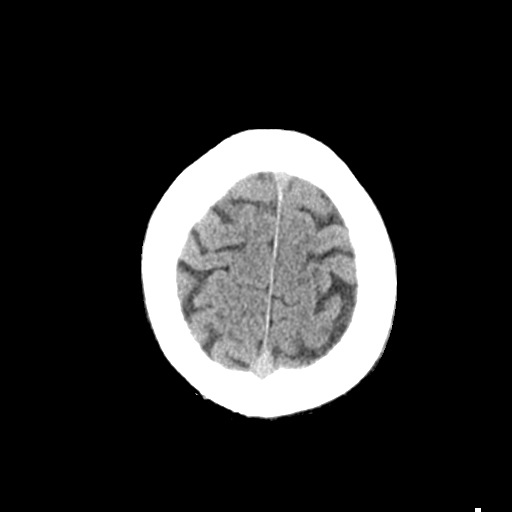

[Series 3: head wo · axial · 0.34mm/px · z∈[-122,-23]mm · 5 of 32 slices shown, 7 images (2 of 2)]
[im 6/32  brain]
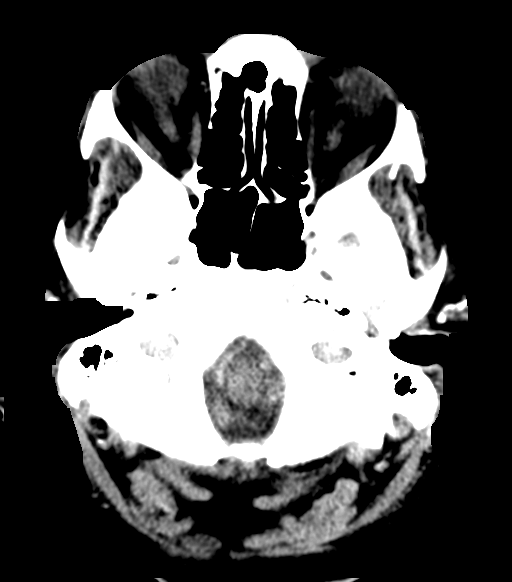
[im 6/32  bone]
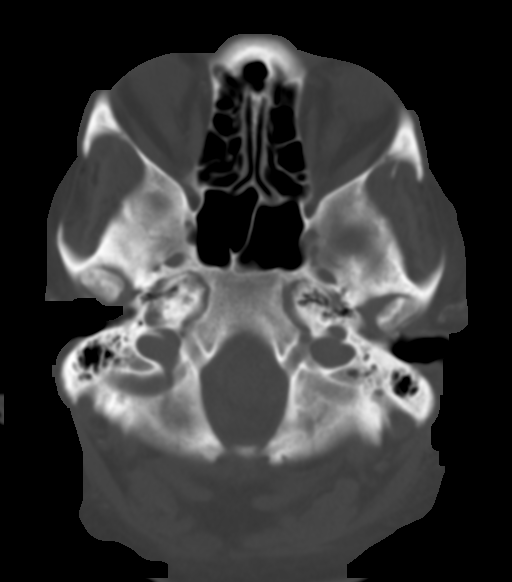
[im 11/32  brain]
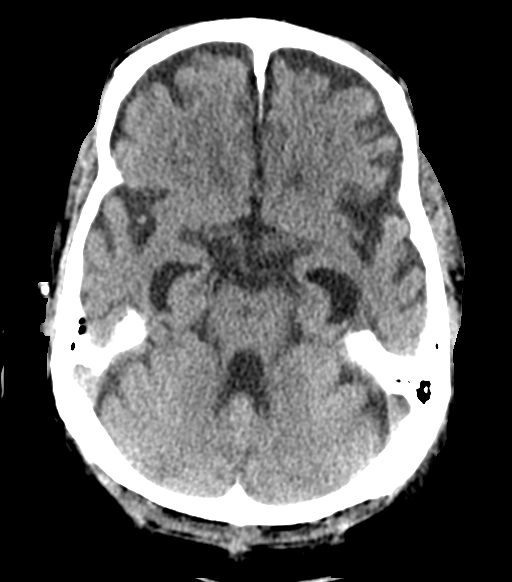
[im 16/32  brain]
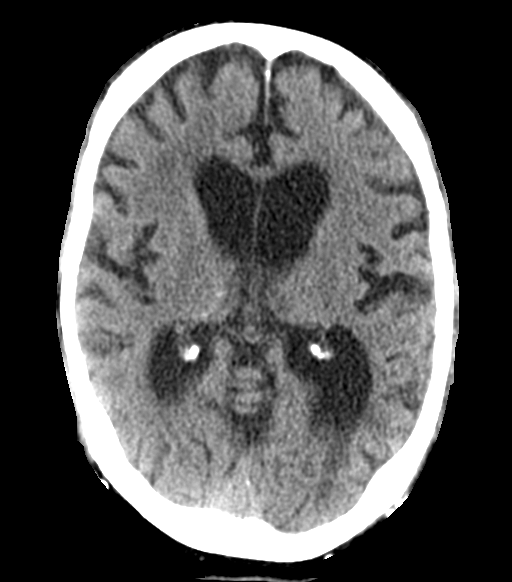
[im 21/32  brain]
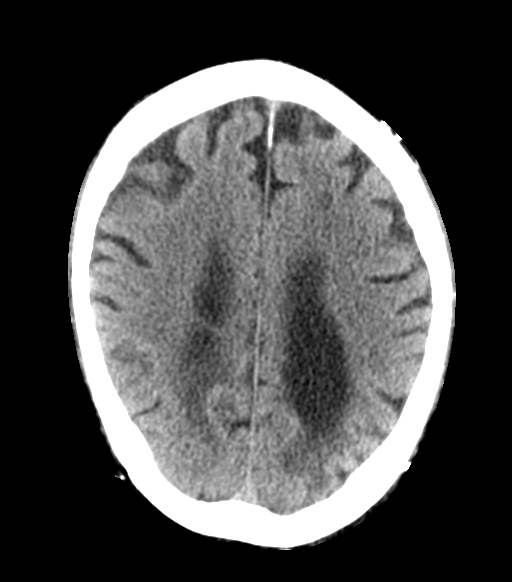
[im 26/32  brain]
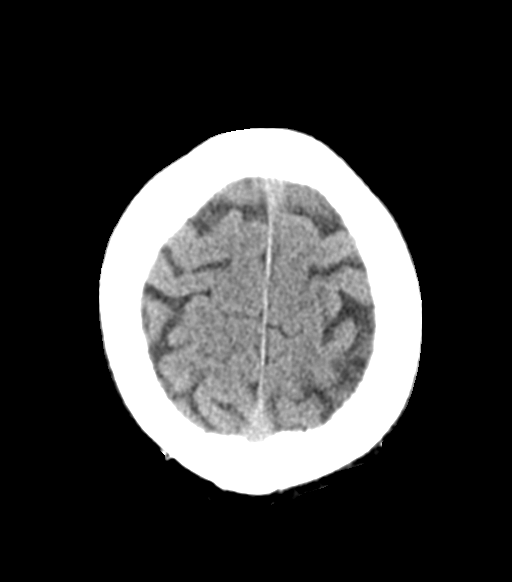
[im 26/32  bone]
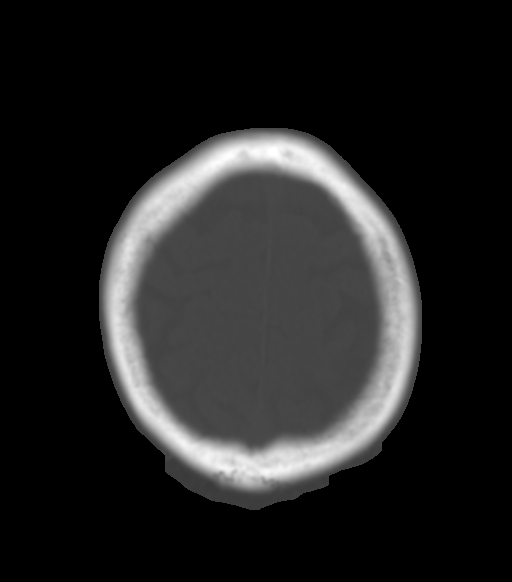

[Series 5: coronal soft tissue · coronal · 0.31mm/px · 3 of 67 slices shown]
[im 23/67  brain]
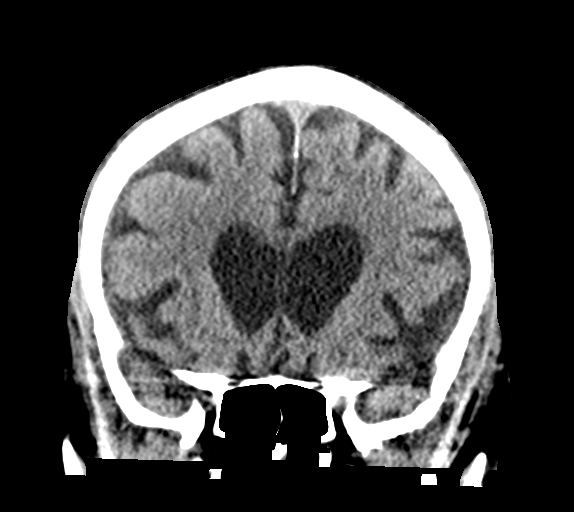
[im 30/67  brain]
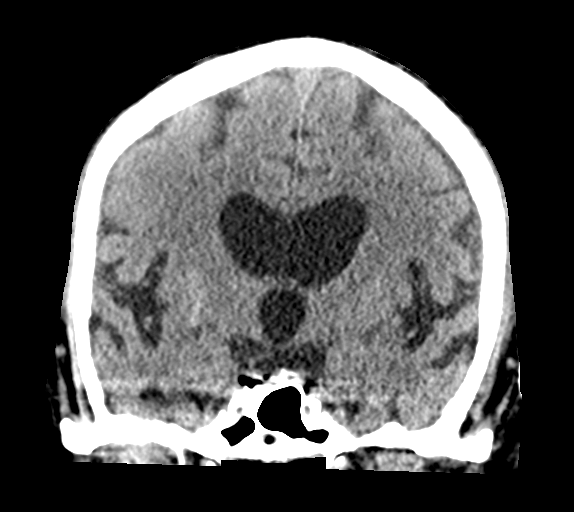
[im 37/67  brain]
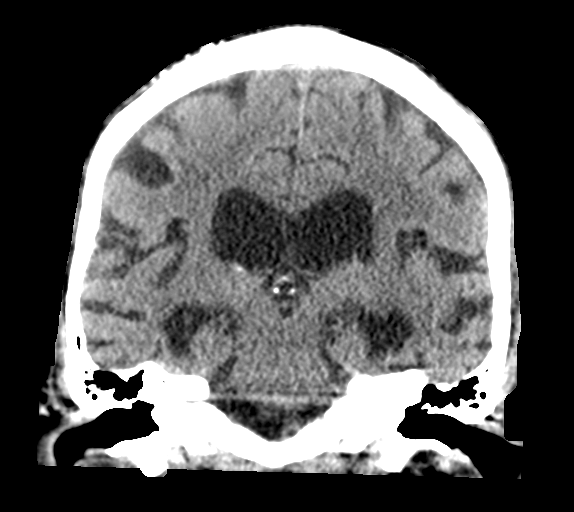

[Series 6: sagittal soft tissue · sagittal · 0.31mm/px · 3 of 52 slices shown]
[im 18/52  brain]
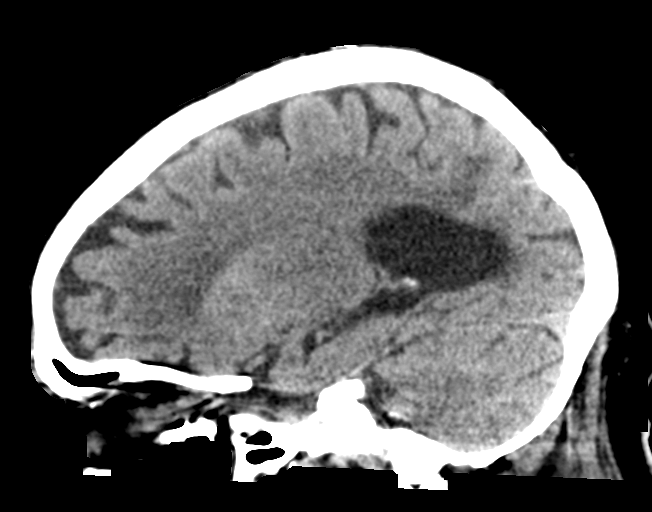
[im 26/52  brain]
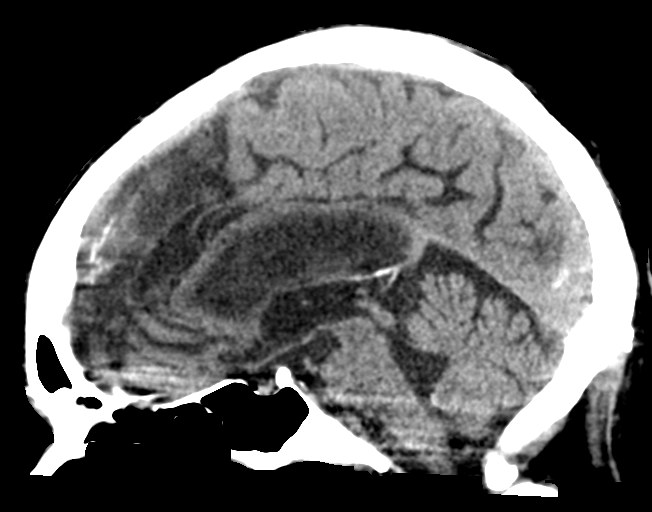
[im 35/52  brain]
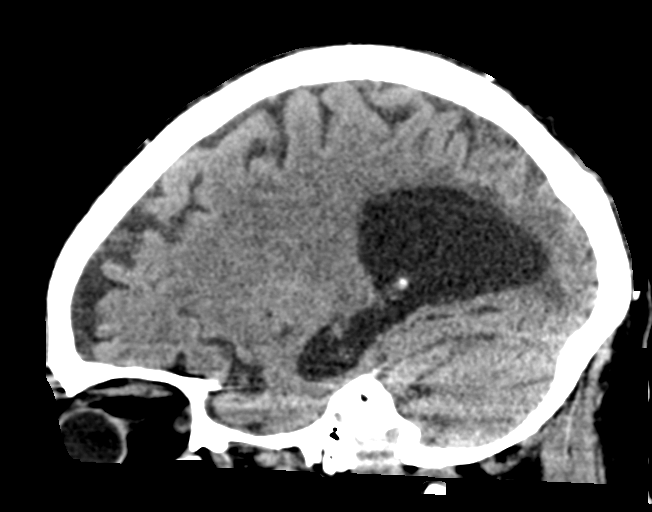

[15 of 47 positions shown; findings below may reference images not displayed]

FINDINGS: Brain: Old left posterior parietal infarct. There is atrophy and
chronic small vessel disease changes. No acute intracranial
abnormality. Specifically, no hemorrhage, hydrocephalus, mass
lesion, acute infarction, or significant intracranial injury.

Vascular: No hyperdense vessel or unexpected calcification.

Skull: No acute calvarial abnormality.

Sinuses/Orbits: Visualized paranasal sinuses and mastoids clear.
Orbital soft tissues unremarkable.

Other: None
IMPRESSION: Old left posterior parietal infarct.

Atrophy, chronic microvascular disease.

No acute intracranial abnormality.
